# Patient Record
Sex: Female | Born: 1965 | Race: White | Hispanic: No | Marital: Married
Health system: Southern US, Community
[De-identification: ages and names within clinical notes are randomized; demographics above are authoritative.]

## PROBLEM LIST (undated history)

## (undated) DIAGNOSIS — E785 Hyperlipidemia, unspecified: Secondary | ICD-10-CM

## (undated) DIAGNOSIS — R42 Dizziness and giddiness: Secondary | ICD-10-CM

## (undated) DIAGNOSIS — R519 Headache, unspecified: Secondary | ICD-10-CM

## (undated) DIAGNOSIS — M797 Fibromyalgia: Secondary | ICD-10-CM

## (undated) DIAGNOSIS — K589 Irritable bowel syndrome without diarrhea: Secondary | ICD-10-CM

## (undated) DIAGNOSIS — G4700 Insomnia, unspecified: Secondary | ICD-10-CM

## (undated) DIAGNOSIS — I10 Essential (primary) hypertension: Secondary | ICD-10-CM

## (undated) DIAGNOSIS — F419 Anxiety disorder, unspecified: Secondary | ICD-10-CM

## (undated) DIAGNOSIS — J449 Chronic obstructive pulmonary disease, unspecified: Secondary | ICD-10-CM

## (undated) HISTORY — PX: COLONOSCOPY: SHX5424

---

## 1999-04-05 HISTORY — PX: OTHER SURGICAL HISTORY: SHX169

## 2001-02-04 HISTORY — PX: CHOLECYSTECTOMY: SHX55

## 2001-02-04 HISTORY — PX: LAPAROSCOPY: SHX197

## 2019-06-27 DIAGNOSIS — I639 Cerebral infarction, unspecified: Secondary | ICD-10-CM

## 2019-06-27 HISTORY — DX: Cerebral infarction, unspecified: I63.9

## 2019-07-02 ENCOUNTER — Observation Stay (HOSPITAL_COMMUNITY)
Admission: EM | Admit: 2019-07-02 | Discharge: 2019-07-03 | Disposition: A | Discharge status: Home / Self Care | Payer: BC Managed Care – PPO | Attending: Internal Medicine | Admitting: Internal Medicine

## 2019-07-02 ENCOUNTER — Encounter (HOSPITAL_COMMUNITY): Payer: Self-pay | Admitting: Emergency Medicine

## 2019-07-02 ENCOUNTER — Emergency Department (HOSPITAL_COMMUNITY): Payer: BC Managed Care – PPO

## 2019-07-02 ENCOUNTER — Other Ambulatory Visit: Payer: Self-pay

## 2019-07-02 DIAGNOSIS — R55 Syncope and collapse: Secondary | ICD-10-CM

## 2019-07-02 DIAGNOSIS — R079 Chest pain, unspecified: Secondary | ICD-10-CM | POA: Insufficient documentation

## 2019-07-02 DIAGNOSIS — R29898 Other symptoms and signs involving the musculoskeletal system: Secondary | ICD-10-CM

## 2019-07-02 DIAGNOSIS — Z7722 Contact with and (suspected) exposure to environmental tobacco smoke (acute) (chronic): Secondary | ICD-10-CM

## 2019-07-02 DIAGNOSIS — I7774 Dissection of vertebral artery: Principal | ICD-10-CM

## 2019-07-02 DIAGNOSIS — Z20822 Contact with and (suspected) exposure to covid-19: Secondary | ICD-10-CM | POA: Diagnosis not present

## 2019-07-02 DIAGNOSIS — I639 Cerebral infarction, unspecified: Secondary | ICD-10-CM

## 2019-07-02 HISTORY — DX: Fibromyalgia: M79.7

## 2019-07-02 HISTORY — DX: Irritable bowel syndrome, unspecified: K58.9

## 2019-07-02 LAB — DIFFERENTIAL
Abs Immature Granulocytes: 0.01 10*3/uL (ref 0.00–0.07)
Basophils Absolute: 0.1 10*3/uL (ref 0.0–0.1)
Basophils Relative: 1 %
Eosinophils Absolute: 0.1 10*3/uL (ref 0.0–0.5)
Eosinophils Relative: 2 %
Immature Granulocytes: 0 %
Lymphocytes Relative: 38 %
Lymphs Abs: 2.2 10*3/uL (ref 0.7–4.0)
Monocytes Absolute: 0.5 10*3/uL (ref 0.1–1.0)
Monocytes Relative: 9 %
Neutro Abs: 3 10*3/uL (ref 1.7–7.7)
Neutrophils Relative %: 50 %

## 2019-07-02 LAB — COMPREHENSIVE METABOLIC PANEL
ALT: 32 U/L (ref 0–44)
AST: 28 U/L (ref 15–41)
Albumin: 4.3 g/dL (ref 3.5–5.0)
Alkaline Phosphatase: 79 U/L (ref 38–126)
Anion gap: 11 (ref 5–15)
BUN: 5 mg/dL — ABNORMAL LOW (ref 6–20)
CO2: 21 mmol/L — ABNORMAL LOW (ref 22–32)
Calcium: 9.4 mg/dL (ref 8.9–10.3)
Chloride: 107 mmol/L (ref 98–111)
Creatinine, Ser: 0.54 mg/dL (ref 0.44–1.00)
GFR calc Af Amer: >60 mL/min (ref >60)
GFR calc non Af Amer: >60 mL/min (ref >60)
Glucose, Bld: 90 mg/dL (ref 70–99)
Potassium: 3.7 mmol/L (ref 3.5–5.1)
Sodium: 139 mmol/L (ref 135–145)
Total Bilirubin: 1.4 mg/dL — ABNORMAL HIGH (ref 0.3–1.2)
Total Protein: 7.7 g/dL (ref 6.5–8.1)

## 2019-07-02 LAB — TROPONIN I (HIGH SENSITIVITY)
Troponin I (High Sensitivity): 2 ng/L (ref <18)
Troponin I (High Sensitivity): 3 ng/L (ref <18)

## 2019-07-02 LAB — CBC
HCT: 46.2 % — ABNORMAL HIGH (ref 36.0–46.0)
Hemoglobin: 15.4 g/dL — ABNORMAL HIGH (ref 12.0–15.0)
MCH: 30.4 pg (ref 26.0–34.0)
MCHC: 33.3 g/dL (ref 30.0–36.0)
MCV: 91.1 fL (ref 80.0–100.0)
Platelets: 391 10*3/uL (ref 150–400)
RBC: 5.07 MIL/uL (ref 3.87–5.11)
RDW: 11.7 % (ref 11.5–15.5)
WBC: 5.9 10*3/uL (ref 4.0–10.5)
nRBC: 0 % (ref 0.0–0.2)

## 2019-07-02 LAB — HCG, SERUM, QUALITATIVE: Preg, Serum: NEGATIVE

## 2019-07-02 LAB — I-STAT CHEM 8, ED
BUN: 5 mg/dL — ABNORMAL LOW (ref 6–20)
Calcium, Ion: 1.17 mmol/L (ref 1.15–1.40)
Chloride: 107 mmol/L (ref 98–111)
Creatinine, Ser: 0.5 mg/dL (ref 0.44–1.00)
Glucose, Bld: 90 mg/dL (ref 70–99)
HCT: 47 % — ABNORMAL HIGH (ref 36.0–46.0)
Hemoglobin: 16 g/dL — ABNORMAL HIGH (ref 12.0–15.0)
Potassium: 3.6 mmol/L (ref 3.5–5.1)
Sodium: 142 mmol/L (ref 135–145)
TCO2: 21 mmol/L — ABNORMAL LOW (ref 22–32)

## 2019-07-02 LAB — APTT: aPTT: 36 seconds (ref 24–36)

## 2019-07-02 LAB — PROTIME-INR
INR: 1 (ref 0.8–1.2)
Prothrombin Time: 12.6 seconds (ref 11.4–15.2)

## 2019-07-02 LAB — I-STAT BETA HCG BLOOD, ED (MC, WL, AP ONLY): I-stat hCG, quantitative: 5.1 m[IU]/mL — ABNORMAL HIGH (ref <5)

## 2019-07-02 LAB — HIV ANTIBODY (ROUTINE TESTING W REFLEX): HIV Screen 4th Generation wRfx: NONREACTIVE

## 2019-07-02 LAB — SARS CORONAVIRUS 2 BY RT PCR (HOSPITAL ORDER, PERFORMED IN ~~LOC~~ HOSPITAL LAB): SARS Coronavirus 2: NEGATIVE

## 2019-07-02 MED ORDER — METOCLOPRAMIDE HCL 5 MG/ML IJ SOLN
10.0000 mg | Freq: Once | INTRAMUSCULAR | Status: AC
  Administered 2019-07-02: 10 mg via INTRAVENOUS
  Filled 2019-07-02: qty 2

## 2019-07-02 MED ORDER — SODIUM CHLORIDE 0.9 % IV BOLUS
1000.0000 mL | Freq: Once | INTRAVENOUS | Status: AC
  Administered 2019-07-02: 1000 mL via INTRAVENOUS

## 2019-07-02 MED ORDER — CLOPIDOGREL BISULFATE 75 MG PO TABS
75.0000 mg | ORAL_TABLET | Freq: Every day | ORAL | Status: DC
  Administered 2019-07-03: 75 mg via ORAL
  Filled 2019-07-02: qty 1

## 2019-07-02 MED ORDER — ENOXAPARIN SODIUM 40 MG/0.4ML ~~LOC~~ SOLN
40.0000 mg | SUBCUTANEOUS | Status: DC
  Administered 2019-07-02: 40 mg via SUBCUTANEOUS
  Filled 2019-07-02: qty 0.4

## 2019-07-02 MED ORDER — DIPHENHYDRAMINE HCL 50 MG/ML IJ SOLN
25.0000 mg | Freq: Once | INTRAMUSCULAR | Status: AC
  Administered 2019-07-02: 25 mg via INTRAVENOUS
  Filled 2019-07-02: qty 1

## 2019-07-02 MED ORDER — STROKE: EARLY STAGES OF RECOVERY BOOK
Freq: Once | Status: AC
  Administered 2019-07-02: 1
  Filled 2019-07-02: qty 1

## 2019-07-02 MED ORDER — IOHEXOL 350 MG/ML SOLN
75.0000 mL | Freq: Once | INTRAVENOUS | Status: AC | PRN
  Administered 2019-07-02: 75 mL via INTRAVENOUS

## 2019-07-02 MED ORDER — CLOPIDOGREL BISULFATE 75 MG PO TABS
300.0000 mg | ORAL_TABLET | Freq: Once | ORAL | Status: AC
  Administered 2019-07-02: 300 mg via ORAL
  Filled 2019-07-02: qty 4

## 2019-07-02 MED ORDER — ACETAMINOPHEN 160 MG/5ML PO SOLN: 650.0000 mg | ORAL | Status: DC | PRN

## 2019-07-02 MED ORDER — SODIUM CHLORIDE 0.9% FLUSH: 3.0000 mL | Freq: Once | INTRAVENOUS | Status: DC

## 2019-07-02 MED ORDER — ACETAMINOPHEN 650 MG RE SUPP: 650.0000 mg | RECTAL | Status: DC | PRN

## 2019-07-02 MED ORDER — SENNOSIDES-DOCUSATE SODIUM 8.6-50 MG PO TABS: 1.0000 | ORAL_TABLET | Freq: Every evening | ORAL | Status: DC | PRN

## 2019-07-02 MED ORDER — ACETAMINOPHEN 325 MG PO TABS: 650.0000 mg | ORAL_TABLET | ORAL | Status: DC | PRN

## 2019-07-02 MED ORDER — ASPIRIN EC 81 MG PO TBEC
81.0000 mg | DELAYED_RELEASE_TABLET | Freq: Every day | ORAL | Status: DC
  Administered 2019-07-02 – 2019-07-03 (×2): 81 mg via ORAL
  Filled 2019-07-02 (×2): qty 1

## 2019-07-02 MED ORDER — SODIUM CHLORIDE 0.9 % IV SOLN: INTRAVENOUS | Status: DC

## 2019-07-02 NOTE — H&P (Signed)
History and Physical    Alexys Lobello LZJ:[id number] DOB: 1965-05-30 DOA: 07/02/2019  Referring MD/NP/PA: Kennis Carina, MD PCP: Patient, No Pcp Per  Patient coming from: Home  Chief Complaint: Left-sided tingling and weakness  I have personally briefly reviewed patient's old medical records in [facility]   HPI: Kimberly [name] is a 54 y.o. female with medical history significant of fibromyalgia and  IBS presented with complaints of left sided tingling and weakness.  Yesterday while the patient was running at work she reports blacking out and almost falling.  She was able to catch her self before falling, and denies completely losing consciousness.  Since that time she is complaining of having a headache and had a funny tingling sensation on the left side of her face going all the way down to her feet.  Associated symptoms of feeling as though her speech was temporarly slurred, metallic taste in her mouth, chest pains(states more so chronic), and some difficulty walking due to weakness on the left side.   ED Course: In the emergency department patient was noted to have developed 254/104.  Labs significant for hemoglobin 15.4, troponin negative x2, total bilirubin 1.4, but all other labs relatively within normal limits.  Initial CT scan of the head without contrast was negative for any signs of a stroke.  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  Neurology have been consulted and loaded the patient with Plavix.  MRI of the brain was pending.  TRH called to admit.   Review of Systems  Constitutional: Negative for fever.  HENT: Positive for nosebleeds. Negative for ear discharge.   Eyes: Negative for photophobia and pain.  Respiratory: Negative for cough and shortness of breath.   Cardiovascular: Positive for chest pain. Negative for leg swelling.  Gastrointestinal: Negative for abdominal pain,  diarrhea and vomiting.  Genitourinary: Negative for dysuria and hematuria.  Musculoskeletal: Negative for falls and myalgias.  Skin: Negative for itching.  Neurological: Positive for sensory change, speech change, focal weakness and headaches. Negative for loss of consciousness.  Psychiatric/Behavioral: Negative for substance abuse.    Past Medical History:  Diagnosis Date  . Fibromyalgia   . IBS (irritable bowel syndrome)     History reviewed. No pertinent surgical history.   reports that she is a non-smoker but has been exposed to tobacco smoke. She has never used smokeless tobacco. She reports previous alcohol use. She reports that she does not use drugs.  No Known Allergies  History reviewed. No pertinent family history.  Prior to Admission medications   Not on File    Physical Exam:  Constitutional: NAD, calm, comfortable Vitals:   07/02/19 1059 07/02/19 1230 07/02/19 1315  BP: (!) 154/103 (!) 154/104 (!) 153/105  Pulse: 76 77 67  Resp: 16 10 16   Temp: 98.4 F (36.9 C)    TempSrc: Oral    SpO2: 98% 99% 99%  Weight: 83 kg    Height: 5\' 6"  (1.676 m)     Eyes: PERRL, lids and conjunctivae normal ENMT: Mucous membranes are moist. Posterior pharynx clear of any exudate or lesions. Neck: normal, supple, no masses, no thyromegaly Respiratory: clear to auscultation bilaterally, no wheezing, no crackles. Normal respiratory effort. No accessory muscle use.  Cardiovascular: Regular rate and rhythm, no murmurs / rubs / gallops. No extremity edema. 2+ pedal pulses. No carotid bruits.  Abdomen: no tenderness, no masses palpated. No hepatosplenomegaly. Bowel sounds  positive.  Musculoskeletal: no clubbing / cyanosis. No joint deformity upper and lower extremities. Good ROM, no contractures. Normal muscle tone.  Skin: no rashes, lesions, ulcers. No induration Neurologic: CN 2-12 grossly intact. Strength 4/5 on the left upper and lower extremity Psychiatric: Normal judgment and  insight. Alert and oriented x 3. Normal mood.     Labs on Admission: I have personally reviewed following labs and imaging studies  CBC: Recent Labs  Lab 07/02/19 1110 07/02/19 1140  WBC 5.9  --   NEUTROABS 3.0  --   HGB 15.4* 16.0*  HCT 46.2* 47.0*  MCV 91.1  --   PLT 391  --    Basic Metabolic Panel: Recent Labs  Lab 07/02/19 1110 07/02/19 1140  NA 139 142  K 3.7 3.6  CL 107 107  CO2 21*  --   GLUCOSE 90 90  BUN 5* 5*  CREATININE 0.54 0.50  CALCIUM 9.4  --    GFR: Estimated Creatinine Clearance: 88.3 mL/min (by C-G formula based on SCr of 0.5 mg/dL). Liver Function Tests: Recent Labs  Lab 07/02/19 1110  AST 28  ALT 32  ALKPHOS 79  BILITOT 1.4*  PROT 7.7  ALBUMIN 4.3   No results for input(s): LIPASE, AMYLASE in the last 168 hours. No results for input(s): AMMONIA in the last 168 hours. Coagulation Profile: Recent Labs  Lab 07/02/19 1110  INR 1.0   Cardiac Enzymes: No results for input(s): CKTOTAL, CKMB, CKMBINDEX, TROPONINI in the last 168 hours. BNP (last 3 results) No results for input(s): PROBNP in the last 8760 hours. HbA1C: No results for input(s): HGBA1C in the last 72 hours. CBG: No results for input(s): GLUCAP in the last 168 hours. Lipid Profile: No results for input(s): CHOL, HDL, LDLCALC, TRIG, CHOLHDL, LDLDIRECT in the last 72 hours. Thyroid Function Tests: No results for input(s): TSH, T4TOTAL, FREET4, T3FREE, THYROIDAB in the last 72 hours. Anemia Panel: No results for input(s): VITAMINB12, FOLATE, FERRITIN, TIBC, IRON, RETICCTPCT in the last 72 hours. Urine analysis: No results found for: COLORURINE, APPEARANCEUR, LABSPEC, PHURINE, GLUCOSEU, HGBUR, BILIRUBINUR, KETONESUR, PROTEINUR, UROBILINOGEN, NITRITE, LEUKOCYTESUR Sepsis Labs: No results found for this or any previous visit (from the past 240 hour(s)).   Radiological Exams on Admission: CT Angio Head W or Wo Contrast  Result Date: 07/02/2019 CLINICAL DATA:  Syncopal  episode, left-sided weakness EXAM: CT ANGIOGRAPHY HEAD AND NECK TECHNIQUE: Multiplanar CT image reconstructions and MIPs were obtained to evaluate the vascular anatomy. Carotid stenosis measurements (when applicable) are obtained utilizing NASCET criteria, using the distal internal carotid diameter as the denominator. CONTRAST:  34mL OMNIPAQUE IOHEXOL 350 MG/ML SOLN COMPARISON:  None. FINDINGS: CTA NECK Aortic arch: Great vessel origins are patent. Right carotid system: Patent. No measurable stenosis or evidence of dissection. Left carotid system: Patent. No measurable stenosis or evidence of dissection. Vertebral arteries: Patent. Left vertebral artery is dominant. Short segment of right V3 vertebral artery demonstrates a linear filling defect at the level of the right C1 transverse foramen. This is not extend intracranially. Skeleton: Degenerative changes of the cervical spine primarily at C5-C6. Other neck: No mass or adenopathy. Upper chest: No apical lung mass. Review of the MIP images confirms the above findings bero CTA HEAD Anterior circulation: Intracranial internal carotid arteries are patent. Anterior and middle cerebral arteries are patent. Posterior circulation: Intracranial vertebral arteries, basilar artery, and posterior cerebral arteries are patent. Venous sinuses: Patent as allowed by contrast bolus timing. 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 MRI recommended to exclude acute infarct. These results were called by telephone at the time of interpretation on 07/02/2019 at 4:28 pm to provider Dr. Pilar Plate, Who verbally acknowledged these results. Electronically Signed   By: Guadlupe Spanish M.D.   On: 07/02/2019 16:28   CT HEAD WO CONTRAST  Result Date: 07/02/2019 CLINICAL DATA:  Syncope with headache and nausea EXAM: CT HEAD WITHOUT CONTRAST TECHNIQUE: Contiguous axial images  were obtained from the base of the skull through the vertex without intravenous contrast. COMPARISON:  May 19, 2016 FINDINGS: Brain: The ventricles and sulci are normal in size and configuration. There is no intracranial mass, hemorrhage, extra-axial fluid collection, or midline shift. Brain parenchyma appears unremarkable. No evident acute infarct. Vascular: No hyperdense vessel.  No evident vascular calcification. Skull: Bony calvarium appears intact. Sinuses/Orbits: There is mucosal thickening in several ethmoid air cells. Other visualized paranasal sinuses are clear. Orbits appear symmetric bilaterally. Other: Mastoid air cells are clear. There is mild debris in the right external auditory canal. IMPRESSION: Brain parenchyma appears unremarkable.  No mass or hemorrhage. Mucosal thickening noted in several ethmoid air cells. Probable cerumen in the right external auditory canal. Electronically Signed   By: Bretta Bang III M.D.   On: 07/02/2019 12:35   CT Angio Neck W and/or Wo Contrast  Result Date: 07/02/2019 CLINICAL DATA:  Syncopal episode, left-sided weakness EXAM: CT ANGIOGRAPHY HEAD AND NECK TECHNIQUE: Multiplanar CT image reconstructions and MIPs were obtained to evaluate the vascular anatomy. Carotid stenosis measurements (when applicable) are obtained utilizing NASCET criteria, using the distal internal carotid diameter as the denominator. CONTRAST:  58mL OMNIPAQUE IOHEXOL 350 MG/ML SOLN COMPARISON:  None. FINDINGS: CTA NECK Aortic arch: Great vessel origins are patent. Right carotid system: Patent. No measurable stenosis or evidence of dissection. Left carotid system: Patent. No measurable stenosis or evidence of dissection. Vertebral arteries: Patent. Left vertebral artery is dominant. Short segment of right V3 vertebral artery demonstrates a linear filling defect at the level of the right C1 transverse foramen. This is not extend intracranially. Skeleton: Degenerative changes of the  cervical spine primarily at C5-C6. Other neck: No mass or adenopathy. Upper chest: No apical lung mass. Review of the MIP images confirms the above findings bero CTA HEAD Anterior circulation: Intracranial internal carotid arteries are patent. Anterior and middle cerebral arteries are patent. Posterior circulation: Intracranial vertebral arteries, basilar artery, and posterior cerebral arteries are patent. Venous sinuses: Patent as allowed by contrast bolus timing. 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 MRI recommended to exclude acute infarct. These results were called by telephone at the time of interpretation on 07/02/2019 at 4:28 pm to provider Dr. Pilar Plate, Who verbally acknowledged these results. Electronically Signed   By: Guadlupe Spanish M.D.   On: 07/02/2019 16:28   DG Chest Portable 1 View  Result Date: 07/02/2019 CLINICAL DATA:  Chest pain. Near syncope. EXAM: PORTABLE CHEST 1 VIEW COMPARISON:  05/19/2016 FINDINGS: The cardiac silhouette remains near the upper limit of normal in size. The lungs remain mildly hyperexpanded with mild peribronchial thickening. Interval minimal left basilar atelectasis. Unremarkable bones. IMPRESSION: 1. Interval minimal left basilar atelectasis. 2. Stable mild changes of COPD. Electronically Signed   By: Beckie Salts M.D.   On: 07/02/2019 13:25    EKG: Independently reviewed.  Normal sinus rhythm at  80 bpm  Assessment/Plan Vertebral artery dissection:Acute. Patient presented with complaints of headache, left sided numbness, and weakness. Found to have a vertebral dissection at the level of C1 without intracranial extension. MRI negative for any signs of an acute stroke. -Admit to a medical telemetry bed -Neurochecks -Dual antiplatelet therapy -PT/OT to evaluate and treat -Appreciate Neurology will follow-up for any further recommendations     Near syncope: Patient denied any loss of consciousness. -F/u telemetry overnight  Chest pain: Chronic.Troponin negative x2 and ekg without ischemic changes.  Passive smoke exposure: Patient denies history of smoking, but has second hand exposure from husband. Chest xray note mild changes suggest of COPD.  DVT prophylaxis: lovenox Code Status: Full Family Communication:husband up dated at bedside Disposition Plan: Possible discharge home Consults called: Neurology Admission status: Observation  Norval Morton MD [facility] Pager [phone]   If 7PM-7AM, please contact night-coverage [email or web address] Password [facility]  07/02/2019, 6:08 PM

## 2019-07-02 NOTE — ED Provider Notes (Signed)
  Provider Note MRN:  [id number]  Arrival date & time: 07/02/19    ED Course and Medical Decision Making  Assumed care from Dr. Criss Alvine at shift change.  Left arm drift and decreased sensation, CTA imaging revealing vertebral artery dissection on the right.  Consulted neurology, who has evaluated the patient and is providing Plavix, recommending MRI.  Admitted to hospitalist service for further management.  .Critical Care Performed by: Sabas Sous, MD Authorized by: Sabas Sous, MD   Critical care provider statement:    Critical care time (minutes):  35   Critical care was necessary to treat or prevent imminent or life-threatening deterioration of the following conditions: Vertebral artery dissection, concern for acute ischemic stroke.   Critical care was time spent personally by me on the following activities:  Discussions with consultants, evaluation of patient's response to treatment, examination of patient, ordering and performing treatments and interventions, ordering and review of laboratory studies, ordering and review of radiographic studies, pulse oximetry, re-evaluation of patient's condition, obtaining history from patient or surrogate and review of old charts    Final Clinical Impressions(s) / ED Diagnoses     ICD-10-CM   1. Vertebral artery dissection (HCC)  I77.74   2. Acute ischemic stroke (HCC)  I63.9   3. Left arm weakness  R29.898     ED Discharge Orders    None      Discharge Instructions   None     Elmer Sow. Pilar Plate, MD [facility] Emergency Medicine [facility] [email or web address]    Sabas Sous, MD 07/02/19 1816

## 2019-07-02 NOTE — ED Triage Notes (Signed)
Pt states that at work yesterday around 1130 am she had syncopal episode, states that upon waking back up she had a metallic taste in her mouth with some numbness to her tongue and the L side of her face, also endorses pain to L arm and L leg as well as weakness to L arm and L leg. Pt a/ox4, speech clear at this time. Denies visual disturbances. Seen at urgent care where she had weakness on the L side and was sent here for further eval. Grip strength weaker on L side.

## 2019-07-02 NOTE — ED Provider Notes (Addendum)
[facility] EMERGENCY DEPARTMENT Provider Note   CSN: [id number] Arrival date & time: 07/02/19  1056     History Chief Complaint  Patient presents with  . Loss of Consciousness  . Numbness    Kimberly [name] is a 54 y.o. female.  HPI 54 year old female presents with left-sided weakness.  She was at work yesterday where she was standing and on the phone and all of a sudden felt dizzy and fell over on her left side.  Did not hit her head or lose consciousness but she states she was close.  Since that time she has noticed a headache that is at the top of her head.  She has had headaches like this before but never like this scenario.  She also has left-sided weakness including her left arm and leg.  It is diffusely tingly.  She has chronic recurrent chest pain that she states she also had yesterday and today but is not new or worse.  Is like a sharp pain.  Her left face including her cheek and tongue feel numb as well.  She has a metallic taste in her mouth. Pain in chest is about 5/10 and 7/10 in her head.   Past Medical History:  Diagnosis Date  . Fibromyalgia   . IBS (irritable bowel syndrome)     There are no problems to display for this patient.   History reviewed. No pertinent surgical history.   OB History   No obstetric history on file.     No family history on file.  Social History   Tobacco Use  . Smoking status: Not on file  Substance Use Topics  . Alcohol use: Not on file  . Drug use: Not on file    Home Medications Prior to Admission medications   Not on File    Allergies    Patient has no known allergies.  Review of Systems   Review of Systems  Constitutional: Negative for fever.  Cardiovascular: Positive for chest pain.  Gastrointestinal: Positive for nausea. Negative for abdominal pain and vomiting.  Neurological: Positive for dizziness, weakness, numbness and headaches. Negative for syncope.  All other systems reviewed and are  negative.   Physical Exam Updated Vital Signs BP (!) 153/105   Pulse 67   Temp 98.4 F (36.9 C) (Oral)   Resp 16   Ht 5\' 6"  (1.676 m)   Wt 83 kg   SpO2 99%   BMI 29.54 kg/m   Physical Exam Vitals and nursing note reviewed.  Constitutional:      Appearance: She is well-developed.  HENT:     Head: Normocephalic and atraumatic.     Right Ear: External ear normal.     Left Ear: External ear normal.     Nose: Nose normal.  Eyes:     General:        Right eye: No discharge.        Left eye: No discharge.     Extraocular Movements: Extraocular movements intact.     Pupils: Pupils are equal, round, and reactive to light.  Cardiovascular:     Rate and Rhythm: Normal rate and regular rhythm.     Pulses:          Radial pulses are 2+ on the right side and 2+ on the left side.     Heart sounds: Normal heart sounds.  Pulmonary:     Effort: Pulmonary effort is normal.     Breath sounds: Normal breath sounds.  Abdominal:     Palpations: Abdomen is soft.     Tenderness: There is no abdominal tenderness.  Skin:    General: Skin is warm and dry.  Neurological:     Mental Status: She is alert.     Comments: CN 3-12 grossly intact save for decreased sensation over left cheek. 5/5 strength in right upper and lower extremities. 4/5 strength in LUE, LLE.  Diffusely decreased subjective sensation in left arm and leg compared to right  Psychiatric:        Mood and Affect: Mood is not anxious.     ED Results / Procedures / Treatments   Labs (all labs ordered are listed, but only abnormal results are displayed) Labs Reviewed  CBC - Abnormal; Notable for the following components:      Result Value   Hemoglobin 15.4 (*)    HCT 46.2 (*)    All other components within normal limits  COMPREHENSIVE METABOLIC PANEL - Abnormal; Notable for the following components:   CO2 21 (*)    BUN 5 (*)    Total Bilirubin 1.4 (*)    All other components within normal limits  I-STAT CHEM 8, ED -  Abnormal; Notable for the following components:   BUN 5 (*)    TCO2 21 (*)    Hemoglobin 16.0 (*)    HCT 47.0 (*)    All other components within normal limits  I-STAT BETA HCG BLOOD, ED ([facility], [facility], [facility] ONLY) - Abnormal; Notable for the following components:   I-stat hCG, quantitative 5.1 (*)    All other components within normal limits  PROTIME-INR  APTT  DIFFERENTIAL  TROPONIN I (HIGH SENSITIVITY)  TROPONIN I (HIGH SENSITIVITY)    EKG EKG Interpretation  Date/Time:  Friday Jul 02 2019 11:00:29 EDT Ventricular Rate:  80 PR Interval:  170 QRS Duration: 82 QT Interval:  398 QTC Calculation: 459 R Axis:   17 Text Interpretation: Normal sinus rhythm no acute ST/T changes No old tracing to compare Confirmed by Pricilla Loveless [phone]) on 07/02/2019 12:02:25 PM   Radiology CT HEAD WO CONTRAST  Result Date: 07/02/2019 CLINICAL DATA:  Syncope with headache and nausea EXAM: CT HEAD WITHOUT CONTRAST TECHNIQUE: Contiguous axial images were obtained from the base of the skull through the vertex without intravenous contrast. COMPARISON:  May 19, 2016 FINDINGS: Brain: The ventricles and sulci are normal in size and configuration. There is no intracranial mass, hemorrhage, extra-axial fluid collection, or midline shift. Brain parenchyma appears unremarkable. No evident acute infarct. Vascular: No hyperdense vessel.  No evident vascular calcification. Skull: Bony calvarium appears intact. Sinuses/Orbits: There is mucosal thickening in several ethmoid air cells. Other visualized paranasal sinuses are clear. Orbits appear symmetric bilaterally. Other: Mastoid air cells are clear. There is mild debris in the right external auditory canal. IMPRESSION: Brain parenchyma appears unremarkable.  No mass or hemorrhage. Mucosal thickening noted in several ethmoid air cells. Probable cerumen in the right external auditory canal. Electronically Signed   By: Bretta Bang III M.D.   On: 07/02/2019 12:35   DG  Chest Portable 1 View  Result Date: 07/02/2019 CLINICAL DATA:  Chest pain. Near syncope. EXAM: PORTABLE CHEST 1 VIEW COMPARISON:  05/19/2016 FINDINGS: The cardiac silhouette remains near the upper limit of normal in size. The lungs remain mildly hyperexpanded with mild peribronchial thickening. Interval minimal left basilar atelectasis. Unremarkable bones. IMPRESSION: 1. Interval minimal left basilar atelectasis. 2. Stable mild changes of COPD. Electronically Signed   By: Viviann Spare  Joneen Caraway M.D.   On: 07/02/2019 13:25    Procedures Procedures (including critical care time)  Medications Ordered in ED Medications  sodium chloride flush (NS) 0.9 % injection 3 mL (3 mLs Intravenous Not Given 07/02/19 1255)  sodium chloride 0.9 % bolus 1,000 mL (1,000 mLs Intravenous New Bag/Given 07/02/19 1255)  metoCLOPramide (REGLAN) injection 10 mg (10 mg Intravenous Given 07/02/19 1255)  diphenhydrAMINE (BENADRYL) injection 25 mg (25 mg Intravenous Given 07/02/19 1255)    ED Course  I have reviewed the triage vital signs and the nursing notes.  Pertinent labs & imaging results that were available during my care of the patient were reviewed by me and considered in my medical decision making (see chart for details).    MDM Rules/Calculators/A&P                      Exam seems somewhat inconsistent.  There is a little bit of drift on her left upper extremity.  Unclear if this is headache related or a stroke.  Given the near syncope associated with this, will get CT angiography which should get her upper aorta and rule out dissection.  Chest pain is pretty minimal.  Troponin is negative.  Labs otherwise reviewed and are reassuring.  Initial head CT is negative.  The CT angiography has been delayed, if this is unrevealing, she will probably need MRI.  She does have a headache but there was no thunderclap component.  Has had many prior headaches that feels similar to this though with no neuro symptoms.  My suspicion of  subarachnoid hemorrhage is pretty low.  Care transferred to Dr. Sedonia Small. Final Clinical Impression(s) / ED Diagnoses Final diagnoses:  None    Rx / DC Orders ED Discharge Orders    None       Sherwood Gambler, MD 07/02/19 1542    Sherwood Gambler, MD 07/02/19 [phone]

## 2019-07-02 NOTE — ED Notes (Signed)
Patient transported to CT 

## 2019-07-03 ENCOUNTER — Observation Stay (HOSPITAL_BASED_OUTPATIENT_CLINIC_OR_DEPARTMENT_OTHER): Payer: BC Managed Care – PPO

## 2019-07-03 DIAGNOSIS — I5031 Acute diastolic (congestive) heart failure: Secondary | ICD-10-CM

## 2019-07-03 LAB — HEMOGLOBIN A1C
Hgb A1c MFr Bld: 5.3 % (ref 4.8–5.6)
Mean Plasma Glucose: 105.41 mg/dL

## 2019-07-03 LAB — LIPID PANEL
Cholesterol: 253 mg/dL — ABNORMAL HIGH (ref 0–200)
HDL: 51 mg/dL (ref >40)
LDL Cholesterol: 191 mg/dL — ABNORMAL HIGH (ref 0–99)
Total CHOL/HDL Ratio: 5 RATIO
Triglycerides: 53 mg/dL (ref <150)
VLDL: 11 mg/dL (ref 0–40)

## 2019-07-03 LAB — ECHOCARDIOGRAM COMPLETE
Height: 66 in
Weight: 2910.07 oz

## 2019-07-03 MED ORDER — ATORVASTATIN CALCIUM 80 MG PO TABS
80.0000 mg | ORAL_TABLET | Freq: Every day | ORAL | Status: DC
  Administered 2019-07-03: 80 mg via ORAL
  Filled 2019-07-03: qty 1

## 2019-07-03 MED ORDER — ATORVASTATIN CALCIUM 80 MG PO TABS: 80.0000 mg | ORAL_TABLET | Freq: Every day | ORAL | 1 refills | Status: DC

## 2019-07-03 MED ORDER — CLOPIDOGREL BISULFATE 75 MG PO TABS: 75.0000 mg | ORAL_TABLET | Freq: Every day | ORAL | 2 refills | Status: DC

## 2019-07-03 MED ORDER — ASPIRIN 81 MG PO TBEC: 81.0000 mg | DELAYED_RELEASE_TABLET | Freq: Every day | ORAL | 2 refills | Status: DC

## 2019-07-03 NOTE — Progress Notes (Signed)
Discharge instructions gone over with patient, and husband at bedside. All questions answered. IV removed, telemetry discontinued. Patient belongings returned to patient. Patient transported off unit via wheelchair for transport home. Melony Overly, RN

## 2019-07-03 NOTE — Discharge Summary (Signed)
Physician Discharge Summary  Kimberly [name] INO:[id number] DOB: [birth year] DOA: 07/02/2019  PCP: Patient, No Pcp Per  Admit date: 07/02/2019 Discharge date: 07/03/2019  Time spent: 45 minutes  Recommendations for Outpatient Follow-up:  Patient will be discharged to home.  Patient will need to follow up with primary care provider within one week of discharge.  Follow up with neurology. Patient should continue medications as prescribed.  Patient should follow a heart healthy diet.   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 Cecile Sheerer Kimberly [name] a 54 y.o.femalewith medical history significant offibromyalgiaandIBSpresented with complaints of left sided tingling and weakness. Yesterday while the patient was running at work she reports blacking out and almost falling. She was able to catch her self before falling, and denies completely losing consciousness. Since that time she is complaining of having a headache and had a funny tingling sensation on the left side of her face going all the way down to her feet. Associated symptoms of feeling as though her speech was temporarlyslurred, metallic taste in her mouth, chest pains(states more so chronic), andsome difficulty walking due to weakness on the left side.  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  recommended plavix + aspirin for 3 months and outpatient neuro follow up -Patient placed on aspirin, Plavix -LDL 191, hemoglobin A1c 5.3 -Echocardiogram EF 55-60%, LV diastolic parameters were normal -PT recommended outpatient PT -OT recommended no further follow up  Near syncope -Patient denied any loss of consciousness -Secondary to the above  Chest pain -?  Chronic -Troponin unremarkable x2 -EKG without ischemic changes -No chest pain this morning  Secondhand/passive smoke exposure -Patient denies history of smoking but does state that her husband smokes -Chest x-ray shows mild changes suggestive of COPD  Procedures: Echocardiogram  Consultations: Neurology   Discharge Exam: Vitals:   07/03/19 0733 07/03/19 1147  BP: 136/90 (!) 135/96  Pulse: 72 79  Resp: 18 18  Temp: 98.1 F (36.7 C) 97.8 F (36.6 C)  SpO2: 99% 97%     General: Well developed, well nourished, NAD, appears stated age  HEENT: NCAT, mucous membranes moist.  Cardiovascular: S1 S2 auscultated, RRR  Respiratory: Clear to auscultation bilaterally  Abdomen: Soft, nontender, nondistended, + bowel sounds  Extremities: warm dry without cyanosis clubbing or edema  Neuro: AAOx3, nonfocal  Psych: Appropriate mood and affect  Discharge Instructions Discharge Instructions    Ambulatory referral to Physical Therapy   Complete by: As directed    Discharge instructions   Complete by: As directed    Patient will be discharged to home.  Patient will need to follow up with primary care provider within one week of discharge.  Follow up with neurology. Patient should continue medications as prescribed.  Patient should follow a heart healthy diet.     Allergies as of 07/03/2019   No Known Allergies     Medication List    TAKE these medications   aspirin 81 MG EC tablet Take  1 tablet (81 mg total) by mouth daily. Start taking on: Jul 04, 2019   atorvastatin 80 MG tablet Commonly  known as: LIPITOR Take 1 tablet (80 mg total) by mouth daily. Start taking on: Jul 04, 2019   clopidogrel 75 MG tablet Commonly known as: PLAVIX Take 1 tablet (75 mg total) by mouth daily. Start taking on: Jul 04, 2019      No Known Allergies Follow-up Information    Primary care physician. Schedule an appointment as soon as possible for a visit in 1 week(s).   Why: Hospital follow up       Kimberly [name], [name], MD. Schedule an appointment as soon as possible for a visit in 3 week(s).   Specialties: Neurology, Radiology Why: Stroke clinic Contact information: [address] [place] [address] [phone]            The results of significant diagnostics from this hospitalization (including imaging, microbiology, ancillary and laboratory) are listed below for reference.    Significant Diagnostic Studies: CT Angio Head W or Wo Contrast  Result Date: 07/02/2019 CLINICAL DATA:  Syncopal episode, left-sided weakness EXAM: CT ANGIOGRAPHY HEAD AND NECK TECHNIQUE: Multiplanar CT image reconstructions and MIPs were obtained to evaluate the vascular anatomy. Carotid stenosis measurements (when applicable) are obtained utilizing NASCET criteria, using the distal internal carotid diameter as the denominator. CONTRAST:  75mL OMNIPAQUE IOHEXOL 350 MG/ML SOLN COMPARISON:  None. FINDINGS: CTA NECK Aortic arch: Great vessel origins are patent. Right carotid system: Patent. No measurable stenosis or evidence of dissection. Left carotid system: Patent. No measurable stenosis or evidence of dissection. Vertebral arteries: Patent. Left vertebral artery is dominant. Short segment of right V3 vertebral artery demonstrates a linear filling defect at the level of the right C1 transverse foramen. This is not extend intracranially. Skeleton: Degenerative changes of the cervical spine primarily at C5-C6. Other neck: No mass or adenopathy. Upper chest: No apical lung mass. Review of the MIP images  confirms the above findings bero CTA HEAD Anterior circulation: Intracranial internal carotid arteries are patent. Anterior and middle cerebral arteries are patent. Posterior circulation: Intracranial vertebral arteries, basilar artery, and posterior cerebral arteries are patent. Venous sinuses: Patent as allowed by contrast bolus timing. 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 MRI recommended to exclude acute infarct. These results were called by telephone at the time of interpretation on 07/02/2019 at 4:28 pm to provider Dr. Pilar PlateBero, Who verbally acknowledged these results. Electronically Signed   By: Guadlupe SpanishPraneil  Patel M.D.   On: 07/02/2019 16:28   CT HEAD WO CONTRAST  Result Date: 07/02/2019 CLINICAL DATA:  Syncope with headache and nausea EXAM: CT HEAD WITHOUT CONTRAST TECHNIQUE: Contiguous axial images were obtained from the base of the skull through the vertex without intravenous contrast. COMPARISON:  May 19, 2016 FINDINGS: Brain: The ventricles and sulci are normal in size and configuration. There is no intracranial mass, hemorrhage, extra-axial fluid collection, or midline shift. Brain parenchyma appears unremarkable. No evident acute infarct. Vascular: No hyperdense vessel.  No evident vascular calcification. Skull: Bony calvarium appears intact. Sinuses/Orbits: There is mucosal thickening in several ethmoid air cells. Other visualized paranasal sinuses are clear. Orbits appear symmetric bilaterally. Other: Mastoid air cells are clear. There is mild debris in the right external auditory canal. IMPRESSION: Brain parenchyma appears unremarkable.  No mass or hemorrhage. Mucosal thickening noted in several ethmoid air cells. Probable  cerumen in the right external auditory canal. Electronically Signed   By: Bretta Bang III M.D.   On: 07/02/2019 12:35   CT Angio Neck W and/or Wo  Contrast  Result Date: 07/02/2019 CLINICAL DATA:  Syncopal episode, left-sided weakness EXAM: CT ANGIOGRAPHY HEAD AND NECK TECHNIQUE: Multiplanar CT image reconstructions and MIPs were obtained to evaluate the vascular anatomy. Carotid stenosis measurements (when applicable) are obtained utilizing NASCET criteria, using the distal internal carotid diameter as the denominator. CONTRAST:  25mL OMNIPAQUE IOHEXOL 350 MG/ML SOLN COMPARISON:  None. FINDINGS: CTA NECK Aortic arch: Great vessel origins are patent. Right carotid system: Patent. No measurable stenosis or evidence of dissection. Left carotid system: Patent. No measurable stenosis or evidence of dissection. Vertebral arteries: Patent. Left vertebral artery is dominant. Short segment of right V3 vertebral artery demonstrates a linear filling defect at the level of the right C1 transverse foramen. This is not extend intracranially. Skeleton: Degenerative changes of the cervical spine primarily at C5-C6. Other neck: No mass or adenopathy. Upper chest: No apical lung mass. Review of the MIP images confirms the above findings bero CTA HEAD Anterior circulation: Intracranial internal carotid arteries are patent. Anterior and middle cerebral arteries are patent. Posterior circulation: Intracranial vertebral arteries, basilar artery, and posterior cerebral arteries are patent. Venous sinuses: Patent as allowed by contrast bolus timing. 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 MRI recommended to exclude acute infarct. These results were called by telephone at the time of interpretation on 07/02/2019 at 4:28 pm to provider Dr. Pilar Plate, Who verbally acknowledged these results. Electronically Signed   By: Guadlupe Spanish M.D.   On: 07/02/2019 16:28   MR BRAIN WO CONTRAST  Result Date: 07/02/2019 CLINICAL DATA:  Right vertebral artery dissection.  Rule out stroke left arm weakness. EXAM: MRI HEAD WITHOUT CONTRAST TECHNIQUE: Multiplanar, multiecho pulse sequences of the brain and surrounding structures were obtained without intravenous contrast. COMPARISON:  CTA head neck earlier today FINDINGS: Brain: Negative for acute infarct. Few small deep white matter hyperintensities bilaterally most likely chronic ischemia. Negative for hemorrhage or mass. Ventricle size and cerebral volume normal. Vascular: Normal arterial flow voids Skull and upper cervical spine: No focal skeletal lesion. Sinuses/Orbits: Mild mucosal edema paranasal sinuses. Negative orbit Other: None IMPRESSION: Negative for acute infarct. Mild chronic white matter changes most likely due to small vessel ischemia. Electronically Signed   By: Marlan Palau M.D.   On: 07/02/2019 18:18   DG Chest Portable 1 View  Result Date: 07/02/2019 CLINICAL DATA:  Chest pain. Near syncope. EXAM: PORTABLE CHEST 1 VIEW COMPARISON:  05/19/2016 FINDINGS: The cardiac silhouette remains near the upper limit of normal in size. The lungs remain mildly hyperexpanded with mild peribronchial thickening. Interval minimal left basilar atelectasis. Unremarkable bones. IMPRESSION: 1. Interval minimal left basilar atelectasis. 2. Stable mild changes of COPD. Electronically Signed   By: Beckie Salts M.D.   On: 07/02/2019 13:25   ECHOCARDIOGRAM COMPLETE  Result Date: 07/03/2019    ECHOCARDIOGRAM REPORT   Patient Name:   Kimberly [name] Date of Exam: 07/03/2019 Medical Rec #:  [id number]    Height:       66.0 in Accession #:    [id number]   Weight:       181.9 lb Date of Birth:  [birth year]    BSA:          1.921 m Patient Age:  53 years     BP:           136/90 mmHg Patient Gender: F            HR:           72 bpm. Exam Location:  Inpatient Procedure: 2D Echo, Cardiac Doppler and Color Doppler Indications:    CHF-Acute Diastolic  History:        Patient has no prior history of Echocardiogram examinations.                  Near syncope.  Sonographer:    Clayton Lefort RDCS (AE) Referring Phys: [id number] RONDELL A SMITH IMPRESSIONS  1. Left ventricular ejection fraction, by estimation, is 55 to 60%. The left ventricle has normal function. The left ventricle has no regional wall motion abnormalities. Left ventricular diastolic parameters were normal.  2. Right ventricular systolic function is normal. The right ventricular size is normal. There is normal pulmonary artery systolic pressure. The estimated right ventricular systolic pressure is 71.6 mmHg.  3. The mitral valve is grossly normal. No evidence of mitral valve regurgitation. No evidence of mitral stenosis.  4. The aortic valve is tricuspid. Aortic valve regurgitation is not visualized. No aortic stenosis is present.  5. The inferior vena cava is normal in size with greater than 50% respiratory variability, suggesting right atrial pressure of 3 mmHg. Conclusion(s)/Recommendation(s): Normal biventricular function without evidence of hemodynamically significant valvular heart disease. FINDINGS  Left Ventricle: Left ventricular ejection fraction, by estimation, is 55 to 60%. The left ventricle has normal function. The left ventricle has no regional wall motion abnormalities. The left ventricular internal cavity size was normal in size. There is  no left ventricular hypertrophy. Left ventricular diastolic parameters were normal. Right Ventricle: The right ventricular size is normal. No increase in right ventricular wall thickness. Right ventricular systolic function is normal. There is normal pulmonary artery systolic pressure. The tricuspid regurgitant velocity is 2.00 m/s, and  with an assumed right atrial pressure of 3 mmHg, the estimated right ventricular systolic pressure is 96.7 mmHg. Left Atrium: Left atrial size was normal in size. Right Atrium: Right atrial size was normal in size. Pericardium: Trivial pericardial effusion is present. Presence of pericardial fat pad. Mitral Valve:  The mitral valve is grossly normal. No evidence of mitral valve regurgitation. No evidence of mitral valve stenosis. MV peak gradient, 3.1 mmHg. The mean mitral valve gradient is 2.0 mmHg. Tricuspid Valve: The tricuspid valve is grossly normal. Tricuspid valve regurgitation is not demonstrated. No evidence of tricuspid stenosis. Aortic Valve: The aortic valve is tricuspid. Aortic valve regurgitation is not visualized. No aortic stenosis is present. Aortic valve mean gradient measures 2.0 mmHg. Aortic valve peak gradient measures 3.0 mmHg. Aortic valve area, by VTI measures 3.71 cm. Pulmonic Valve: The pulmonic valve was grossly normal. Pulmonic valve regurgitation is not visualized. No evidence of pulmonic stenosis. Aorta: The aortic root is normal in size and structure. Venous: The inferior vena cava is normal in size with greater than 50% respiratory variability, suggesting right atrial pressure of 3 mmHg. IAS/Shunts: The atrial septum is grossly normal.  LEFT VENTRICLE PLAX 2D LVIDd:         4.40 cm  Diastology LVIDs:         3.00 cm  LV e' lateral:   8.05 cm/s LV PW:         1.20 cm  LV E/e' lateral: 8.9 LV IVS:  1.00 cm  LV e' medial:    7.40 cm/s LVOT diam:     2.10 cm  LV E/e' medial:  9.7 LV SV:         64 LV SV Index:   33 LVOT Area:     3.46 cm  RIGHT VENTRICLE             IVC RV Basal diam:  3.40 cm     IVC diam: 1.60 cm RV S prime:     10.20 cm/s TAPSE (M-mode): 1.8 cm LEFT ATRIUM             Index       RIGHT ATRIUM           Index LA diam:        3.00 cm 1.56 cm/m  RA Area:     17.20 cm LA Vol (A2C):   37.3 ml 19.42 ml/m RA Volume:   51.70 ml  26.92 ml/m LA Vol (A4C):   20.0 ml 10.41 ml/m LA Biplane Vol: 27.8 ml 14.47 ml/m  AORTIC VALVE AV Area (Vmax):    3.40 cm AV Area (Vmean):   3.48 cm AV Area (VTI):     3.71 cm AV Vmax:           86.60 cm/s AV Vmean:          60.100 cm/s AV VTI:            0.172 m AV Peak Grad:      3.0 mmHg AV Mean Grad:      2.0 mmHg LVOT Vmax:         84.90  cm/s LVOT Vmean:        60.300 cm/s LVOT VTI:          0.184 m LVOT/AV VTI ratio: 1.07  AORTA Ao Root diam: 3.40 cm Ao Asc diam:  3.60 cm MITRAL VALVE               TRICUSPID VALVE MV Area (PHT): 3.89 cm    TR Peak grad:   16.0 mmHg MV Peak grad:  3.1 mmHg    TR Vmax:        200.00 cm/s MV Mean grad:  2.0 mmHg MV Vmax:       0.88 m/s    SHUNTS MV Vmean:      62.0 cm/s   Systemic VTI:  0.18 m MV Decel Time: 195 msec    Systemic Diam: 2.10 cm MV E velocity: 71.60 cm/s MV A velocity: 80.60 cm/s MV E/A ratio:  0.89 Lennie Odor MD Electronically signed by Lennie Odor MD Signature Date/Time: 07/03/2019/1:58:53 PM    Final     Microbiology: Recent Results (from the past 240 hour(s))  SARS Coronavirus 2 by RT PCR (hospital order, performed in [facility] hospital lab) Nasopharyngeal Nasopharyngeal Swab     Status: None   Collection Time: 07/02/19  6:11 PM   Specimen: Nasopharyngeal Swab  Result Value Ref Range Status   SARS Coronavirus 2 NEGATIVE NEGATIVE Final    Comment: (NOTE) SARS-CoV-2 target nucleic acids are NOT DETECTED. The SARS-CoV-2 RNA is generally detectable in upper and lower respiratory specimens during the acute phase of infection. The lowest concentration of SARS-CoV-2 viral copies this assay can detect is 250 copies / mL. A negative result does not preclude SARS-CoV-2 infection and should not be used as the sole basis for treatment or other patient management decisions.  A negative result may occur with improper  specimen collection / handling, submission of specimen other than nasopharyngeal swab, presence of viral mutation(s) within the areas targeted by this assay, and inadequate number of viral copies (<250 copies / mL). A negative result must be combined with clinical observations, patient history, and epidemiological information. Fact Sheet for Patients:   BoilerBrush.com.cy Fact Sheet for Healthcare  Providers: https://pope.com/ This test is not yet approved or cleared  by the [place] FDA and has been authorized for detection and/or diagnosis of SARS-CoV-2 by FDA under an Emergency Use Authorization (EUA).  This EUA will remain in effect (meaning this test can be used) for the duration of the COVID-19 declaration under Section 564(b)(1) of the Act, 21 U.S.C. section 360bbb-3(b)(1), unless the authorization is terminated or revoked sooner. Performed at [facility], [address] [place], [place] [address]      Labs: Basic Metabolic Panel: Recent Labs  Lab 07/02/19 1110 07/02/19 1140  NA 139 142  K 3.7 3.6  CL 107 107  CO2 21*  --   GLUCOSE 90 90  BUN 5* 5*  CREATININE 0.54 0.50  CALCIUM 9.4  --    Liver Function Tests: Recent Labs  Lab 07/02/19 1110  AST 28  ALT 32  ALKPHOS 79  BILITOT 1.4*  PROT 7.7  ALBUMIN 4.3   No results for input(s): LIPASE, AMYLASE in the last 168 hours. No results for input(s): AMMONIA in the last 168 hours. CBC: Recent Labs  Lab 07/02/19 1110 07/02/19 1140  WBC 5.9  --   NEUTROABS 3.0  --   HGB 15.4* 16.0*  HCT 46.2* 47.0*  MCV 91.1  --   PLT 391  --    Cardiac Enzymes: No results for input(s): CKTOTAL, CKMB, CKMBINDEX, TROPONINI in the last 168 hours. BNP: BNP (last 3 results) No results for input(s): BNP in the last 8760 hours.  ProBNP (last 3 results) No results for input(s): PROBNP in the last 8760 hours.  CBG: No results for input(s): GLUCAP in the last 168 hours.     Signed:  Edsel Petrin  [facility] 07/03/2019, 2:53 PM

## 2019-07-03 NOTE — Progress Notes (Signed)
Occupational Therpy PRogress Note  Pt provided with and instructed in HEP for Lt UE to improve isolated movement Lt UE as well as FMC.  She demonstrated independence.  Instructed her to sit to shower initially - she verbally agreed.    07/03/19 1500  OT Visit Information  Last OT Received On 07/03/19  Assistance Needed +1  Precautions  Precautions Fall  Pain Assessment  Pain Assessment No/denies pain  Cognition  Arousal/Alertness Awake/alert  Behavior During Therapy WFL for tasks assessed/performed  Overall Cognitive Status Within Functional Limits for tasks assessed  Upper Extremity Assessment  Upper Extremity Assessment Generalized weakness  Other Exercises  Other Exercises Pt was instructed in HEP for Lt UE with focus on isolated shoulder flexion and abduction as well as FMC of Lt hand.  She was able to return demonstration   OT - End of Session  Activity Tolerance Patient tolerated treatment well  Patient left in bed;with call bell/phone within reach;with family/visitor present  OT Assessment/Plan  OT Plan Discharge plan remains appropriate  OT Visit Diagnosis Muscle weakness (generalized) (M62.81)  OT Frequency (ACUTE ONLY) Min 2X/week  Follow Up Recommendations No OT follow up;Supervision - Intermittent  OT Equipment Tub/shower seat  AM-PAC OT "6 Clicks" Daily Activity Outcome Measure (Version 2)  Help from another person eating meals? 4  Help from another person taking care of personal grooming? 3  Help from another person toileting, which includes using toliet, bedpan, or urinal? 3  Help from another person bathing (including washing, rinsing, drying)? 3  Help from another person to put on and taking off regular upper body clothing? 3  Help from another person to put on and taking off regular lower body clothing? 3  6 Click Score 19  OT Goal Progression  Progress towards OT goals Progressing toward goals  OT Time Calculation  OT Start Time (ACUTE ONLY) 1447  OT Stop  Time (ACUTE ONLY) 1503  OT Time Calculation (min) 16 min  OT General Charges  $OT Visit 1 Visit  OT Treatments  $Neuromuscular Re-education 8-22 mins  Eber Jones., OTR/L Acute Rehabilitation Services Pager [phone] Office [phone]

## 2019-07-03 NOTE — Consult Note (Signed)
Neurology Consultation Reason for Consult: Left-sided numbness Referring Physician: Tamala Julian, R  CC: Left-sided numbness  History is obtained from: Patient  HPI: Kimberly [name] is a 54 y.o. female was in her normal state of health when yesterday she had sudden onset vertigo mediately followed by nausea and vomiting.  She states that she was standing at a counter and suddenly the counter felt like it was collapsing and she had to hold onto something to keep from falling.  She did not fall, and with symptoms improved over a few minutes.  Since that time, she has had persistent left-sided numbness and mild weakness.  She finished out her shift at [employer] after this event yesterday but then sought care today when she still had some symptoms.  She states that her symptoms are improving.  She states that she has not had any recent injury to her neck, but she did wake up feeling like she had "slept on her neck wrong" 2 days ago.  In the emergency department, she was evaluated and a CTA showed a vertebral dissection.  LKW: 4/27 11:30 AM tpa given?: no, outside of window   ROS: A 14 point ROS was performed and is negative except as noted in the HPI.   Past Medical History:  Diagnosis Date  . Fibromyalgia   . IBS (irritable bowel syndrome)      History reviewed. No pertinent family history.   Social History:  reports that she is a non-smoker but has been exposed to tobacco smoke. She has never used smokeless tobacco. She reports previous alcohol use. She reports that she does not use drugs.   Exam: Current vital signs: BP 135/83 (BP Location: Right Arm)   Pulse 74   Temp 97.8 F (36.6 C) (Oral)   Resp 18   Ht 5\' 6"  (1.676 m)   Wt 82.5 kg   SpO2 99%   BMI 29.36 kg/m  Vital signs in last 24 hours: Temp:  [97.8 F (36.6 C)-98.4 F (36.9 C)] 97.8 F (36.6 C) (05/28 2355) Pulse Rate:  [67-77] 74 (05/28 2355) Resp:  [10-18] 18 (05/28 2355) BP: (135-154)/(83-105) 135/83 (05/28 2355) SpO2:   [98 %-99 %] 99 % (05/28 2043) Weight:  [82.5 kg-83 kg] 82.5 kg (05/28 2043)   Physical Exam  Constitutional: Appears well-developed and well-nourished.  Psych: Affect appropriate to situation Eyes: No scleral injection HENT: No OP obstrucion MSK: no joint deformities.  Cardiovascular: Normal rate and regular rhythm.  Respiratory: Effort normal, non-labored breathing GI: Soft.  No distension. There is no tenderness.  Skin: WDI  Neuro: Mental Status: Patient is awake, alert, oriented to person, place, month, year, and situation. Patient is able to give a clear and coherent history. No signs of aphasia or neglect Cranial Nerves: II: Visual Fields are full. Pupils are equal, round, and reactive to light.   III,IV, VI: EOMI without ptosis or diploplia.  V: Facial sensation is diminished on the left VII: Facial movement is symmetric.  VIII: hearing is intact to voice X: Uvula elevates symmetrically XI: Shoulder shrug is symmetric. XII: tongue is midline without atrophy or fasciculations.  Motor: Tone is normal. Bulk is normal.  4/5 strength of the left arm and leg with pronator drift on the left. Sensory: Sensation is diminished on the left  Cerebellar: FNF and HKS are intact bilaterally   I have reviewed labs in epic and the results pertinent to this consultation are: CMP-unremarkable  I have reviewed the images obtained: CT/CTA-vertebral dissection, MRI-negative for stroke  Impression: 54 year old female with abrupt onset nausea/vertigo/left-sided numbness with resolution of most of her symptoms, but with persistent mild left weakness and numbness.  Though her MRI is negative, the presence of the vertebral dissection on CTA and symptoms consistent with posterior circulation disease make me suspect that this does represent an MRI negative stroke.  I would favor treating this as a symptomatic carotid dissection and starting dual antiplatelet therapy at this time.  Unclear  precipitant, so essentially this is a spontaneous vert dissection.  Recommendations: - HgbA1c, fasting lipid panel - Frequent neuro checks - Echocardiogram - Prophylactic therapy-Antiplatelet med: Aspirin -81 mg and Plavix 75 mg daily following 300 mg load - Risk factor modification - Telemetry monitoring - PT consult, OT consult, Speech consult - Stroke team to follow  Ritta Slot, MD [facility] [phone]  If 7pm- 7am, please page neurology on call as listed in AMION.

## 2019-07-03 NOTE — Evaluation (Signed)
Speech Language Pathology Evaluation Patient Details Name: Kimberly [name] MRN: [id number] DOB: [birth year] Today's Date: 07/03/2019 Time: 0109-3235 SLP Time Calculation (min) (ACUTE ONLY): 22 min  Problem List:  Patient Active Problem List   Diagnosis Date Noted  . 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 Near syncope 07/02/2019  . Passive smoke exposure 07/02/2019   Past Medical History:  Past Medical History:  Diagnosis Date  . Fibromyalgia   . IBS (irritable bowel syndrome)    Past Surgical History: History reviewed. No pertinent surgical history. HPI:  54 y.o. female with medical history significant of fibromyalgia and  IBS presented with complaints of left sided tingling and weakness.  Per patient report, she was standing at work on Thursday (07/01/19) and reports blacking out and almost falling.  She was able to catch her self before falling, and denies completely losing consciousness.  Since that time she has complained of having a headache and had a funny tingling sensation on the left side of her face going all the way down to her feet (initially when admitted, has resolved)  Associated symptoms of feeling as though her speech was temporarly slurred, metallic taste in her mouth, chest pains(states more so chronic), and some difficulty walking due to weakness on the left side; these initial sensations have subsided per pt report.  SLE generated d/t the above symptoms described by pt;  MRI head indicated Negative for acute infarct. Mild chronic white matter changes most likely due to small vessel ischemia; noted right vertebral artery dissection.  Assessment / Plan / Recommendation Clinical Impression  Pt scored a 28/30 on Montreal Cognitive Assessment with cues required for recall of 2/5 words only; all other areas of cognition appear WFL; speech 100% intelligible within complex conversation; auditory comprehension tasks [facility]; pt aware of safety precautions (fall) while in hospital  and s/s of stroke when questioned; no ST f/u recommended at this time d/t pt functioning within normal limits on MOCA.  Thank you for this consult.    SLP Assessment  SLP Recommendation/Assessment: Patient does not need any further Speech Language Pathology Services SLP Visit Diagnosis: Cognitive communication deficit (R41.841)    Follow Up Recommendations  None    Frequency and Duration   Evaluation only        SLP Evaluation Cognition  Overall Cognitive Status: Within Functional Limits for tasks assessed Arousal/Alertness: Awake/alert Orientation Level: Oriented X4 Memory: Appears intact Memory Recall Sock: Without Cue Memory Recall Blue: Without Cue Memory Recall Bed: Without Cue Awareness: Appears intact Problem Solving: Appears intact Safety/Judgment: Appears intact Comments: Pt stated she had difficulty with reading comprehension in past in certain situations; "visual learner"       Comprehension  Auditory Comprehension Overall Auditory Comprehension: Appears within functional limits for tasks assessed Yes/No Questions: Within Functional Limits Commands: Within Functional Limits Conversation: Complex Visual Recognition/Discrimination Discrimination: Within Function Limits Reading Comprehension Reading Status: Within funtional limits    Expression Expression Primary Mode of Expression: Verbal Verbal Expression Overall Verbal Expression: Appears within functional limits for tasks assessed Initiation: No impairment Level of Generative/Spontaneous Verbalization: Conversation Repetition: No impairment Naming: No impairment Pragmatics: No impairment Non-Verbal Means of Communication: Not applicable Written Expression Dominant Hand: Right Written Expression: Within Functional Limits   Oral / Motor  Oral Motor/Sensory Function Overall Oral Motor/Sensory Function: Within functional limits Motor Speech Overall Motor Speech: Appears within functional limits for  tasks assessed Respiration: Within functional limits Phonation: Normal Resonance: Within functional limits Articulation: Within functional limitis Intelligibility: Intelligible Motor Planning:  Witnin functional limits Motor Speech Errors: Not applicable                       Elvina Sidle, M.S., [place] 07/03/2019, 11:50 AM

## 2019-07-03 NOTE — Plan of Care (Signed)
Discussed with patient plan of care for the evening, pain management and admission question and procedures with some teach back displayed

## 2019-07-03 NOTE — Evaluation (Signed)
Physical Therapy Evaluation Patient Details Name: Kimberly [name] MRN: [id number] DOB: 1965/08/16 Today's Date: 07/03/2019   History of Present Illness  54 y.o. female with medical history significant of fibromyalgia and  IBS presented with complaints of left sided tingling and weakness, speech slurred, vertigo, nausea and vomiting. 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 MRI brain negative for stroke. Per neuro consult, "would favor treating this as a symptomatic carotid dissection."  Clinical Impression   Pt admitted with above diagnosis. 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 Patient with weakness of left extremities with cautious gait with minguard assist for safety. She was able to ascend/descend 5 steps with rails with minguard assist.  Pt currently with functional limitations due to the deficits listed below (see PT Problem List). Pt will benefit from skilled PT to increase their independence and safety with mobility to allow discharge to the venue listed below.       Follow Up Recommendations Outpatient PT;Supervision for mobility/OOB    Equipment Recommendations  None recommended by PT    Recommendations for Other Services       Precautions / Restrictions Precautions Precautions: Fall Restrictions Weight Bearing Restrictions: No      Mobility  Bed Mobility Overal bed mobility: Modified Independent                Transfers Overall transfer level: Needs assistance Equipment used: None Transfers: Sit to/from Stand Sit to Stand: Min guard         General transfer comment: close guarding due to LLE weakness, no imbalance  Ambulation/Gait Ambulation/Gait assistance: Min guard Gait Distance (Feet): 180 Feet Assistive device: None Gait Pattern/deviations: Step-through pattern;Decreased stride length;Narrow base of support;Decreased  dorsiflexion - left Gait velocity: decr Gait velocity interpretation: 1.31 - 2.62 ft/sec, indicative of limited community ambulator General Gait Details: appears guarded, unable to increase velocity, vc for looking forward and not down at her feet.   Stairs Stairs: Yes Stairs assistance: Min guard Stair Management: Two rails;Step to pattern;Forwards Number of Stairs: 5 General stair comments: able to ascend with LLE, feels more secure descending with LLE first  Wheelchair Mobility    Modified Rankin (Stroke Patients Only) Modified Rankin (Stroke Patients Only) Pre-Morbid Rankin Score: No symptoms Modified Rankin: Moderately severe disability     Balance Overall balance assessment: Needs assistance Sitting-balance support: Single extremity supported Sitting balance-Leahy Scale: Good     Standing balance support: No upper extremity supported Standing balance-Leahy Scale: Fair Standing balance comment: able to maintain static standing.                  Standardized Balance Assessment Standardized Balance Assessment : Berg Balance Test;Dynamic Gait Index Berg Balance Test Sit to Stand: Able to stand  independently using hands Standing Unsupported: Able to stand safely 2 minutes Sitting with Back Unsupported but Feet Supported on Floor or Stool: Able to sit safely and securely 2 minutes Stand to Sit: Sits safely with minimal use of hands Transfers: Able to transfer safely, definite need of hands Standing Unsupported with Eyes Closed: Able to stand 10 seconds with supervision Standing Ubsupported with Feet Together: Able to place feet together independently but unable to hold for 30 seconds From Standing, Reach Forward with Outstretched Arm: Can reach forward >12 cm safely (5") From Standing Position, Pick up Object from Floor: Able to pick up shoe, needs supervision From Standing Position,  Turn to Look Behind Over each Shoulder: Turn sideways only but maintains  balance Turn 360 Degrees: Able to turn 360 degrees safely but slowly Standing Unsupported, Alternately Place Feet on Step/Stool: Able to complete >2 steps/needs minimal assist Standing Unsupported, One Foot in Front: Able to plae foot ahead of the other independently and hold 30 seconds Standing on One Leg: Tries to lift leg/unable to hold 3 seconds but remains standing independently Total Score: 38 Dynamic Gait Index Level Surface: Mild Impairment Change in Gait Speed: Moderate Impairment Gait with Horizontal Head Turns: Mild Impairment Gait with Vertical Head Turns: Severe Impairment(not tested due to vertebral dissection) Gait and Pivot Turn: Mild Impairment Step Over Obstacle: Mild Impairment Step Around Obstacles: Mild Impairment Steps: Moderate Impairment Total Score: 12       Pertinent Vitals/Pain Pain Assessment: No/denies pain    Home Living Family/patient expects to be discharged to:: Private residence Living Arrangements: Spouse/significant other Available Help at Discharge: Family;Available 24 hours/day Type of Home: House Home Access: Stairs to enter Entrance Stairs-Rails: Doctor, general practice of Steps: 6 Home Layout: One level Home Equipment: None      Prior Function Level of Independence: Independent         Comments: 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      Hand Dominance   Dominant Hand: Right    Extremity/Trunk Assessment   Upper Extremity Assessment Upper Extremity Assessment: Defer to OT evaluation LUE Deficits / Details: Lt UE movement in Brunnstrom stage 4 initially, but after exercises/neuromuscular reeducation, pt demonstrated movement in Brunnstrom end stage 5  LUE Coordination: decreased gross motor;decreased fine motor    Lower Extremity Assessment Lower Extremity Assessment: LLE deficits/detail LLE Deficits / Details: initial strength ankle and knee 3- to 3/5; with repeated movements and  resistance improved to 4/5 LLE Sensation: decreased light touch    Cervical / Trunk Assessment Cervical / Trunk Assessment: Normal  Communication   Communication: No difficulties  Cognition Arousal/Alertness: Awake/alert Behavior During Therapy: WFL for tasks assessed/performed Overall Cognitive Status: Within Functional Limits for tasks assessed                                 General Comments: Pt scored 0/28 on the Short Blessed Test (no errors)      General Comments General comments (skin integrity, edema, etc.): Pt with left beating nystagmus when turns head to look left and reports vision changes (things look like they are jumping sideways). Educated to avoid turning head to left endrange    Exercises Other Exercises Other Exercises: Pt performed 10 reps shoulder flexion with prayer hands and achieving full elbow extension to improve isolated movement of Lt UE.  then worked on overhead reach with Lt UE with focus on good alignment of shoulder.    Assessment/Plan    PT Assessment Patient needs continued PT services  PT Problem List Decreased strength;Decreased balance;Decreased mobility;Decreased knowledge of use of DME;Impaired sensation       PT Treatment Interventions DME instruction;Gait training;Stair training;Functional mobility training;Therapeutic activities;Therapeutic exercise;Balance training;Neuromuscular re-education;Patient/family education    PT Goals (Current goals can be found in the Care Plan section)  Acute Rehab PT Goals Patient Stated Goal: to get back to normal  PT Goal Formulation: With patient Time For Goal Achievement: 07/17/19 Potential to Achieve Goals: Good    Frequency Min 4X/week   Barriers to discharge        Co-evaluation  AM-PAC PT "6 Clicks" Mobility  Outcome Measure Help needed turning from your back to your side while in a flat bed without using bedrails?: None Help needed moving from lying on  your back to sitting on the side of a flat bed without using bedrails?: None Help needed moving to and from a bed to a chair (including a wheelchair)?: None Help needed standing up from a chair using your arms (e.g., wheelchair or bedside chair)?: None Help needed to walk in hospital room?: A Little Help needed climbing 3-5 steps with a railing? : A Little 6 Click Score: 22    End of Session Equipment Utilized During Treatment: Gait belt Activity Tolerance: Patient tolerated treatment well Patient left: in bed;with call bell/phone within reach;with bed alarm set;with family/visitor present(for echo test) Nurse Communication: Mobility status PT Visit Diagnosis: Other abnormalities of gait and mobility (R26.89);Muscle weakness (generalized) (M62.81)    Time: 4287-6811 PT Time Calculation (min) (ACUTE ONLY): 19 min   Charges:   PT Evaluation $PT Eval Low Complexity: 1 Low           Arby Barrette, PT Pager [phone]   Rexanne Mano 07/03/2019, 2:17 PM

## 2019-07-03 NOTE — Discharge Instructions (Signed)
Ischemic Stroke  An ischemic stroke is the sudden death of brain tissue. Blood carries oxygen to all areas of the body. This type of stroke happens when your blood does not flow to your brain like normal. Your brain cannot get the oxygen it needs. This is an emergency. It must be treated right away. Symptoms of a stroke usually happen all of a sudden. You may notice them when you wake up. They can include:  Weakness or loss of feeling in your face, arm, or leg. This often happens on one side of the body.  Trouble walking.  Trouble moving your arms or legs.  Loss of balance or coordination.  Feeling confused.  Trouble talking or understanding what people are saying.  Slurred speech.  Trouble seeing.  Seeing two of one object (double vision).  Feeling dizzy.  Feeling sick to your stomach (nauseous) and throwing up (vomiting).  A very bad headache for no reason. Get help as soon as any of these problems start. This is important. Some treatments work better if they are given right away. These include:  Aspirin.  Medicines to control blood pressure.  A shot (injection) of medicine to break up the blood clot.  Treatments given in the blood vessel (artery) to take out the clot or break it up. Other treatments may include:  Oxygen.  Fluids given through an IV tube.  Medicines to thin out your blood.  Procedures to help your blood flow better. What increases the risk? Certain things may make you more likely to have a stroke. Some of these are things that you can change, such as:  Being very overweight (obesity).  Smoking.  Taking birth control pills.  Not being active.  Drinking too much alcohol.  Using drugs. Other risk factors include:  High blood pressure.  High cholesterol.  Diabetes.  Heart disease.  Being African American, Native American, Hispanic, or [place] Native.  Being over age 60.  Family history of stroke.  Having had blood clots,  stroke, or warning stroke (transient ischemic attack, TIA) in the past.  Sickle cell disease.  Being a woman with a history of high blood pressure in pregnancy (preeclampsia).  Migraine headache.  Sleep apnea.  Having an irregular heartbeat (atrial fibrillation).  Long-term (chronic) diseases that cause soreness and swelling (inflammation).  Disorders that affect how your blood clots. Follow these instructions at home: Medicines  Take over-the-counter and prescription medicines only as told by your doctor.  If you were told to take aspirin or another medicine to thin your blood, take it exactly as told by your doctor. ? Taking too much of the medicine can cause bleeding. ? If you do not take enough, it may not work as well.  Know the side effects of your medicines. If you are taking a blood thinner, make sure you: ? Hold pressure over any cuts for longer than usual. ? Tell your dentist and other doctors that you take this medicine. ? Avoid activities that may cause damage or injury to your body. Eating and drinking  Follow instructions from your doctor about what you cannot eat or drink.  Eat healthy foods.  If you have trouble with swallowing, do these things to avoid choking: ? Take small bites when eating. ? Eat foods that are soft or pureed. Safety  Follow instructions from your health care team about physical activity.  Use a walker or cane as told by your doctor.  Keep your home safe so you do not fall.   This may include: ? Having experts look at your home to make sure it is safe. ? Putting grab bars in the bedroom and bathroom. ? Using raised toilets. ? Putting a seat in the shower. General instructions  Do not use any tobacco products. ? Examples of these are cigarettes, chewing tobacco, and e-cigarettes. ? If you need help quitting, ask your doctor.  Limit how much alcohol you drink. This means no more than 1 drink a day for nonpregnant women and 2 drinks  a day for men. One drink equals 12 oz of beer, 5 oz of wine, or 1 oz of hard liquor.  If you need help to stop using drugs or alcohol, ask your doctor to refer you to a program or specialist.  Stay active. Exercise as told by your doctor.  Keep all follow-up visits as told by your doctor. This is important. Get help right away if:   You have any signs of a stroke. "BE FAST" is an easy way to remember the main warning signs: ? B - Balance. Signs are dizziness, sudden trouble walking, or loss of balance. ? E - Eyes. Signs are trouble seeing or a change in how you see. ? F - Face. Signs are sudden weakness or loss of feeling of the face, or the face or eyelid drooping on one side. ? A - Arms. Signs are weakness or loss of feeling in an arm. This happens suddenly and usually on one side of the body. ? S - Speech. Signs are sudden trouble speaking, slurred speech, or trouble understanding what people say. ? T - Time. Time to call emergency services. Write down what time symptoms started.  You have other signs of a stroke, such as: ? A sudden, very bad headache with no known cause. ? Feeling sick to your stomach (nausea). ? Throwing up (vomiting). ? Jerky movements you cannot control (seizure). These symptoms may be an emergency. Do not wait to see if the symptoms will go away. Get medical help right away. Call your local emergency services (911 in the [place] Do not drive yourself to the hospital. Summary  An ischemic stroke is the sudden death of brain tissue.  Symptoms of a stroke usually happen all of a sudden. You may notice them when you wake up.  Get help if you have any warning signs of a stroke. This is important. Some treatments work better if they are given right away. This information is not intended to replace advice given to you by your health care provider. Make sure you discuss any questions you have with your health care provider. Document Revised: 07/02/2017 Document  Reviewed: 04/19/2015 Elsevier Patient Education  2020 [employer]  

## 2019-07-03 NOTE — Evaluation (Signed)
Occupational Therapy Evaluation Patient Details Name: Kimberly [name] MRN: [id number] DOB: 28-Mar-1965 Today's Date: 07/03/2019    History of Present Illness 54 y.o. female with medical history significant of fibromyalgia and  IBS presented with complaints of left sided tingling and weakness, speech slurred, vertigo, nausea and vomiting. 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 MRI brain negative for stroke. Per neuro consult, "would favor treating this as a symptomatic carotid dissection."   Clinical Impression   Pt admitted with above. She demonstrates the below listed deficits and will benefit from continued OT to maximize safety and independence with BADLs.  Pt presents to OT with Lt UE weakness, impaired balance, and nystagmus with head turns.  She requires min guard assist for ADLs and functional mobility.  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  She will benefit from  An HEP for her Lt UE, and would recommend use of shower seat initially at discharge.        Follow Up Recommendations  No OT follow up;Supervision - Intermittent    Equipment Recommendations  Tub/shower seat(Pt will acquire on her own )    Recommendations for Other Services       Precautions / Restrictions Precautions Precautions: Fall      Mobility Bed Mobility Overal bed mobility: Modified Independent                Transfers Overall transfer level: Needs assistance                    Balance Overall balance assessment: Needs assistance Sitting-balance support: Single extremity supported Sitting balance-Leahy Scale: Good     Standing balance support: No upper extremity supported Standing balance-Leahy Scale: Fair Standing balance comment: able to maintain static standing.                            ADL either performed or assessed with  clinical judgement   ADL Overall ADL's : Needs assistance/impaired Eating/Feeding: Modified independent   Grooming: Wash/dry hands;Wash/dry face;Oral care;Brushing hair;Min guard;Standing   Upper Body Bathing: Min guard;Standing   Lower Body Bathing: Min guard;Sit to/from stand   Upper Body Dressing : Set up;Sitting   Lower Body Dressing: Min guard;Sit to/from stand   Toilet Transfer: Min guard;Ambulation;Comfort height toilet;Grab bars   Toileting- Clothing Manipulation and Hygiene: Min guard;Sit to/from stand       Functional mobility during ADLs: Min guard       Vision Baseline Vision/History: Wears glasses Wears Glasses: At all times Patient Visual Report: No change from baseline Vision Assessment?: Yes Eye Alignment: Within Functional Limits Ocular Range of Motion: Within Functional Limits Alignment/Gaze Preference: Within Defined Limits Tracking/Visual Pursuits: Able to track stimulus in all quads without difficulty Saccades: Decreased speed of saccadic movement Convergence: Within functional limits Visual Fields: No apparent deficits Additional Comments: Nystagmus elicited with head turns      Perception Perception Perception Tested?: Yes   Praxis Praxis Praxis tested?: Within functional limits    Pertinent Vitals/Pain Pain Assessment: No/denies pain     Hand Dominance Right   Extremity/Trunk Assessment Upper Extremity Assessment Upper Extremity Assessment: LUE deficits/detail LUE Deficits / Details: Lt UE movement in Brunnstrom stage 4 initially, but after exercises/neuromuscular reeducation, pt demonstrated movement in Brunnstrom end stage 5  LUE Coordination: decreased gross motor;decreased fine motor  Cervical / Trunk Assessment Cervical / Trunk Assessment: Normal   Communication Communication Communication: No difficulties   Cognition Arousal/Alertness: Awake/alert Behavior During Therapy: WFL for tasks assessed/performed Overall  Cognitive Status: Within Functional Limits for tasks assessed                                 General Comments: Pt scored 0/28 on the Short Blessed Test (no errors)   General Comments       Exercises Exercises: Other exercises Other Exercises Other Exercises: Pt performed 10 reps shoulder flexion with prayer hands and achieving full elbow extension to improve isolated movement of Lt UE.  then worked on overhead reach with Lt UE with focus on good alignment of shoulder.    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         OT Problem List: Decreased strength;Decreased range of motion;Impaired balance (sitting and/or standing);Impaired vision/perception;Decreased knowledge of use of DME or AE;Impaired UE functional use      OT Treatment/Interventions: Self-care/ADL training;Neuromuscular education;Therapeutic activities;Patient/family education;Balance training;Visual/perceptual remediation/compensation    OT Goals(Current goals can be found in the care plan section) Acute Rehab OT Goals Patient Stated Goal: to get back to normal  OT Goal Formulation: With patient Time For Goal Achievement: 07/10/19 Potential to Achieve Goals: Good ADL Goals Additional ADL Goal #1: Pt will be independent with HEP for Lt UE  OT Frequency: Min 2X/week   Barriers to D/C:             Co-evaluation              AM-PAC OT "6 Clicks" Daily Activity     Outcome Measure Help from another person eating meals?: None Help from another person taking care of personal grooming?: A Little Help from another person toileting, which includes using toliet, bedpan, or urinal?: A Little Help from another person bathing (including washing, rinsing, drying)?: A Little Help from another person to put on and taking off regular upper body clothing?: A Little Help from another person to put on and taking off regular lower body clothing?: A Little 6 Click Score: 19   End of Session Equipment Utilized During Treatment: Gait belt Nurse Communication: Mobility status  Activity Tolerance: Patient tolerated treatment well Patient left: Other (comment)(with PT )  OT Visit Diagnosis: Muscle weakness (generalized) (M62.81)                Time: 1093-2355 OT Time Calculation (min): 20 min Charges:  OT General Charges $OT Visit: 1 Visit OT Evaluation $OT Eval Moderate Complexity: 1 Mod  Eber Jones., OTR/L Acute Rehabilitation Services Pager [phone] Office [phone]   Jeani Hawking M 07/03/2019, 1:02 PM

## 2019-07-03 NOTE — Care Management (Signed)
Patient has [facility] insurance on file with active prescription coverage. Spoke w patient over the phone and she confirms this. Her and spouse have information for PCP through [facility] that  They are going to schedule follow up for. 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 We discussed Good Rx prices and she has the app on her phone and is ok with paying Good Rx prices if need be.

## 2019-07-03 NOTE — Progress Notes (Signed)
  Echocardiogram 2D Echocardiogram has been performed.  Gerda Diss 07/03/2019, 1:49 PM

## 2019-07-03 NOTE — Progress Notes (Signed)
STROKE TEAM PROGRESS NOTE   HISTORY OF PRESENT ILLNESS (per record) Kimberly [name] is a 54 y.o. female was in her normal state of health when yesterday she had sudden onset vertigo immediately followed by nausea and vomiting.  She states that she was standing at a counter and suddenly the counter felt like it was collapsing and she had to hold onto something to keep from falling.  She did not fall, and with symptoms improved over a few minutes.  Since that time, she has had persistent left-sided numbness and mild weakness.  She finished out her shift at [facility] after this event yesterday but then sought care today when she still had some symptoms.  She states that her symptoms are improving.  She states that she has not had any recent injury to her neck, but she did wake up feeling like she had "slept on her neck wrong" 2 days ago. In the emergency department, she was evaluated and a CTA showed a vertebral dissection. LKW: 4/27 11:30 AM tpa given?: no, outside of window   INTERVAL HISTORY Her family is not at bedside. We discussed dissections, she works at [employer] and extends her neck a lot and lifts heavy objects, at the time of the incident she was at work. We discussed precautions, no heavy lifting or extension of neck she will probably need work accommodations. I will discharge her on 3 months of dual antiplatelets but advised her we need to see her in the clinic for repeat imaging as we may extend DUAP or possibly just asa  based on repeat imaging.    OBJECTIVE Vitals:   07/03/19 0500 07/03/19 0600 07/03/19 0640 07/03/19 0733  BP:   (!) 131/98 136/90  Pulse:   71 72  Resp: 15 11 14 18   Temp:   97.6 F (36.4 C) 98.1 F (36.7 C)  TempSrc:   Oral Oral  SpO2:    99%  Weight:      Height:        CBC:  Recent Labs  Lab 07/02/19 1110 07/02/19 1140  WBC 5.9  --   NEUTROABS 3.0  --   HGB 15.4* 16.0*  HCT 46.2* 47.0*  MCV 91.1  --   PLT 391  --     Basic Metabolic Panel:  Recent Labs   Lab 07/02/19 1110 07/02/19 1140  NA 139 142  K 3.7 3.6  CL 107 107  CO2 21*  --   GLUCOSE 90 90  BUN 5* 5*  CREATININE 0.54 0.50  CALCIUM 9.4  --     Lipid Panel:     Component Value Date/Time   CHOL 253 (H) 07/03/2019 0340   TRIG 53 07/03/2019 0340   HDL 51 07/03/2019 0340   CHOLHDL 5.0 07/03/2019 0340   VLDL 11 07/03/2019 0340   LDLCALC 191 (H) 07/03/2019 0340   HgbA1c:  Lab Results  Component Value Date   HGBA1C 5.3 07/03/2019   Urine Drug Screen: No results found for: LABOPIA, COCAINSCRNUR, LABBENZ, AMPHETMU, THCU, LABBARB  Alcohol Level No results found for: ETH  IMAGING  CT Angio Head W or Wo Contrast CT Angio Neck W and/or Wo Contrast 07/02/2019 IMPRESSION:  Suspected short 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 MRI recommended to exclude acute infarct.   CT HEAD WO CONTRAST 07/02/2019 IMPRESSION:  Brain parenchyma appears unremarkable.  No mass or hemorrhage. Mucosal thickening noted in several ethmoid air cells. Probable cerumen in the right external auditory  canal.    MR BRAIN WO CONTRAST 07/02/2019 IMPRESSION:  Negative for acute infarct. Mild chronic white matter changes most likely due to small vessel ischemia.   DG Chest Portable 1 View 07/02/2019 IMPRESSION:  1. Interval minimal left basilar atelectasis.  2. Stable mild changes of COPD.  Transthoracic Echocardiogram  00/00/2021 Pending   ECG - SR rate 80 BPM. (See cardiology reading for complete details)   PHYSICAL EXAM Blood pressure 136/90, pulse 72, temperature 98.1 F (36.7 C), temperature source Oral, resp. rate 18, height 5\' 6"  (1.676 m), weight 82.5 kg, SpO2 99 %.  Exam: NAD, pleasant                  Speech:    Speech is normal; fluent and spontaneous with normal comprehension.  Cognition:    The patient is oriented to person, place, and time;     recent and remote memory intact;     language fluent;    Cranial Nerves:     The pupils are equal, round, and reactive to light.Trigeminal sensation is intact and the muscles of mastication are normal. The face is symmetric. The palate elevates in the midline. Hearing intact. Voice is normal. Shoulder shrug is normal. The tongue has normal motion without fasciculations.   Coordination:  No dysmetria  Motor Observation:    No asymmetry, no atrophy, and no involuntary movements noted. Tone:    Normal muscle tone.     Strength:    Strength is V/V in the upper and lower limbs.      Sensation: intact to LT   ASSESSMENT/PLAN [name] is a 54 y.o. female with history of fibromyalgia and IBS presenting with  Vertigo, nausea, vomiting and left sided numbness / weakness. She did not receive IV t-PA due to late presentation (>4.5 hours from time of onset).  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  No mass or hemorrhage. Mucosal thickening noted in several ethmoid air cells. Probable cerumen in the right external auditory canal.   MRI head - Negative for acute infarct. Mild chronic white matter changes most likely due to small vessel ischemia.   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 MRI recommended to exclude acute infarct.   CT Perfusion - not ordered  Carotid Doppler - CTA neck performed - carotid dopplers not indicated.  2D Echo - pending  Kimberly [name] Virus 2  - negative  LDL - 191  HgbA1c - 5.3  UDS - not ordered  VTE prophylaxis - Lovenox Diet  Diet Order            Diet Heart Room service appropriate? Yes; Fluid consistency: Thin  Diet effective now              No antithrombotic prior to admission, now on aspirin 81 mg daily  and clopidogrel 75 mg daily   Discharge on 3 months of dual antiplatelets but advised her we need to see her in the clinic for repeat imaging as we may extend DUAP or possibly just asa  based on repeat imaging.Follow up 4-6 weeks with Dr. Leonie Man or Janett Billow at [facility].  Patient counseled to be compliant with her antithrombotic medications  Ongoing  aggressive stroke risk factor management  Therapy recommendations:  pending  Disposition:  Pending  Hypertension  Home BP meds: none   Current BP meds: none   Stable . Permissive hypertension (OK if < 220/120) but gradually normalize in 5-7 days  . Long-term BP goal normotensive  Hyperlipidemia  Home Lipid lowering medication: none  LDL 191, goal < 70  Current lipid lowering medication: Lipitor 80 mg daily   Continue statin at discharge  Other Stroke Risk Factors  Previous ETOH use, advised to drink no more than 1 alcoholic beverage per day.  Obesity, Body mass index is 29.36 kg/m., recommend weight loss, diet and exercise as appropriate   Other Active Problems  Code status - Full code  Mild polcythemia   Hospital day # 0  Vertebral dissection and stroke (despite neg MRI feel there is a stroke) - discharge on 3 months of dual antiplatelets but advised her we need to see her in the clinic for repeat imaging as we may extend DUAP or possibly just asa based on repeat imaging.  If echo unremarkable. Stroke will sign off today.  Personally examined patient and images, and have participated in and made any corrections needed to history, physical, neuro exam,assessment and plan as stated above.  I have personally obtained the history, evaluated lab date, reviewed imaging studies and agree with radiology interpretations.    Naomie Dean, MD Stroke Neurology   A total of 35 minutes was spent for the care of this patient, spent on counseling patient and family on different diagnostic and therapeutic options, counseling and  coordination of care, riskd ans benefits of management, compliance, or risk factor reduction and education.   To contact Stroke Continuity provider, please refer to [email or web address]. After hours, contact General Neurology

## 2019-07-09 ENCOUNTER — Telehealth: Payer: Self-pay

## 2019-07-09 ENCOUNTER — Ambulatory Visit: Payer: BC Managed Care – PPO | Attending: Internal Medicine

## 2019-07-09 ENCOUNTER — Other Ambulatory Visit: Payer: Self-pay

## 2019-07-09 VITALS — BP 116/94

## 2019-07-09 DIAGNOSIS — M6281 Muscle weakness (generalized): Secondary | ICD-10-CM | POA: Diagnosis not present

## 2019-07-09 DIAGNOSIS — R2689 Other abnormalities of gait and mobility: Secondary | ICD-10-CM

## 2019-07-09 DIAGNOSIS — R2681 Unsteadiness on feet: Secondary | ICD-10-CM

## 2019-07-09 NOTE — Telephone Encounter (Signed)
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  The patient would benefit from OT evaluation for the Left UE weakness and functional deficits.   If you agree, please place an order in [facility] workque in [facility] or fax the order to [phone]. Thank you, Kimberly [name], PT, DPT  [facility] [address] [place], [place]  [address] Phone:  [phone] Fax:  [phone]

## 2019-07-09 NOTE — Therapy (Signed)
[facility] [address] [place], [place], [address] Phone: [phone]   Fax:  [phone]  Physical Therapy Evaluation  Patient Details  Name: Kimberly [name] MRN: [id number] Date of Birth: 04/25/1965 Referring Provider (PT): Cristal Ford, DO   Encounter Date: 07/09/2019  PT End of Session - 07/09/19 1037    Visit Number  1    Number of Visits  13    Date for PT Re-Evaluation  10/07/19   POC for 6 weeks, Cert for 90 days   Authorization Type  BCBS    PT Start Time  0932    PT Stop Time  1021    PT Time Calculation (min)  49 min    Equipment Utilized During Treatment  Gait belt    Activity Tolerance  Patient tolerated treatment well    Behavior During Therapy  [facility] for tasks assessed/performed       Past Medical History:  Diagnosis Date  . Fibromyalgia   . IBS (irritable bowel syndrome)     History reviewed. No pertinent surgical history.  Vitals:   07/09/19 1305  BP: (!) 116/94     Subjective Assessment - 07/09/19 0934    Subjective  Patient reports that she was at work when she blacked out. Felt that earlier during the day she didn't feel quite right. After blacking out she went home and was restless, next morning she was taking to urgent care then taken to hospital due to vertebral artery dissection. Reports left side is weak, and still has some mild nausea. Reports she is having some light sensitivity but reports that it may be due to vertigo as she also has a history of this. Also reports she is having some tingling down in the arm and leg currently. Currently afraid to turn neck and has been turning whole body as much as possible.    Patient is accompained by:  Family member   Husband   Pertinent History  Fibromyalgia and IBS    Limitations  Walking;Standing;Lifting    How long can you walk comfortably?  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 MRI brain negative for stroke    Patient Stated Goals  Be able to go back to "normal", go back to work    Currently in Pain?  No/denies         [facility] PT Assessment - 07/09/19 0001      Assessment   Medical Diagnosis  Vertebral Artery Dissection with Acute CVA    Referring Provider (PT)  Cristal Ford, DO    Onset Date/Surgical Date  07/02/19    Hand Dominance  Right    Next MD Visit  [facility] on 07/27/2019    Prior Therapy  Therapy for LUE due wrist fracture      Precautions   Precautions  Fall    Precaution Comments  Patient reports being told to complete body turns rather than turning neck.       Balance Screen   Has the patient fallen in the past 6 months  No    Has the patient had a decrease in activity level because of a fear of falling?   Yes   reports this due to current condition   Is the patient reluctant to leave their home because of a fear of falling?   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       Coordination   Gross Motor Movements are Fluid and  Coordinated  No   difficulty with toe taps   Heel Shin Test  difficulty due to weakness      ROM / Strength   AROM / PROM / Strength  AROM;Strength      AROM   Overall AROM   Within functional limits for tasks performed    Overall AROM Comments  BLE's      Strength   Overall Strength  Deficits    Strength Assessment Site  Hip;Knee;Ankle    Right/Left Hip  Right;Left    Right Hip Flexion  4+/5    Right Hip ABduction  4+/5    Right Hip ADduction  4+/5    Left Hip Flexion  3-/5    Left Hip ABduction  3-/5    Left Hip ADduction  3-/5    Right/Left Knee  Right;Left    Right Knee Flexion  5/5    Right Knee Extension  5/5    Left Knee Flexion  3+/5    Left Knee Extension  3+/5    Right/Left Ankle  Right;Left    Right Ankle Dorsiflexion  4+/5    Left Ankle Dorsiflexion  3/5      Bed Mobility   Bed Mobility  Rolling Right;Rolling Left;Supine to Sit;Sit to Supine    Rolling Right  Independent   more than reasonable time required   Rolling Left  Independent   more than reasonable time required   Supine to Sit  Independent   more than reasonable time required   Sit to Supine  Independent   more than reasonable time required     Transfers   Transfers  Sit to Stand;Stand to Sit    Sit to Stand  4: Min guard    Stand to Sit  4: Min guard    Comments  Patient require increased time for completion with sit <> stand, patient able to complete 2 sit <> stands in 30 secs w/ UE support.       Ambulation/Gait   Ambulation/Gait  Yes    Ambulation/Gait Assistance  4: Min guard    Ambulation Distance (Feet)  115 Feet    Assistive device  None    Gait Pattern  Step-through pattern;Decreased arm swing - left;Decreased arm swing - right;Decreased step length - right;Decreased  step length - left;Decreased stance time - left;Decreased hip/knee flexion - left;Decreased dorsiflexion - left;Decreased weight shift to left;Poor foot clearance - left    Ambulation Surface  Level;Indoor    Stairs   Yes    Stairs Assistance  4: Min guard    Stair Management Technique  One rail Right;Step to pattern;Forwards    Number of Stairs  4    Height of Stairs  6    Gait Comments  With gait patient demo very low cadence and increased time required. Pt reporting feelings of L knee buckling. Min guard for safety.                  Objective measurements completed on examination: See above findings.              PT Education - 07/09/19 1028    Education Details  Patient educated on POC and evaluation findings    Person(s) Educated  Patient;Spouse    Methods  Explanation    Comprehension  Verbalized understanding       PT Short Term Goals - 07/09/19 1057      PT SHORT TERM GOAL #1   Title  Patient will be independent with Initial HEP    Time  3    Period  Weeks    Status  New    Target Date  07/30/19      PT SHORT TERM GOAL #2   Title  Patient will demonstrate ability to ambulate >/= 350 ft with LRAD before require rest break to demo improved functional mobility and endurance    Baseline  75'    Time  3    Period  Weeks    Status  New    Target Date  07/30/19      PT SHORT TERM GOAL #3   Title  Patient will demonstrate ability to complete 5x sit <> stands in 30 seconds w/ UE support    Baseline  2 sit <> stands    Time  3    Period  Weeks    Status  New    Target Date  07/30/19      PT SHORT TERM GOAL #4   Title  Berg Balance and LTG to be set as appropriate    Time  3    Period  Weeks    Status  New    Target Date  07/30/19        PT Long Term Goals - 07/09/19 1100      PT LONG TERM GOAL #1   Title  Pt will be independent with Final HEP    Time  6    Period  Weeks    Status  New    Target Date  08/20/19      PT LONG TERM GOAL #2   Title  Patient will be able to ascend/descend 6 stairs Mod I with alternating pattern and single rail for improved ability to enter/exit home    Baseline  4, step to, single rail, Min Guard    Time  6    Period   Weeks    Status  New    Target Date  08/20/19      PT LONG TERM GOAL #3   Title  Patient will demo ability to complete 10 sit <> stands in 30 seconds with UE support to demo improved LE strength/endurance    Baseline  2 sit <> stands    Time  6  Period  Weeks    Status  New    Target Date  08/20/19      PT LONG TERM GOAL #4   Title  Patient will demonstrate ability to ambulate >/= 500 ft on indoor/outdoor surfaces with LRAD before require rest break to demo improved functional mobility and endurance    Baseline  75    Time  6    Period  Weeks    Status  New    Target Date  08/20/19      PT LONG TERM GOAL #5   Title  Patient will improved Berg Balance by 7 points from baseline to demonstrate improvements in overall balance    Time  6    Period  Weeks    Status  New    Target Date  08/20/19             Plan - 07/09/19 1039    Clinical Impression Statement  Patient is a 54 y.o. 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 Patient PMH is significant for fibromyalgia and IBS. Patient presents to OPPT with complaints of left side tingling and weakness. Upon evaluation, patient demonstrates decreased functional mobility and LE strength with completion of 2 sit <> stands w/ UE support in 30 seconds. With gait and stair training patient demos abnormal gait pattern and slow gait speed, requiring Min A from PT. Patient would also benefit from OT referral due to current complaints and deficits with LUE, with PT placing order. Further balance assessment to be completed at next session. Pt will benefit from skilled PT services due to decreased strength, abnormal gait, decreased functional mobility, and increased risk for falls.    Personal Factors and Comorbidities  Comorbidity 2    Comorbidities  Fibromyalgia and IBS    Examination-Activity Limitations  Bend;Squat;Stairs;Stand;Lift    Examination-Participation Restrictions  Cleaning;Community  Activity;Driving   Work   Stability/Clinical Decision Making  Evolving/Moderate complexity    Clinical Decision Making  Moderate    Rehab Potential  Good    PT Frequency  2x / week    PT Duration  6 weeks    PT Treatment/Interventions  ADLs/Self Care Home Management;Electrical Stimulation;Moist Heat;Cryotherapy;DME Instruction;Gait training;Stair training;Functional mobility training;Therapeutic activities;Therapeutic exercise;Balance training;Neuromuscular re-education;Patient/family education;Orthotic Fit/Training;Passive range of motion    PT Next Visit Plan  Complete BERG, Gait Training with Gilmer Mor?, Assess Gait Speed with Cane if able. Initiate HEP    Recommended Other Services  OT    Consulted and Agree with Plan of Care  Patient;Family member/caregiver    Family Member Consulted  Husband       Patient will benefit from skilled therapeutic intervention in order to improve the following deficits and impairments:  Abnormal gait, Decreased coordination, Difficulty walking, Decreased endurance, Decreased activity tolerance, Pain, Decreased balance, Decreased mobility, Decreased strength, Impaired sensation  Visit Diagnosis: Muscle weakness (generalized)  Other abnormalities of gait and mobility  Unsteadiness on feet     Problem List Patient Active Problem List   Diagnosis Date Noted  . 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 Near syncope 07/02/2019  . Passive smoke exposure 07/02/2019    Tempie Donning, PT, DPT 07/09/2019, 1:08 PM  [facility] [address] [place], [place], [address] Phone: [phone]   Fax:  [phone]  Name: Kimberly [name] MRN: [id number] Date of Birth: 1965-04-18

## 2019-07-12 ENCOUNTER — Other Ambulatory Visit: Payer: Self-pay

## 2019-07-12 ENCOUNTER — Ambulatory Visit: Payer: BC Managed Care – PPO

## 2019-07-12 DIAGNOSIS — R2681 Unsteadiness on feet: Secondary | ICD-10-CM

## 2019-07-12 DIAGNOSIS — R2689 Other abnormalities of gait and mobility: Secondary | ICD-10-CM

## 2019-07-12 DIAGNOSIS — M6281 Muscle weakness (generalized): Secondary | ICD-10-CM | POA: Diagnosis not present

## 2019-07-12 NOTE — Therapy (Addendum)
[facility] [address] [place], [place], [address] Phone: [phone]   Fax:  [phone]  Physical Therapy Treatment  Patient Details  Name: Kimberly [name] MRN: [id number] Date of Birth: 04/22/1965 Referring Provider (PT): Edsel Petrin, DO   Encounter Date: 07/12/2019  PT End of Session - 07/12/19 1452    Visit Number  2    Number of Visits  13    Date for PT Re-Evaluation  10/07/19   POC for 6 weeks, Cert for 90 days   Authorization Type  BCBS    PT Start Time  1230    PT Stop Time  1315    PT Time Calculation (min)  45 min    Equipment Utilized During Treatment  Gait belt    Activity Tolerance  Patient tolerated treatment well    Behavior During Therapy  [facility] for tasks assessed/performed       Past Medical History:  Diagnosis Date  . Fibromyalgia   . IBS (irritable bowel syndrome)     No past surgical history on file.  There were no vitals filed for this visit.  Subjective Assessment - 07/12/19 1232    Subjective  Patient reports that she has been walking around the house, but has been taking it slow. Stayed by herself yesterday, but slept alot. Patient reports some soreness in the left wrist.    Patient is accompained by:  Family member   Husband   Pertinent History  Fibromyalgia and IBS    Limitations  Walking;Standing;Lifting    How long can you walk comfortably?  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 MRI brain negative for stroke    Patient Stated Goals  Be able to go back to "normal", go back to work    Currently in Pain?  No/denies                        [facility] Adult PT Treatment/Exercise - 07/12/19 1237      Transfers   Transfers  Sit to Stand;Stand to Sit    Sit to Stand  5: Supervision    Stand to Sit  5: Supervision      Ambulation/Gait   Ambulation/Gait   Yes    Ambulation/Gait Assistance  4: Min guard;5: Supervision    Ambulation/Gait Assistance Details  Completed gait training x 100 ft with SPC, patient requiring verbal cues for proper sequencing. trialed various assistive devices including SPC and SPC with quad tip attachment. Patient verbalizing and dmeonstrating improved stability with quad tip attachment at this time. 247ft with SPC with quad tip attachment completed. Verbal cues for improved step length.     Ambulation Distance (Feet)  350 Feet    Assistive device  Straight cane   with quad tip attachment   Gait Pattern  Step-through pattern;Decreased arm swing - left;Decreased arm swing - right;Decreased step length - right;Decreased step length - left;Decreased stance time - left;Decreased hip/knee flexion - left;Decreased dorsiflexion - left;Decreased weight shift to left;Poor foot clearance - left    Ambulation Surface  Level;Indoor    Gait velocity  30.38 secs = 1.06 ft/sec      Standardized Balance Assessment   Standardized Balance Assessment  Berg Balance Test   pt requiring freq rest breaks with [employer] Balance Test   Sit  to Stand  Able to stand  independently using hands    Standing Unsupported  Able to stand safely 2 minutes    Sitting with Back Unsupported but Feet Supported on Floor or Stool  Able to sit safely and securely 2 minutes    Stand to Sit  Sits safely with minimal use of hands    Transfers  Able to transfer safely, definite need of hands    Standing Unsupported with Eyes Closed  Able to stand 10 seconds with supervision    Standing Ubsupported with Feet Together  Able to place feet together independently and stand for 1 minute with supervision    From Standing, Reach Forward with Outstretched Arm  Can reach forward >12 cm safely (5")    From Standing Position, Pick up Object from [facility] to pick up shoe, needs supervision    From Standing Position, Turn to Look Behind Over each Shoulder  Looks  behind one side only/other side shows less weight shift    Turn 360 Degrees  Able to turn 360 degrees safely but slowly    Standing Unsupported, Alternately Place Feet on Step/Stool  Able to complete 4 steps without aid or supervision    Standing Unsupported, One Foot in Front  Able to plae foot ahead of the other independently and hold 30 seconds    Standing on One Leg  Tries to lift leg/unable to hold 3 seconds but remains standing independently    Total Score  41             PT Education - 07/12/19 1529    Education Details  Educated on [employer] Tip Attachment Options for [employer]) Educated  Patient;Spouse    Methods  Explanation    Comprehension  Verbalized understanding       PT Short Term Goals - 07/09/19 1057      PT SHORT TERM GOAL #1   Title  Patient will be independent with Initial HEP    Time  3    Period  Weeks    Status  New    Target Date  07/30/19      PT SHORT TERM GOAL #2   Title  Patient will demonstrate ability to ambulate >/= 350 ft with LRAD before require rest break to demo improved functional mobility and endurance    Baseline  75'    Time  3    Period  Weeks    Status  New    Target Date  07/30/19      PT SHORT TERM GOAL #3   Title  Patient will demonstrate ability to complete 5x sit <> stands in 30 seconds w/ UE support    Baseline  2 sit <> stands    Time  3    Period  Weeks    Status  New    Target Date  07/30/19      PT SHORT TERM GOAL #4   Title  Berg Balance and LTG to be set as appropriate    Time  3    Period  Weeks    Status  New    Target Date  07/30/19        PT Long Term Goals - 07/12/19 1456      PT LONG TERM GOAL #1   Title  Pt will be independent with Final HEP    Time  6    Period  Weeks    Status  New      PT LONG TERM GOAL #2   Title  Patient will be able to ascend/descend 6 stairs Mod I with alternating pattern and single rail for improved ability to enter/exit home    Baseline  4, step to,  single rail, Min Guard    Time  6    Period  Weeks    Status  New      PT LONG TERM GOAL #3   Title  Patient will demo ability to complete 10 sit <> stands in 30 seconds with UE support to demo improved LE strength/endurance    Baseline  2 sit <> stands    Time  6    Period  Weeks    Status  New      PT LONG TERM GOAL #4   Title  Patient will demonstrate ability to ambulate >/= 500 ft on indoor/outdoor surfaces with LRAD before require rest break to demo improved functional mobility and endurance    Baseline  75    Time  6    Period  Weeks    Status  New      PT LONG TERM GOAL #5   Title  Patient will improved Berg Balance to >/= 48/56 points to demonstrate improvements in overall balance    Baseline  41/56    Time  6    Period  Weeks    Status  New            Plan - 07/12/19 1452    Clinical Impression Statement  Today's skilled PT session included further balance assessment with Sharlene Motts Balance, patient scoring a 41/56 demonstrating increased risk for falls. Completed gait training with various AD, patient demo improved stability with SPC with quad tip attachment, PT educating on options for purchasing. Patient will continue to benefit from skilled PT to address deficits, and progress toward unmet goals.    Personal Factors and Comorbidities  Comorbidity 2    Comorbidities  Fibromyalgia and IBS    Examination-Activity Limitations  Bend;Squat;Stairs;Stand;Lift    Examination-Participation Restrictions  Cleaning;Community Activity;Driving   Work   Stability/Clinical Decision Making  Evolving/Moderate complexity    Rehab Potential  Good    PT Frequency  2x / week    PT Duration  6 weeks    PT Treatment/Interventions  ADLs/Self Care Home Management;Electrical Stimulation;Moist Heat;Cryotherapy;DME Instruction;Gait training;Stair training;Functional mobility training;Therapeutic activities;Therapeutic exercise;Balance training;Neuromuscular re-education;Patient/family  education;Orthotic Fit/Training;Passive range of motion    PT Next Visit Plan  Did get SPC? Initiate HEP    Consulted and Agree with Plan of Care  Patient;Family member/caregiver    Family Member Consulted  Husband       Patient will benefit from skilled therapeutic intervention in order to improve the following deficits and impairments:  Abnormal gait, Decreased coordination, Difficulty walking, Decreased endurance, Decreased activity tolerance, Pain, Decreased balance, Decreased mobility, Decreased strength, Impaired sensation  Visit Diagnosis: Muscle weakness (generalized)  Other abnormalities of gait and mobility  Unsteadiness on feet     Problem List Patient Active Problem List   Diagnosis Date Noted  . 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 Near syncope 07/02/2019  . Passive smoke exposure 07/02/2019    Tempie Donning, PT, DPT 07/12/2019, 3:30 PM  [facility] [address] [place], [place], [address] Phone: [phone]   Fax:  [phone]  Name: Kimberly [name] MRN: [id number] Date of Birth: [birth year]

## 2019-07-16 ENCOUNTER — Ambulatory Visit: Payer: BC Managed Care – PPO

## 2019-07-16 ENCOUNTER — Other Ambulatory Visit: Payer: Self-pay

## 2019-07-16 DIAGNOSIS — R2689 Other abnormalities of gait and mobility: Secondary | ICD-10-CM

## 2019-07-16 DIAGNOSIS — R2681 Unsteadiness on feet: Secondary | ICD-10-CM

## 2019-07-16 DIAGNOSIS — M6281 Muscle weakness (generalized): Secondary | ICD-10-CM

## 2019-07-16 NOTE — Patient Instructions (Signed)
Access Code: [email or web address] URL: [email or web address] Date: 07/16/2019 Prepared by: Jethro Bastos  Exercises Sit to Stand with Hands on Knees - 1 x daily - 7 x weekly - 3 sets - 5 reps Supine Bridge - 1 x daily - 7 x weekly - 3 sets - 5 reps Clamshell - 1 x daily - 7 x weekly - 2 sets - 10 reps Supine Heel Slide - 1 x daily - 7 x weekly - 2 sets - 10 reps Seated March - 1 x daily - 7 x weekly - 2 sets - 10 reps Seated Toe Raise - 1 x daily - 7 x weekly - 2 sets - 10 reps Seated Gastroc Stretch with Strap - 1 x daily - 7 x weekly - 1 sets - 3 reps - 30 hold

## 2019-07-16 NOTE — Therapy (Signed)
[facility] [address] [place], [place], [address] Phone: [phone]   Fax:  [phone]  Physical Therapy Treatment  Patient Details  Name: Kimberly [name] MRN: [id number] Date of Birth: 30-Mar-1965 Referring Provider (PT): Cristal Ford, DO   Encounter Date: 07/16/2019   PT End of Session - 07/16/19 1109    Visit Number 3    Number of Visits 13    Date for PT Re-Evaluation 10/07/19   POC for 6 weeks, Cert for 90 days   Authorization Type BCBS    PT Start Time 1101    PT Stop Time 1145    PT Time Calculation (min) 44 min    Equipment Utilized During Treatment Gait belt    Activity Tolerance Patient tolerated treatment well    Behavior During Therapy [facility] for tasks assessed/performed           Past Medical History:  Diagnosis Date  . Fibromyalgia   . IBS (irritable bowel syndrome)     No past surgical history on file.  There were no vitals filed for this visit.   Subjective Assessment - 07/16/19 1105    Subjective Patient reports did not sleep well last night, believes she over did it the day before. Reports she cooked dinner last night. Patient reports increased soreness after that. Patient purchased SPC with quad tip attachment.    Patient is accompained by: Family member   Husband   Pertinent History Fibromyalgia and IBS    Limitations Walking;Standing;Lifting    How long can you walk comfortably? 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 MRI brain negative for stroke    Patient Stated Goals Be able to go back to "normal", go back to work    Currently in Pain? No/denies                             [facility] Adult PT Treatment/Exercise - 07/16/19 0001      Transfers   Transfers Sit to Stand;Stand to Sit    Sit to Stand 6: Modified independent (Device/Increase time)    Stand to  Sit 6: Modified independent (Device/Increase time)    Comments increased time required, pt demo fatigue/leg weakness after completion of 5 reps. 2 x 5 reps.       Ambulation/Gait   Ambulation/Gait Yes    Ambulation/Gait Assistance 5: Supervision;4: Min guard    Ambulation/Gait Assistance Details Completed gait training with patients personal SPC with quad tip attchment. Patient demo improved gait speed and stability with AD. Able to ambulated 230 ft without rest break today.     Ambulation Distance (Feet) 230 Feet    Assistive device Straight cane   w/ quad tip attachment   Gait Pattern Step-through pattern;Decreased arm swing - left;Decreased arm swing - right;Decreased step length - right;Decreased step length - left;Decreased stance time - left;Decreased hip/knee flexion - left;Decreased dorsiflexion - left;Decreased weight shift to left;Poor foot clearance - left    Ambulation Surface Level;Indoor      Exercises   Exercises Other Exercises    Other Exercises  Initiated HEP focused on BLE strengthening and stretching. See below for detaislr egarding all exercises completed in today's session (or in patient instructions/medbridge)           Exercises completed and advised to complete with HEP:  Access Code: [email or web address] URL: [email or web address] Date: 07/16/2019 Prepared by: Jethro Bastos  Exercises Sit to Stand with Hands on Knees - 1 x daily - 7 x weekly - 3 sets - 5 reps Supine Bridge - 1 x daily - 7 x weekly - 3 sets - 5 reps Clamshell - 1 x daily - 7 x weekly - 2 sets - 10 reps Supine Heel Slide - 1 x daily - 7 x weekly - 2 sets - 10 reps Seated March - 1 x daily - 7 x weekly - 2 sets - 10 reps Seated Toe Raise - 1 x daily - 7 x weekly - 2 sets - 10 reps Seated Gastroc Stretch with Strap - 1 x daily - 7 x weekly - 1 sets - 3 reps - 30 hold         PT Education - 07/16/19 1244    Education Details Educated on Initial HEP    Person(s) Educated Patient;Spouse     Methods Explanation;Demonstration;Handout    Comprehension Returned demonstration;Verbalized understanding            PT Short Term Goals - 07/09/19 1057      PT SHORT TERM GOAL #1   Title Patient will be independent with Initial HEP    Time 3    Period Weeks    Status New    Target Date 07/30/19      PT SHORT TERM GOAL #2   Title Patient will demonstrate ability to ambulate >/= 350 ft with LRAD before require rest break to demo improved functional mobility and endurance    Baseline 75'    Time 3    Period Weeks    Status New    Target Date 07/30/19      PT SHORT TERM GOAL #3   Title Patient will demonstrate ability to complete 5x sit <> stands in 30 seconds w/ UE support    Baseline 2 sit <> stands    Time 3    Period Weeks    Status New    Target Date 07/30/19      PT SHORT TERM GOAL #4   Title Berg Balance and LTG to be set as appropriate    Time 3    Period Weeks    Status New    Target Date 07/30/19             PT Long Term Goals - 07/12/19 1456      PT LONG TERM GOAL #1   Title Pt will be independent with Final HEP    Time 6    Period Weeks    Status New      PT LONG TERM GOAL #2   Title Patient will be able to ascend/descend 6 stairs Mod I with alternating pattern and single rail for improved ability to enter/exit home    Baseline 4, step to, single rail, Min Guard    Time 6    Period Weeks    Status New      PT LONG TERM GOAL #3   Title Patient will demo ability to complete 10 sit <> stands in 30 seconds with UE support to demo improved LE strength/endurance    Baseline 2 sit <> stands    Time 6    Period Weeks    Status New      PT LONG TERM GOAL #4   Title Patient will demonstrate ability to ambulate >/= 500 ft on indoor/outdoor surfaces with LRAD before require  rest break to demo improved functional mobility and endurance    Baseline 75    Time 6    Period Weeks    Status New      PT LONG TERM GOAL #5   Title Patient will  improved Berg Balance to >/= 48/56 points to demonstrate improvements in overall balance    Baseline 41/56    Time 6    Period Weeks    Status New                 Plan - 07/16/19 1244    Clinical Impression Statement Completed further gait training with SPC with quad tip attachment, patient demonstrate improved gait speed and stability with this AD at this time, and able to ambulate increased distances with AD. Spent rest of session initiating HEP focused on BLE strengthening and stretching. Patient tolerating all exercises well. Pt will continue to benefit from skilled PT services and address deficits.    Personal Factors and Comorbidities Comorbidity 2    Comorbidities Fibromyalgia and IBS    Examination-Activity Limitations Bend;Squat;Stairs;Stand;Lift    Examination-Participation Restrictions Cleaning;Community Activity;Driving   Work   Stability/Clinical Decision Making Evolving/Moderate complexity    Rehab Potential Good    PT Frequency 2x / week    PT Duration 6 weeks    PT Treatment/Interventions ADLs/Self Care Home Management;Electrical Stimulation;Moist Heat;Cryotherapy;DME Instruction;Gait training;Stair training;Functional mobility training;Therapeutic activities;Therapeutic exercise;Balance training;Neuromuscular re-education;Patient/family education;Orthotic Fit/Training;Passive range of motion    PT Next Visit Plan How is HEP? Balance Exercises. Strenghtenign Exercises.    Consulted and Agree with Plan of Care Patient;Family member/caregiver    Family Member Consulted Husband           Patient will benefit from skilled therapeutic intervention in order to improve the following deficits and impairments:  Abnormal gait, Decreased coordination, Difficulty walking, Decreased endurance, Decreased activity tolerance, Pain, Decreased balance, Decreased mobility, Decreased strength, Impaired sensation  Visit Diagnosis: Muscle weakness (generalized)  Other abnormalities  of gait and mobility  Unsteadiness on feet     Problem List Patient Active Problem List   Diagnosis Date Noted  . 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 Near syncope 07/02/2019  . Passive smoke exposure 07/02/2019    Tempie Donning, PT, DPT 07/16/2019, 12:48 PM   [facility] [address] [place], [place], [address] Phone: [phone]   Fax:  [phone]  Name: Clydene Burack MRN: [id number] Date of Birth: Jul 19, 1965

## 2019-07-19 ENCOUNTER — Ambulatory Visit: Payer: BC Managed Care – PPO

## 2019-07-19 ENCOUNTER — Other Ambulatory Visit: Payer: Self-pay

## 2019-07-19 DIAGNOSIS — M6281 Muscle weakness (generalized): Secondary | ICD-10-CM

## 2019-07-19 DIAGNOSIS — R2689 Other abnormalities of gait and mobility: Secondary | ICD-10-CM

## 2019-07-19 DIAGNOSIS — R2681 Unsteadiness on feet: Secondary | ICD-10-CM

## 2019-07-19 NOTE — Therapy (Signed)
[facility] [address] [place], [place], [address] Phone: [phone]   Fax:  [phone]  Physical Therapy Treatment  Patient Details  Name: Kimberly [name] MRN: [id number] Date of Birth: 1965/11/29 Referring Provider (PT): Edsel Petrin, DO   Encounter Date: 07/19/2019   PT End of Session - 07/19/19 1240    Visit Number 4    Number of Visits 13    Date for PT Re-Evaluation 10/07/19   POC for 6 weeks, Cert for 90 days   Authorization Type BCBS    PT Start Time 1232    PT Stop Time 1316    PT Time Calculation (min) 44 min    Equipment Utilized During Treatment Gait belt    Activity Tolerance Patient tolerated treatment well    Behavior During Therapy [facility] for tasks assessed/performed           Past Medical History:  Diagnosis Date  . Fibromyalgia   . IBS (irritable bowel syndrome)     No past surgical history on file.  There were no vitals filed for this visit.   Subjective Assessment - 07/19/19 1235    Subjective Patient reports yesterday was birthday, reports relaxed mostly. Reports no new complaints overall, did have sensation of shocking on tongue at times. Reports completed HEP, went well.    Patient is accompained by: Family member   Husband   Pertinent History Fibromyalgia and IBS    Limitations Walking;Standing;Lifting    How long can you walk comfortably? 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 MRI brain negative for stroke    Patient Stated Goals Be able to go back to "normal", go back to work    Currently in Pain? Yes    Pain Score 7     Pain Location Leg    Pain Orientation Left    Pain Descriptors / Indicators Sore                   Vestibular Assessment - 07/19/19 0001      Symptom Behavior   Subjective history of current problem Pt. has prior history of vertigo.  But patient currently reports increased symptoms spontaneous or change in position.       Oculomotor Exam   Oculomotor Alignment Normal    Smooth Pursuits Saccades    Saccades Overshoots    Comment Patient has saccades with smooth pursuit to L.                     [facility] Adult PT Treatment/Exercise - 07/19/19 0001      Transfers   Transfers Sit to Stand;Stand to Sit    Sit to Stand 6: Modified independent (Device/Increase time)    Stand to Sit 6: Modified independent (Device/Increase time)    Comments completed sit <> stand training with 2" step under RLE to further promote strengthening of LLE. Completed 2 x 5 reps.       Ambulation/Gait   Ambulation/Gait Yes    Ambulation/Gait Assistance 5: Supervision    Ambulation Distance (Feet) 230 Feet    Assistive device Straight cane   w/ quad tip attachment   Gait Pattern Step-through pattern;Decreased arm swing - left;Decreased arm swing - right;Decreased step length - right;Decreased step length - left;Decreased stance time - left;Decreased hip/knee flexion - left;Decreased dorsiflexion - left;Decreased weight shift to left;Poor foot  clearance - left    Ambulation Surface Level;Indoor      Exercises   Exercises Other Exercises    Other Exercises  Completed bridges per patient's request to ensure proper completion at home, completed 2 x 10 reps. PT provided proper demonstration of exercise with patient demo good form overall with completion.                Balance Exercises - 07/19/19 0001      Balance Exercises: Standing   Standing Eyes Opened Narrow base of support (BOS);Foam/compliant surface;2 reps;30 secs    Standing Eyes Closed Narrow base of support (BOS);Foam/compliant surface;3 reps;30 secs    Tandem Stance Eyes open;Intermittent upper extremity support;3 reps;30 secs    Balance Beam Standing on balance beam, focus on holding steady 3 x 30 seconds. Increased shakiness noted in BLE's with completion.    Other  Standing Exercises Comments All balance activites completed in standing in // bars.                PT Short Term Goals - 07/09/19 1057      PT SHORT TERM GOAL #1   Title Patient will be independent with Initial HEP    Time 3    Period Weeks    Status New    Target Date 07/30/19      PT SHORT TERM GOAL #2   Title Patient will demonstrate ability to ambulate >/= 350 ft with LRAD before require rest break to demo improved functional mobility and endurance    Baseline 75'    Time 3    Period Weeks    Status New    Target Date 07/30/19      PT SHORT TERM GOAL #3   Title Patient will demonstrate ability to complete 5x sit <> stands in 30 seconds w/ UE support    Baseline 2 sit <> stands    Time 3    Period Weeks    Status New    Target Date 07/30/19      PT SHORT TERM GOAL #4   Title Berg Balance and LTG to be set as appropriate    Time 3    Period Weeks    Status New    Target Date 07/30/19             PT Long Term Goals - 07/12/19 1456      PT LONG TERM GOAL #1   Title Pt will be independent with Final HEP    Time 6    Period Weeks    Status New      PT LONG TERM GOAL #2   Title Patient will be able to ascend/descend 6 stairs Mod I with alternating pattern and single rail for improved ability to enter/exit home    Baseline 4, step to, single rail, Min Guard    Time 6    Period Weeks    Status New      PT LONG TERM GOAL #3   Title Patient will demo ability to complete 10 sit <> stands in 30 seconds with UE support to demo improved LE strength/endurance    Baseline 2 sit <> stands    Time 6    Period Weeks    Status New      PT LONG TERM GOAL #4   Title Patient will demonstrate ability to ambulate >/= 500 ft on indoor/outdoor surfaces with LRAD before require rest break to demo improved functional mobility and endurance  Baseline 75    Time 6    Period Weeks    Status New      PT LONG TERM GOAL #5   Title Patient will improved Berg Balance to  >/= 48/56 points to demonstrate improvements in overall balance    Baseline 41/56    Time 6    Period Weeks    Status New                 Plan - 07/19/19 1636    Clinical Impression Statement Today's skilled PT session included continued focus on gait training and sit <> stand training. Pt demo good form with sit <> stand just decreased endurance at this time. With static balance activites, pt demo increased sway and unsteadiness with conditions included eyes closed and complaint surfaces. Pt will continue to benefit from skilled PT services to address deficits and progress toward goals.    Personal Factors and Comorbidities Comorbidity 2    Comorbidities Fibromyalgia and IBS    Examination-Activity Limitations Bend;Squat;Stairs;Stand;Lift    Examination-Participation Restrictions Cleaning;Community Activity;Driving   Work   Stability/Clinical Decision Making Evolving/Moderate complexity    Rehab Potential Good    PT Frequency 2x / week    PT Duration 6 weeks    PT Treatment/Interventions ADLs/Self Care Home Management;Electrical Stimulation;Moist Heat;Cryotherapy;DME Instruction;Gait training;Stair training;Functional mobility training;Therapeutic activities;Therapeutic exercise;Balance training;Neuromuscular re-education;Patient/family education;Orthotic Fit/Training;Passive range of motion    PT Next Visit Plan How is HEP? Balance Exercises. Strenghtenign Exercises.    Consulted and Agree with Plan of Care Patient;Family member/caregiver    Family Member Consulted Husband           Patient will benefit from skilled therapeutic intervention in order to improve the following deficits and impairments:  Abnormal gait, Decreased coordination, Difficulty walking, Decreased endurance, Decreased activity tolerance, Pain, Decreased balance, Decreased mobility, Decreased strength, Impaired sensation  Visit Diagnosis: Muscle weakness (generalized)  Other abnormalities of gait and  mobility  Unsteadiness on feet     Problem List Patient Active Problem List   Diagnosis Date Noted  . Vertebral artery dissection ([place]) 07/02/2019  . Near syncope 07/02/2019  . Passive smoke exposure 07/02/2019    Jones Bales, PT, DPT 07/19/2019, 4:39 PM  [facility] [address] [place], [place], [address] Phone: [phone]   Fax:  [phone]  Name: Kimberly [name] MRN: [id number] Date of Birth: 1966-02-02

## 2019-07-22 ENCOUNTER — Ambulatory Visit: Payer: BC Managed Care – PPO

## 2019-07-22 ENCOUNTER — Other Ambulatory Visit: Payer: Self-pay

## 2019-07-22 DIAGNOSIS — M6281 Muscle weakness (generalized): Secondary | ICD-10-CM | POA: Diagnosis not present

## 2019-07-22 DIAGNOSIS — R2689 Other abnormalities of gait and mobility: Secondary | ICD-10-CM

## 2019-07-22 DIAGNOSIS — R2681 Unsteadiness on feet: Secondary | ICD-10-CM

## 2019-07-22 NOTE — Therapy (Signed)
[facility] [address] [place], [place], [address] Phone: [phone]   Fax:  [phone]  Physical Therapy Treatment  Patient Details  Name: Kimberly [name] MRN: [id number] Date of Birth: January 13, 1966 Referring Provider (PT): Cristal Ford, DO   Encounter Date: 07/22/2019   PT End of Session - 07/22/19 1153    Visit Number 5    Number of Visits 13    Date for PT Re-Evaluation 10/07/19   POC for 6 weeks, Cert for 90 days   Authorization Type BCBS    PT Start Time 1148    PT Stop Time 1231    PT Time Calculation (min) 43 min    Equipment Utilized During Treatment Gait belt    Activity Tolerance Patient tolerated treatment well    Behavior During Therapy [facility] for tasks assessed/performed           Past Medical History:  Diagnosis Date  . Fibromyalgia   . IBS (irritable bowel syndrome)     No past surgical history on file.  There were no vitals filed for this visit.   Subjective Assessment - 07/22/19 1151    Subjective Patient reports some fatigue today. Reports that HEP is still going well. Reports some tingling in L Hand today.    Patient is accompained by: Family member   Husband   Pertinent History Fibromyalgia and IBS    Limitations Walking;Standing;Lifting    How long can you walk comfortably? 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 MRI brain negative for stroke    Patient Stated Goals Be able to go back to "normal", go back to work    Currently in Pain? No/denies                             [facility] Adult PT Treatment/Exercise - 07/22/19 1154      Ambulation/Gait   Ambulation/Gait Yes    Ambulation/Gait Assistance 5: Supervision;4: Min guard    Ambulation/Gait Assistance Details Completed gait training x 230 ft w/ SPC with patient demonstrating good stability and require  supv. Followed by completing gait training w/o AD. When ambulating w/o AD patient demonstrates slow gait speed, decreased step length, and require CGA intermittently. Overall with gait training, patient require verbal cues for step length and for relaxation of UE to promote arm swing.     Ambulation Distance (Feet) 460 Feet   230 w/ AD, 230 w/o AD   Assistive device Straight cane;None   w/ Quad Tip Attachment   Gait Pattern Step-through pattern;Decreased arm swing - left;Decreased arm swing - right;Decreased step length - right;Decreased step length - left;Decreased stance time - left;Decreased hip/knee flexion - left;Decreased dorsiflexion - left;Decreased weight shift to left;Poor foot clearance - left    Ambulation Surface Level;Indoor    Gait Comments Educated patient to begin to complete short distance ambulation beside countertop at home to work on building confidence walking w/o AD.                Balance Exercises - 07/22/19 0001      Balance Exercises: Standing   Standing Eyes Opened Wide (BOA);Foam/compliant surface;3 reps;30 secs    Standing Eyes Closed Narrow base of support (BOS);Solid surface;3 reps;30 secs    Tandem Stance Eyes open;3 reps;30 secs    Marching Solid surface;Forwards;Limitations  Marching Limitations in // bars, with light UE support. Focus on improved hip/knee flexion and verbal cues for slowed pace.     Other Standing Exercises Comments Educated patient and spouse on completing all balance actvities added to HEP in a corner in the house with a chair placed in front for improved safety.              PT Education - 07/22/19 1413    Education Details Educated on HEP Update (see patient instructions; added balance exercises)    Person(s) Educated Patient;Spouse    Methods Explanation;Demonstration;Handout    Comprehension Verbalized understanding;Returned demonstration            PT Short Term Goals - 07/09/19 1057      PT SHORT TERM GOAL #1    Title Patient will be independent with Initial HEP    Time 3    Period Weeks    Status New    Target Date 07/30/19      PT SHORT TERM GOAL #2   Title Patient will demonstrate ability to ambulate >/= 350 ft with LRAD before require rest break to demo improved functional mobility and endurance    Baseline 75'    Time 3    Period Weeks    Status New    Target Date 07/30/19      PT SHORT TERM GOAL #3   Title Patient will demonstrate ability to complete 5x sit <> stands in 30 seconds w/ UE support    Baseline 2 sit <> stands    Time 3    Period Weeks    Status New    Target Date 07/30/19      PT SHORT TERM GOAL #4   Title Berg Balance and LTG to be set as appropriate    Time 3    Period Weeks    Status New    Target Date 07/30/19             PT Long Term Goals - 07/12/19 1456      PT LONG TERM GOAL #1   Title Pt will be independent with Final HEP    Time 6    Period Weeks    Status New      PT LONG TERM GOAL #2   Title Patient will be able to ascend/descend 6 stairs Mod I with alternating pattern and single rail for improved ability to enter/exit home    Baseline 4, step to, single rail, Min Guard    Time 6    Period Weeks    Status New      PT LONG TERM GOAL #3   Title Patient will demo ability to complete 10 sit <> stands in 30 seconds with UE support to demo improved LE strength/endurance    Baseline 2 sit <> stands    Time 6    Period Weeks    Status New      PT LONG TERM GOAL #4   Title Patient will demonstrate ability to ambulate >/= 500 ft on indoor/outdoor surfaces with LRAD before require rest break to demo improved functional mobility and endurance    Baseline 75    Time 6    Period Weeks    Status New      PT LONG TERM GOAL #5   Title Patient will improved Berg Balance to >/= 48/56 points to demonstrate improvements in overall balance    Baseline 41/56    Time 6    Period  Weeks    Status New                 Plan - 07/22/19 1414     Clinical Impression Statement Completed gait training in today's skilled therapy session, initially completed with SPC with quad tip, and progressed to completing w/o AD. Pt require CGA when ambulating w/o AD, and demonstrates decreased step length and gait speed. Continued balance exercises as tolerated by patient, and updated HEP to include balance activities. Pt will continue to benefit from skilled PT services to progress toward goals.    Personal Factors and Comorbidities Comorbidity 2    Comorbidities Fibromyalgia and IBS    Examination-Activity Limitations Bend;Squat;Stairs;Stand;Lift    Examination-Participation Restrictions Cleaning;Community Activity;Driving   Work   Stability/Clinical Decision Making Evolving/Moderate complexity    Rehab Potential Good    PT Frequency 2x / week    PT Duration 6 weeks    PT Treatment/Interventions ADLs/Self Care Home Management;Electrical Stimulation;Moist Heat;Cryotherapy;DME Instruction;Gait training;Stair training;Functional mobility training;Therapeutic activities;Therapeutic exercise;Balance training;Neuromuscular re-education;Patient/family education;Orthotic Fit/Training;Passive range of motion    PT Next Visit Plan How is HEP? Balance Exercises. Strenghtenign Exercises.    Consulted and Agree with Plan of Care Patient;Family member/caregiver    Family Member Consulted Husband           Patient will benefit from skilled therapeutic intervention in order to improve the following deficits and impairments:  Abnormal gait, Decreased coordination, Difficulty walking, Decreased endurance, Decreased activity tolerance, Pain, Decreased balance, Decreased mobility, Decreased strength, Impaired sensation  Visit Diagnosis: Muscle weakness (generalized)  Other abnormalities of gait and mobility  Unsteadiness on feet     Problem List Patient Active Problem List   Diagnosis Date Noted  . 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 Near  syncope 07/02/2019  . Passive smoke exposure 07/02/2019    Tempie Donning, PT, DPT 07/22/2019, 2:23 PM  [facility] [address] [place], [place], [address] Phone: [phone]   Fax:  [phone]  Name: Kimberly [name] MRN: [id number] Date of Birth: Jul 04, 1965

## 2019-07-22 NOTE — Patient Instructions (Signed)
Access Code: [email or web address] URL: [email or web address] Date: 07/22/2019 Prepared by: Jethro Bastos  Exercises Sit to Stand with Hands on Knees - 1 x daily - 7 x weekly - 3 sets - 5 reps Supine Bridge - 1 x daily - 7 x weekly - 3 sets - 5 reps Clamshell - 1 x daily - 7 x weekly - 2 sets - 10 reps Supine Heel Slide - 1 x daily - 7 x weekly - 2 sets - 10 reps Seated March - 1 x daily - 7 x weekly - 2 sets - 10 reps Seated Toe Raise - 1 x daily - 7 x weekly - 2 sets - 10 reps Seated Gastroc Stretch with Strap - 1 x daily - 7 x weekly - 1 sets - 3 reps - 30 hold Tandem Stance - 1 x daily - 7 x weekly - 1 sets - 3 reps - 30 hold Romberg Stance with Eyes Closed - 1 x daily - 7 x weekly - 1 sets - 3 reps - 30 hold Standing on Foam Pad - 1 x daily - 7 x weekly - 1 sets - 3 reps - 30 hold Walking March with Countertop Support - 1 x daily - 7 x weekly - 3 sets - 10 reps

## 2019-07-27 ENCOUNTER — Other Ambulatory Visit: Payer: Self-pay

## 2019-07-27 ENCOUNTER — Encounter: Payer: Self-pay | Admitting: Adult Health

## 2019-07-27 ENCOUNTER — Ambulatory Visit: Payer: BC Managed Care – PPO

## 2019-07-27 ENCOUNTER — Ambulatory Visit: Payer: BC Managed Care – PPO | Admitting: Adult Health

## 2019-07-27 VITALS — BP 127/89 | HR 89 | Ht 66.0 in | Wt 185.0 lb

## 2019-07-27 DIAGNOSIS — R42 Dizziness and giddiness: Secondary | ICD-10-CM

## 2019-07-27 DIAGNOSIS — I7774 Dissection of vertebral artery: Secondary | ICD-10-CM | POA: Diagnosis not present

## 2019-07-27 DIAGNOSIS — G8114 Spastic hemiplegia affecting left nondominant side: Secondary | ICD-10-CM

## 2019-07-27 DIAGNOSIS — M6281 Muscle weakness (generalized): Secondary | ICD-10-CM

## 2019-07-27 DIAGNOSIS — R2681 Unsteadiness on feet: Secondary | ICD-10-CM

## 2019-07-27 DIAGNOSIS — R2689 Other abnormalities of gait and mobility: Secondary | ICD-10-CM

## 2019-07-27 DIAGNOSIS — E785 Hyperlipidemia, unspecified: Secondary | ICD-10-CM

## 2019-07-27 DIAGNOSIS — I69398 Other sequelae of cerebral infarction: Secondary | ICD-10-CM | POA: Diagnosis not present

## 2019-07-27 MED ORDER — BACLOFEN 5 MG PO TABS: 5.0000 mg | ORAL_TABLET | Freq: Three times a day (TID) | ORAL | 2 refills | Status: DC | PRN

## 2019-07-27 NOTE — Therapy (Signed)
[facility] [address] [place], [place], [address] Phone: [phone]   Fax:  [phone]  Physical Therapy Treatment  Patient Details  Name: Kimberly [name] MRN: [id number] Date of Birth: [birth year] Referring Provider (PT): Cristal Ford, DO   Encounter Date: 07/27/2019   PT End of Session - 07/27/19 2039    Visit Number 6    Number of Visits 13    Date for PT Re-Evaluation 10/07/19   POC for 6 weeks, Cert for 90 days   Authorization Type BCBS    PT Start Time [phone]    PT Stop Time 0500    PT Time Calculation (min) 43 min    Equipment Utilized During Treatment Gait belt    Activity Tolerance Patient tolerated treatment well    Behavior During Therapy [facility] for tasks assessed/performed           Past Medical History:  Diagnosis Date  . Fibromyalgia   . IBS (irritable bowel syndrome)     History reviewed. No pertinent surgical history.  There were no vitals filed for this visit.   Subjective Assessment - 07/27/19 1620    Subjective Patient reports feeling fatigued due a busy morning. No pain. Patient reports that went to [facility] prior today, is not able to go back to work still Sept.    Patient is accompained by: Family member   Husband   Pertinent History Fibromyalgia and IBS    Limitations Walking;Standing;Lifting    How long can you walk comfortably? 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 MRI brain negative for stroke    Patient Stated Goals Be able to go back to "normal", go back to work    Currently in Pain? No/denies                             [facility] Adult PT Treatment/Exercise - 07/27/19 0001      Transfers   Transfers Sit to Stand;Stand to Sit    Sit to Stand 5: Supervision    Stand to Sit 5: Supervision    Comments Completed sit <> stand training with 2" step under RLE  to promote weight shifting to LLE and promote strengthening. Completed 2 x 5 reps.       Ambulation/Gait   Ambulation/Gait Yes    Ambulation/Gait Assistance 5: Supervision;4: Min guard    Ambulation/Gait Assistance Details PT providing manual faciliation at pelvis during gait training to facilaite to improve step length and weight shift. PT providing verbal cues for step length    Ambulation Distance (Feet) 230 Feet    Assistive device None    Gait Pattern Step-through pattern;Decreased arm swing - left;Decreased arm swing - right;Decreased step length - right;Decreased step length - left;Decreased stance time - left;Decreased hip/knee flexion - left;Decreased dorsiflexion - left;Decreased weight shift to left;Poor foot clearance - left    Ambulation Surface Level;Indoor    Stairs Yes    Stairs Assistance 5: Supervision    Stairs Assistance Details (indicate cue type and reason) Pt require increased time for completion.     Stair Management Technique Two rails;Step to pattern;Forwards    Number of Stairs 4    Height of Stairs 6      Self-Care   Self-Care Other Self-Care Comments    Other Self-Care Comments  Educated on  conserving energy with daily activites at home as patient reports doing various activities and having excessive fatigue. Also educated on proper HR target range with activities, with patients HR max for current age at 64, focusing on staying between 65-75% of HR Max.       Neuro Re-ed    Neuro Re-ed Details  Completed standing activity: completed toe tap to soccer ball with RLE 2 x 10 reps to promote weight shift and strenghtening of LLE. Completed without UE support.       Exercises   Exercises Other Exercises    Other Exercises  Completed forward step ups on 6" step, 2 x 5 reps. Completed with BUE support with bilateral rails.                     PT Short Term Goals - 07/09/19 1057      PT SHORT TERM GOAL #1   Title Patient will be independent with Initial  HEP    Time 3    Period Weeks    Status New    Target Date 07/30/19      PT SHORT TERM GOAL #2   Title Patient will demonstrate ability to ambulate >/= 350 ft with LRAD before require rest break to demo improved functional mobility and endurance    Baseline 75'    Time 3    Period Weeks    Status New    Target Date 07/30/19      PT SHORT TERM GOAL #3   Title Patient will demonstrate ability to complete 5x sit <> stands in 30 seconds w/ UE support    Baseline 2 sit <> stands    Time 3    Period Weeks    Status New    Target Date 07/30/19      PT SHORT TERM GOAL #4   Title Berg Balance and LTG to be set as appropriate    Time 3    Period Weeks    Status New    Target Date 07/30/19             PT Long Term Goals - 07/12/19 1456      PT LONG TERM GOAL #1   Title Pt will be independent with Final HEP    Time 6    Period Weeks    Status New      PT LONG TERM GOAL #2   Title Patient will be able to ascend/descend 6 stairs Mod I with alternating pattern and single rail for improved ability to enter/exit home    Baseline 4, step to, single rail, Min Guard    Time 6    Period Weeks    Status New      PT LONG TERM GOAL #3   Title Patient will demo ability to complete 10 sit <> stands in 30 seconds with UE support to demo improved LE strength/endurance    Baseline 2 sit <> stands    Time 6    Period Weeks    Status New      PT LONG TERM GOAL #4   Title Patient will demonstrate ability to ambulate >/= 500 ft on indoor/outdoor surfaces with LRAD before require rest break to demo improved functional mobility and endurance    Baseline 75    Time 6    Period Weeks    Status New      PT LONG TERM GOAL #5   Title Patient will improved [employer]  to >/= 48/56 points to demonstrate improvements in overall balance    Baseline 41/56    Time 6    Period Weeks    Status New                 Plan - 07/27/19 2034    Clinical Impression Statement Today's skilled  PT session focused on continued gait training without AD, PT providing faciliation at pelvis to promote improved step length. Continued NMR activiteis to further promote weight acceptance and weight shift to L, as well as LLE strengthening. OT referral recieved, and scheduled OT evaluation for further assessment of LUE. Pt will continue to benefit from skilled PT services to progress toward all goals.    Personal Factors and Comorbidities Comorbidity 2    Comorbidities Fibromyalgia and IBS    Examination-Activity Limitations Bend;Squat;Stairs;Stand;Lift    Examination-Participation Restrictions Cleaning;Community Activity;Driving   Work   Stability/Clinical Decision Making Evolving/Moderate complexity    Rehab Potential Good    PT Frequency 2x / week    PT Duration 6 weeks    PT Treatment/Interventions ADLs/Self Care Home Management;Electrical Stimulation;Moist Heat;Cryotherapy;DME Instruction;Gait training;Stair training;Functional mobility training;Therapeutic activities;Therapeutic exercise;Balance training;Neuromuscular re-education;Patient/family education;Orthotic Fit/Training;Passive range of motion    PT Next Visit Plan How is HEP? Balance Exercises. Strenghtenign Exercises.    Consulted and Agree with Plan of Care Patient;Family member/caregiver    Family Member Consulted Husband           Patient will benefit from skilled therapeutic intervention in order to improve the following deficits and impairments:  Abnormal gait, Decreased coordination, Difficulty walking, Decreased endurance, Decreased activity tolerance, Pain, Decreased balance, Decreased mobility, Decreased strength, Impaired sensation  Visit Diagnosis: Muscle weakness (generalized)  Other abnormalities of gait and mobility  Unsteadiness on feet     Problem List Patient Active Problem List   Diagnosis Date Noted  . 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 Near syncope 07/02/2019  . Passive smoke exposure  07/02/2019    Tempie Donning, PT, DPT 07/27/2019, 8:40 PM  [facility] [address] [place], [place], [address] Phone: [phone]   Fax:  [phone]  Name: Kimberly [name] MRN: [id number] Date of Birth: 1965/02/19

## 2019-07-27 NOTE — Progress Notes (Signed)
[facility] [address] [place]. [place] [address] [phone]       HOSPITAL FOLLOW UP NOTE  [name] Date of Birth:  07/06/1965 Medical Record Number:  [id number]   Reason for Referral:  hospital stroke follow up    SUBJECTIVE:   CHIEF COMPLAINT:  Chief Complaint  Patient presents with  . Follow-up    tx rm here for a stroke f/u.    HPI:   [name] is a 54 y.o. female with history of fibromyalgia and IBS  who presented on 07/02/2019 with  Vertigo, nausea, vomiting and left sided numbness / weakness.  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  Recommended to initiate DAPT for 3 months.  Recommended 4 to 6-week follow-up imaging in regards to possible dissection.  HTN stable.  LDL 191 initiate atorvastatin 80 mg daily.  Other stroke risk factors include prior EtOH use and obesity but no prior stroke history.  Discharged home in stable condition without therapy needs.  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  No mass or hemorrhage. Mucosal thickening noted in several ethmoid air cells. Probable cerumen in the right external auditory canal.   MRI head - Negative for acute infarct. Mild chronic white matter changes most likely due to small vessel ischemia.   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 MRI recommended to exclude acute infarct.   CT Perfusion - not ordered  Carotid Doppler - CTA neck performed - carotid dopplers not indicated.  2D Echo - EF 55-60  Lacey Jensen Virus 2  - negative  LDL - 191  HgbA1c  - 5.3  She presents today for follow up regarding recent admission for stroke accompanied by her husband.  Since discharge, reports continued left-sided weakness, gait impairment and episodes of dizziness/vertigo worsened with head movement.  She also complains of neck pain and left orbital headache which has been persistent since hospitalization.  Denies new or worsening stroke/TIA symptoms.  Currently working with PT and recently referred to OT by PCP and is currently waiting to schedule visits.  Ambulates with cane and denies any recent falls.  She has not returned back to work at [employer] and plans on obtaining short-term disability through PCP.  Has remained on DAPT without bleeding or bruising.  Continues on atorvastatin 80 mg daily.  Blood pressure today 127/89.  No further concerns at this time.      ROS:   14 system review of systems performed and negative with exception of weakness, pain, dizziness, headache  PMH:  Past Medical History:  Diagnosis Date  . Fibromyalgia   . IBS (irritable bowel syndrome)     PSH: No past surgical history on file.  Social History:  Social History   Socioeconomic History  . Marital status: Married    Spouse name: Not on file  . Number of children: Not on file  . Years of education: Not on file  . Highest education level: Not on file  Occupational History  . Not on file  Tobacco Use  . Smoking status: Passive Smoke Exposure -  Never Smoker  . Smokeless tobacco: Never Used  Substance and Sexual Activity  . Alcohol use: Not Currently  . Drug use: Never  . Sexual activity: Yes    Partners: Male  Other Topics Concern  . Not on file  Social History Narrative  . Not on file   Social Determinants of Health   Financial Resource Strain:   . Difficulty of Paying Living Expenses:   Food Insecurity:   . Worried About Programme researcher, broadcasting/film/video in the Last Year:   . Barista in the Last Year:   Transportation Needs:   . Automotive engineer (Medical):   Marland Kitchen Lack of Transportation (Non-Medical):   Physical Activity:   . Days of Exercise per Week:   . Minutes of Exercise per Session:   Stress:   . Feeling of Stress :   Social Connections:   . Frequency of Communication with Friends and Family:   . Frequency of Social Gatherings with Friends and Family:   . Attends Religious Services:   . Active Member of Clubs or Organizations:   . Attends Banker Meetings:   Marland Kitchen Marital Status:   Intimate Partner Violence:   . Fear of Current or Ex-Partner:   . Emotionally Abused:   Marland Kitchen Physically Abused:   . Sexually Abused:     Family History: No family history on file.  Medications:   Current Outpatient Medications on File Prior to Visit  Medication Sig Dispense Refill  . aspirin EC 81 MG EC tablet Take 1 tablet (81 mg total) by mouth daily. 30 tablet 2  . atorvastatin (LIPITOR) 80 MG tablet Take 1 tablet (80 mg total) by mouth daily. 30 tablet 1  . clopidogrel (PLAVIX) 75 MG tablet Take 1 tablet (75 mg total) by mouth daily. 30 tablet 2   No current facility-administered medications on file prior to visit.    Allergies:  No Known Allergies    OBJECTIVE:  Physical Exam  Vitals:   07/27/19 1458 07/27/19 1459  BP: (!) 126/93 127/89  Pulse: 93 89  Weight: 185 lb (83.9 kg)   Height: 5\' 6"  (1.676 m)    Body mass index is 29.86 kg/m. No exam data present  General: well developed, well nourished,  pleasant middle-age Caucasian female, seated, in no evident distress Head: head normocephalic and atraumatic.   Neck: supple with no carotid or supraclavicular bruits Cardiovascular: regular rate and rhythm, no murmurs Musculoskeletal: no deformity Skin:  no rash/petichiae Vascular:  Normal pulses all extremities   Neurologic Exam Mental Status: Awake and fully alert.   Fluent speech and language.  Oriented to place and time. Recent and remote memory intact. Attention span, concentration and fund of  knowledge appropriate. Mood and affect appropriate.  Cranial Nerves: Fundoscopic exam reveals sharp disc margins. Pupils equal, briskly reactive to light. Extraocular movements full without nystagmus.  Intolerant of tracking with left eye towards the left due to worsening dizziness.  Visual fields full to confrontation. Hearing intact. Facial sensation intact. Face, tongue, palate moves normally and symmetrically.  Motor: Normal bulk and tone.  Full strength right upper and lower extremity.  Potentially mild left-sided weakness with giveaway weakness noted an increase deltoid and hips tone Sensory.:  Hypersensitivity with light touch left upper and lower extremity.  Coordination: Rapid alternating movements normal in all extremities. Finger-to-nose slowed movement on left but no evidence of ataxia or dysmetria and heel-to-shin performed accurately on right side with difficulty on the left  Gait and Station: Arises from chair without difficulty. Stance is normal. Gait demonstrates normal stride length with imbalance and use of cane.  Tandem walk and Romberg not attempted Reflexes: 1+ and symmetric. Toes downgoing.     NIHSS  2 Modified Rankin  3      ASSESSMENT: Hala Narula is a 54 y.o. 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  MRI negative for stroke. Vascular risk factors include fibromyalgia, HLD and prior EtOH use.  Dissection likely due to her work environment with frequent extending of neck and lifting heavy objects.  Continues to experience subjective left-sided weakness with hemisensory impairment and increased tone, dizziness/vertigo with head movements and imbalance     PLAN:  1. R VA dissection:  -Repeat CTA neck for follow-up imaging of dissection.  Ongoing use of aspirin and Plavix will be determined  based on imaging report. -Continue to participate in outpatient therapies PT/OT for residual deficits.  Agree with short-term disability currently provided by PCP due to safety concerns and neck precautions with returning to work -Increased tone interfering with daily activity and possible recovery.  Recommend trial of baclofen 5 mg 3 times daily as needed.  Discussion regarding potential side effects with additional information provided and patient agreed to proceed -Continue aspirin 81 mg daily and clopidogrel 75 mg daily  and atorvastatin for secondary stroke prevention. -Discussed neck precautions with avoidance of rapid movements or activities that could cause impact.  Advised  neck pain and orbital pain likely due to dissection and should improve over time.  May use Tylenol as needed 2. HLD: Continue atorvastatin and continue to follow with PCP for prescribing, monitoring and management    Follow up in 3 months or call earlier if needed   I spent 45 minutes of face-to-face and non-face-to-face time with patient.  This included previsit chart review, lab review, study review, order entry, electronic health record documentation, patient education regarding R VA dissection, residual deficits, importance of managing stroke risk factors and answered all questions to patient and husband satisfaction   Ihor Austin, AGNP-BC  [facility] [address] [place], [place] [address]  Phone [phone] Fax [phone] Note: This document was prepared with digital dictation and possible smart phrase technology. Any transcriptional errors that result from this process are unintentional.

## 2019-07-27 NOTE — Progress Notes (Signed)
I agree with the above plan 

## 2019-07-27 NOTE — Patient Instructions (Addendum)
Continue aspirin 81 mg daily and clopidogrel 75 mg daily  and lipitor  for secondary stroke prevention  Continue to follow up with PCP regarding cholesterol and blood pressure management   Continue to work with therapies for ongoing residual deficits  Try use of baclofen 5mg  three times daily for muscle pain and stiffness  You will be called to schedule repeat imaging of your vertebral vessel for evaluation of your dissection   Continue to monitor blood pressure at home  Maintain strict control of hypertension with blood pressure goal below 130/90, diabetes with hemoglobin A1c goal below 6.5% and cholesterol with LDL cholesterol (bad cholesterol) goal below 70 mg/dL. I also advised the patient to eat a healthy diet with plenty of whole grains, cereals, fruits and vegetables, exercise regularly and maintain ideal body weight.  Followup in the future with me in 3 months or call earlier if needed       Thank you for coming to see [place] at [facility]. I hope we have been able to provide you high quality care today.  You may receive a patient satisfaction survey over the next few weeks. We would appreciate your feedback and comments so that we may continue to improve ourselves and the health of our patients.   Baclofen tablets What is this medicine? BACLOFEN (BAK loe fen) helps relieve spasms and cramping of muscles. It may be used to treat symptoms of multiple sclerosis or spinal cord injury. This medicine may be used for other purposes; ask your health care provider or pharmacist if you have questions. COMMON BRAND NAME(S): ED Baclofen, Lioresal What should I tell my health care provider before I take this medicine? They need to know if you have any of these conditions:  kidney disease  seizures  stroke  an unusual or allergic reaction to baclofen, other medicines, foods, dyes, or preservatives  pregnant or trying to get pregnant  breast-feeding How should I  use this medicine? Take this medicine by mouth. Swallow it with a drink of water. Follow the directions on the prescription label. Do not take more medicine than you are told to take. Talk to your pediatrician regarding the use of this medicine in children. Special care may be needed. Overdosage: If you think you have taken too much of this medicine contact a poison control center or emergency room at once. NOTE: This medicine is only for you. Do not share this medicine with others. What if I miss a dose? If you miss a dose, take it as soon as you can. If it is almost time for your next dose, take only that dose. Do not take double or extra doses. What may interact with this medicine? Do not take this medication with any of the following medicines:  narcotic medicines for cough This medicine may also interact with the following medications:  alcohol  antihistamines for allergy, cough and cold  certain medicines for anxiety or sleep  certain medicines for depression like amitriptyline, fluoxetine, sertraline  certain medicines for seizures like phenobarbital, primidone  general anesthetics like halothane, isoflurane, methoxyflurane, propofol  local anesthetics like lidocaine, pramoxine, tetracaine  medicines that relax muscles for surgery  narcotic medicines for pain  phenothiazines like chlorpromazine, mesoridazine, prochlorperazine, thioridazine This list may not describe all possible interactions. Give your health care provider a list of all the medicines, herbs, non-prescription drugs, or dietary supplements you use. Also tell them if you smoke, drink alcohol, or use illegal drugs. Some items may interact with  your medicine. What should I watch for while using this medicine? Tell your doctor or health care professional if your symptoms do not start to get better or if they get worse. Do not suddenly stop taking your medicine. If you do, you may develop a severe reaction. If your  doctor wants you to stop the medicine, the dose will be slowly lowered over time to avoid any side effects. Follow the advice of your doctor. You may get drowsy or dizzy. Do not drive, use machinery, or do anything that needs mental alertness until you know how this medicine affects you. Do not stand or sit up quickly, especially if you are an older patient. This reduces the risk of dizzy or fainting spells. Alcohol may interfere with the effect of this medicine. Avoid alcoholic drinks. If you are taking another medicine that also causes drowsiness, you may have more side effects. Give your health care provider a list of all medicines you use. Your doctor will tell you how much medicine to take. Do not take more medicine than directed. Call emergency for help if you have problems breathing or unusual sleepiness. What side effects may I notice from receiving this medicine? Side effects that you should report to your doctor or health care professional as soon as possible:  allergic reactions like skin rash, itching or hives, swelling of the face, lips, or tongue  breathing problems  changes in emotions or moods  changes in vision  chest pain  fast, irregular heartbeat  feeling faint or lightheaded, falls  hallucinations  loss of balance or coordination  ringing of the ears  seizures  trouble passing urine or change in the amount of urine  trouble walking  unusually weak or tired Side effects that usually do not require medical attention (report to your doctor or health care professional if they continue or are bothersome):  changes in taste  confusion  constipation  diarrhea  dry mouth  headache  muscle weakness  nausea, vomiting  trouble sleeping This list may not describe all possible side effects. Call your doctor for medical advice about side effects. You may report side effects to FDA at 1-800-FDA-1088. Where should I keep my medicine? Keep out of the reach of  children. Store at room temperature between 15 and 30 degrees C (59 and 86 degrees F). Keep container tightly closed. Throw away any unused medicine after the expiration date. NOTE: This sheet is a summary. It may not cover all possible information. If you have questions about this medicine, talk to your doctor, pharmacist, or health care provider.  2020 Elsevier/Gold Standard (2016-11-02 09:56:42)

## 2019-07-30 ENCOUNTER — Ambulatory Visit: Payer: BC Managed Care – PPO

## 2019-07-30 ENCOUNTER — Other Ambulatory Visit: Payer: Self-pay

## 2019-07-30 DIAGNOSIS — R2689 Other abnormalities of gait and mobility: Secondary | ICD-10-CM

## 2019-07-30 DIAGNOSIS — M6281 Muscle weakness (generalized): Secondary | ICD-10-CM | POA: Diagnosis not present

## 2019-07-30 DIAGNOSIS — R2681 Unsteadiness on feet: Secondary | ICD-10-CM

## 2019-07-30 NOTE — Therapy (Signed)
[facility] [address] [place], [place], [address] Phone: [phone]   Fax:  [phone]  Physical Therapy Treatment  Patient Details  Name: Kimberly [name] MRN: [id number] Date of Birth: May 20, 1965 Referring Provider (PT): Edsel Petrin, DO   Encounter Date: 07/30/2019   PT End of Session - 07/30/19 1156    Visit Number 7    Number of Visits 13    Date for PT Re-Evaluation 10/07/19   POC for 6 weeks, Cert for 90 days   Authorization Type BCBS    PT Start Time 1152   pt arriving late   PT Stop Time 1230    PT Time Calculation (min) 38 min    Equipment Utilized During Treatment Gait belt    Activity Tolerance Patient tolerated treatment well    Behavior During Therapy [facility] for tasks assessed/performed           Past Medical History:  Diagnosis Date  . Fibromyalgia   . IBS (irritable bowel syndrome)     History reviewed. No pertinent surgical history.  There were no vitals filed for this visit.   Subjective Assessment - 07/30/19 1155    Subjective Patient reports that got lab results, Vitamin D was slow and is now taking Vitamin D supplement. Soreness down the L lateral leg.    Patient is accompained by: Family member   Husband   Pertinent History Fibromyalgia and IBS    Limitations Walking;Standing;Lifting    How long can you walk comfortably? 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 MRI brain negative for stroke    Patient Stated Goals Be able to go back to "normal", go back to work    Currently in Pain? No/denies                             [facility] Adult PT Treatment/Exercise - 07/30/19 0001      Transfers   Transfers Sit to Stand;Stand to Sit    Sit to Stand 5: Supervision    Stand to Sit 5: Supervision    Comments Compelted sit <> stand x 5 reps with LLE positioned  posterior for improved strengthening. Educated to complete at home with HEP.       Ambulation/Gait   Ambulation/Gait Yes    Ambulation/Gait Assistance 5: Supervision;4: Min guard    Ambulation/Gait Assistance Details Completed gait training without AD today, PT providing verbal cues for improved step length, and manua faciliation for improved step length.     Ambulation Distance (Feet) 230 Feet    Assistive device None    Gait Pattern Step-through pattern;Decreased arm swing - left;Decreased arm swing - right;Decreased step length - right;Decreased step length - left;Decreased stance time - left;Decreased hip/knee flexion - left;Decreased dorsiflexion - left;Decreased weight shift to left;Poor foot clearance - left    Ambulation Surface Level;Indoor      Self-Care   Self-Care Other Self-Care Comments    Other Self-Care Comments  Educated on Land Peddler to allow for improved activity tolerance, endurance training, and BLE strengthening.       Exercises   Exercises Knee/Hip    Other Exercises  Completed side stepping, leading with LLE in // bars x 4 laps. Focus on improved lateral stepping and weight shift/acceptance on LLE.  Initially completed w/ BUE and progressed  to no UE support.       Knee/Hip Exercises: Aerobic   Other Aerobic Completed SciFit on Level 1.0 x 7 minutes. Focus on reciprocal movement, improved strength of LLE and LUE. Completed with BLE and BUE's. Slowed pace due to weakness and fatigue.       Knee/Hip Exercises: Standing   Forward Step Up Left;1 set;10 reps;Hand Hold: 2;Step Height: 6"    Forward Step Up Limitations completed in // bars. Increased time required, and small rest break after 5 reps needed.                    PT Short Term Goals - 07/09/19 1057      PT SHORT TERM GOAL #1   Title Patient will be independent with Initial HEP    Time 3    Period Weeks    Status New    Target Date 07/30/19      PT SHORT TERM GOAL #2     Title Patient will demonstrate ability to ambulate >/= 350 ft with LRAD before require rest break to demo improved functional mobility and endurance    Baseline 75'    Time 3    Period Weeks    Status New    Target Date 07/30/19      PT SHORT TERM GOAL #3   Title Patient will demonstrate ability to complete 5x sit <> stands in 30 seconds w/ UE support    Baseline 2 sit <> stands    Time 3    Period Weeks    Status New    Target Date 07/30/19      PT SHORT TERM GOAL #4   Title Berg Balance and LTG to be set as appropriate    Time 3    Period Weeks    Status New    Target Date 07/30/19             PT Long Term Goals - 07/12/19 1456      PT LONG TERM GOAL #1   Title Pt will be independent with Final HEP    Time 6    Period Weeks    Status New      PT LONG TERM GOAL #2   Title Patient will be able to ascend/descend 6 stairs Mod I with alternating pattern and single rail for improved ability to enter/exit home    Baseline 4, step to, single rail, Min Guard    Time 6    Period Weeks    Status New      PT LONG TERM GOAL #3   Title Patient will demo ability to complete 10 sit <> stands in 30 seconds with UE support to demo improved LE strength/endurance    Baseline 2 sit <> stands    Time 6    Period Weeks    Status New      PT LONG TERM GOAL #4   Title Patient will demonstrate ability to ambulate >/= 500 ft on indoor/outdoor surfaces with LRAD before require rest break to demo improved functional mobility and endurance    Baseline 75    Time 6    Period Weeks    Status New      PT LONG TERM GOAL #5   Title Patient will improved Berg Balance to >/= 48/56 points to demonstrate improvements in overall balance    Baseline 41/56    Time 6    Period Weeks    Status New  Plan - 07/30/19 1249    Clinical Impression Statement Continued gait training w/o AD, with continued focus on improved step length and improved gait speed. Also continued  activites to promote weight acceptance on LLE. With increased fatigue, pt require rest breaks due to LLE shakiness. Compelted SciFit with BLE and BUE, patient require increased time for completion due to weakness.    Personal Factors and Comorbidities Comorbidity 2    Comorbidities Fibromyalgia and IBS    Examination-Activity Limitations Bend;Squat;Stairs;Stand;Lift    Examination-Participation Restrictions Cleaning;Community Activity;Driving   Work   Stability/Clinical Decision Making Evolving/Moderate complexity    Rehab Potential Good    PT Frequency 2x / week    PT Duration 6 weeks    PT Treatment/Interventions ADLs/Self Care Home Management;Electrical Stimulation;Moist Heat;Cryotherapy;DME Instruction;Gait training;Stair training;Functional mobility training;Therapeutic activities;Therapeutic exercise;Balance training;Neuromuscular re-education;Patient/family education;Orthotic Fit/Training;Passive range of motion    PT Next Visit Plan SciFit. Strengthening, Gait training. Balance exercises    Consulted and Agree with Plan of Care Patient;Family member/caregiver    Family Member Consulted Husband           Patient will benefit from skilled therapeutic intervention in order to improve the following deficits and impairments:  Abnormal gait, Decreased coordination, Difficulty walking, Decreased endurance, Decreased activity tolerance, Pain, Decreased balance, Decreased mobility, Decreased strength, Impaired sensation  Visit Diagnosis: Muscle weakness (generalized)  Other abnormalities of gait and mobility  Unsteadiness on feet     Problem List Patient Active Problem List   Diagnosis Date Noted  . 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 Near syncope 07/02/2019  . Passive smoke exposure 07/02/2019    Jones Bales, PT, DPT 07/30/2019, 12:52 PM  [facility] [address] [place], [place], [address] Phone:  [phone]   Fax:  [phone]  Name: Kimberly [name] MRN: [id number] Date of Birth: [birth year]

## 2019-08-02 ENCOUNTER — Ambulatory Visit: Payer: BC Managed Care – PPO

## 2019-08-02 ENCOUNTER — Other Ambulatory Visit: Payer: Self-pay

## 2019-08-02 DIAGNOSIS — R2689 Other abnormalities of gait and mobility: Secondary | ICD-10-CM

## 2019-08-02 DIAGNOSIS — M6281 Muscle weakness (generalized): Secondary | ICD-10-CM | POA: Diagnosis not present

## 2019-08-02 DIAGNOSIS — R2681 Unsteadiness on feet: Secondary | ICD-10-CM

## 2019-08-02 NOTE — Therapy (Signed)
[facility] [address] [place], [place], [address] Phone: [phone]   Fax:  [phone]  Physical Therapy Treatment  Patient Details  Name: Kimberly [name] MRN: [id number] Date of Birth: Jan 14, 1966 Referring Provider (PT): Edsel Petrin, DO   Encounter Date: 08/02/2019   PT End of Session - 08/02/19 1311    Visit Number 8    Number of Visits 13    Date for PT Re-Evaluation 10/07/19   POC for 6 weeks, Cert for 90 days   Authorization Type BCBS    PT Start Time 1237   pt arriving late   PT Stop Time 1315    PT Time Calculation (min) 38 min    Equipment Utilized During Treatment Gait belt    Activity Tolerance Patient tolerated treatment well    Behavior During Therapy WFL for tasks assessed/performed           Past Medical History:  Diagnosis Date   Fibromyalgia    IBS (irritable bowel syndrome)     History reviewed. No pertinent surgical history.  There were no vitals filed for this visit.   Subjective Assessment - 08/02/19 1239    Subjective Patient reports that had a good weekend, no new issues or complaints. Reports had a busy morning.    Patient is accompained by: Family member   Husband   Pertinent History Fibromyalgia and IBS    Limitations Walking;Standing;Lifting    How long can you walk comfortably? 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 MRI brain negative for stroke    Patient Stated Goals Be able to go back to "normal", go back to work    Currently in Pain? Yes    Pain Score 5     Pain Location Leg    Pain Orientation Left    Pain Descriptors / Indicators Sore                             [facility] Adult PT Treatment/Exercise - 08/02/19 0001      Transfers   Transfers Sit to Stand;Stand to Sit    Sit to Stand 5: Supervision    Stand to Sit 5: Supervision     Comments Completed 30 second chair test completed 4 sit <> stands w/ UE support from mat.       Ambulation/Gait   Ambulation/Gait Yes    Ambulation/Gait Assistance 5: Supervision;4: Min guard    Ambulation/Gait Assistance Details completed gait training w/ SPC. verbal/tactile cues focused on improved step length and improved arm swing with ambulation. Patient able to ambulate 460' prior to requiring rest break, verbal encouragement provided by PT.     Ambulation Distance (Feet) 460 Feet    Assistive device Straight cane    Gait Pattern Step-through pattern;Decreased arm swing - left;Decreased arm swing - right;Decreased step length - right;Decreased step length - left;Decreased stance time - left;Decreased hip/knee flexion - left;Decreased dorsiflexion - left;Decreased weight shift to left;Poor foot clearance - left    Ambulation Surface Level;Indoor      Exercises   Exercises Other Exercises    Other Exercises  Completed mini squats in // bars, with BUE 3 x 5 reps. Heavy reliance on UE support, and rest break required between sets. In // bars, completed standing alternating marching with BUE support from // bars, 1  x 15 reps.       Knee/Hip Exercises: Aerobic   Other Aerobic Completed SciFit on Level 1.5 x 6 minutes. Focus on improved BLE strength, specifically LLE. Focus on keepings RPM > 20. Slowed pace overall due to weakness.                Balance Exercises - 08/02/19 0001      Balance Exercises: Standing   Balance Beam Completed static standing on red balance beam with feet apart, 3 x 30 secs w/o UE support and eyes open.                PT Short Term Goals - 08/02/19 1242      PT SHORT TERM GOAL #1   Title Patient will be independent with Initial HEP    Baseline Reports independence with initial HEP    Time 3    Period Weeks    Status Achieved    Target Date 07/30/19      PT SHORT TERM GOAL #2   Title Patient will demonstrate ability to ambulate >/= 350 ft with  LRAD before require rest break to demo improved functional mobility and endurance    Baseline 75', 460' w/ SPC.    Time 3    Period Weeks    Status Achieved    Target Date 07/30/19      PT SHORT TERM GOAL #3   Title Patient will demonstrate ability to complete 5x sit <> stands in 30 seconds w/ UE support    Baseline 2 sit <> stands, 4 sit <> stands on 6/28    Time 3    Period Weeks    Status On-going    Target Date 07/30/19      PT SHORT TERM GOAL #4   Title Berg Balance and LTG to be set as appropriate    Baseline Assessed on 07/12/19    Time 3    Period Weeks    Status Achieved    Target Date 07/30/19             PT Long Term Goals - 07/12/19 1456      PT LONG TERM GOAL #1   Title Pt will be independent with Final HEP    Time 6    Period Weeks    Status New      PT LONG TERM GOAL #2   Title Patient will be able to ascend/descend 6 stairs Mod I with alternating pattern and single rail for improved ability to enter/exit home    Baseline 4, step to, single rail, Min Guard    Time 6    Period Weeks    Status New      PT LONG TERM GOAL #3   Title Patient will demo ability to complete 10 sit <> stands in 30 seconds with UE support to demo improved LE strength/endurance    Baseline 2 sit <> stands    Time 6    Period Weeks    Status New      PT LONG TERM GOAL #4   Title Patient will demonstrate ability to ambulate >/= 500 ft on indoor/outdoor surfaces with LRAD before require rest break to demo improved functional mobility and endurance    Baseline 75    Time 6    Period Weeks    Status New      PT LONG TERM GOAL #5   Title Patient will improved Berg Balance to >/= 48/56 points to demonstrate  improvements in overall balance    Baseline 41/56    Time 6    Period Weeks    Status New                 Plan - 08/02/19 1309    Clinical Impression Statement Today's skilled PT session included assessment of patient's progress toward STG's. Patient able to  meet 3 out of 4 STGs. Patient just one repetition short of missing 30 second chair stand goal, but overall demonstrating progres. Rest of session spent targeting BLE strengthening with patietn tolerating well. Patient will continue to benefit from skilled PT services to progress toward all unmet goals.    Personal Factors and Comorbidities Comorbidity 2    Comorbidities Fibromyalgia and IBS    Examination-Activity Limitations Bend;Squat;Stairs;Stand;Lift    Examination-Participation Restrictions Cleaning;Community Activity;Driving   Work   Stability/Clinical Decision Making Evolving/Moderate complexity    Rehab Potential Good    PT Frequency 2x / week    PT Duration 6 weeks    PT Treatment/Interventions ADLs/Self Care Home Management;Electrical Stimulation;Moist Heat;Cryotherapy;DME Instruction;Gait training;Stair training;Functional mobility training;Therapeutic activities;Therapeutic exercise;Balance training;Neuromuscular re-education;Patient/family education;Orthotic Fit/Training;Passive range of motion    PT Next Visit Plan SciFit. Strengthening, Gait training. Balance exercises    Consulted and Agree with Plan of Care Patient;Family member/caregiver    Family Member Consulted Husband           Patient will benefit from skilled therapeutic intervention in order to improve the following deficits and impairments:  Abnormal gait, Decreased coordination, Difficulty walking, Decreased endurance, Decreased activity tolerance, Pain, Decreased balance, Decreased mobility, Decreased strength, Impaired sensation  Visit Diagnosis: Muscle weakness (generalized)  Other abnormalities of gait and mobility  Unsteadiness on feet     Problem List Patient Active Problem List   Diagnosis Date Noted   Vertebral artery dissection ([facility]) 07/02/2019   Near syncope 07/02/2019   Passive smoke exposure 07/02/2019    Tempie Donning, PT, DPT 08/02/2019, 1:12 PM  [facility] [address] [place], [place], [address] Phone: [phone]   Fax:  [phone]  Name: Kimberly [name] MRN: [id number] Date of Birth: [birth year]

## 2019-08-03 ENCOUNTER — Telehealth: Payer: Self-pay | Admitting: Adult Health

## 2019-08-03 NOTE — Telephone Encounter (Signed)
BCBS Auth: [id number] (exp. 08/03/19 to 10/01/19) faxed order to [facility] they will reach out to the patient to schedule  Ph  # [phone] & fax # [phone].

## 2019-08-04 NOTE — Telephone Encounter (Signed)
schedule for 08/06/19 at 2:30 pm

## 2019-08-05 ENCOUNTER — Other Ambulatory Visit: Payer: Self-pay

## 2019-08-05 ENCOUNTER — Ambulatory Visit: Payer: BC Managed Care – PPO | Attending: Internal Medicine

## 2019-08-05 DIAGNOSIS — R2689 Other abnormalities of gait and mobility: Secondary | ICD-10-CM | POA: Insufficient documentation

## 2019-08-05 DIAGNOSIS — M6281 Muscle weakness (generalized): Secondary | ICD-10-CM | POA: Insufficient documentation

## 2019-08-05 DIAGNOSIS — I69954 Hemiplegia and hemiparesis following unspecified cerebrovascular disease affecting left non-dominant side: Secondary | ICD-10-CM | POA: Diagnosis present

## 2019-08-05 DIAGNOSIS — R41842 Visuospatial deficit: Secondary | ICD-10-CM | POA: Insufficient documentation

## 2019-08-05 DIAGNOSIS — R41840 Attention and concentration deficit: Secondary | ICD-10-CM | POA: Insufficient documentation

## 2019-08-05 DIAGNOSIS — R209 Unspecified disturbances of skin sensation: Secondary | ICD-10-CM | POA: Diagnosis present

## 2019-08-05 DIAGNOSIS — R2681 Unsteadiness on feet: Secondary | ICD-10-CM | POA: Diagnosis present

## 2019-08-05 DIAGNOSIS — M25622 Stiffness of left elbow, not elsewhere classified: Secondary | ICD-10-CM | POA: Insufficient documentation

## 2019-08-05 NOTE — Patient Instructions (Signed)
Access Code: [email or web address] URL: [email or web address] Date: 08/05/2019 Prepared by: Jethro Bastos  Exercises Sit to Stand with Hands on Knees - 1 x daily - 7 x weekly - 3 sets - 5 reps Supine Bridge - 1 x daily - 7 x weekly - 3 sets - 5 reps Clamshell - 1 x daily - 7 x weekly - 2 sets - 10 reps Supine Heel Slide - 1 x daily - 7 x weekly - 2 sets - 10 reps Seated March - 1 x daily - 7 x weekly - 2 sets - 10 reps Seated Toe Raise - 1 x daily - 7 x weekly - 2 sets - 10 reps Tandem Stance - 1 x daily - 7 x weekly - 1 sets - 3 reps - 30 hold Romberg Stance with Eyes Closed - 1 x daily - 7 x weekly - 1 sets - 3 reps - 30 hold Standing on Foam Pad - 1 x daily - 7 x weekly - 1 sets - 3 reps - 30 hold Walking March with Countertop Support - 1 x daily - 7 x weekly - 3 sets - 10 reps Seated Gastroc Stretch with Strap - 1 x daily - 7 x weekly - 1 sets - 3 reps - 30 hold Supine Lower Trunk Rotation - 1 x daily - 7 x weekly - 1 sets - 10 reps - 30 seconds hold Modified Thomas Stretch - 1 x daily - 7 x weekly - 1 sets - 3 reps - 30 secs - 1 min hold Seated Hamstring Stretch - 1 x daily - 7 x weekly - 1 sets - 3 reps - 30-45 seconds hold

## 2019-08-05 NOTE — Therapy (Signed)
[facility] [address] [place], [place], [address] Phone: [phone]   Fax:  [phone]  Physical Therapy Treatment  Patient Details  Name: Kimberly [name] MRN: [id number] Date of Birth: 12/12/1965 Referring Provider (PT): Edsel Petrin, DO   Encounter Date: 08/05/2019   PT End of Session - 08/05/19 1111    Visit Number 9    Number of Visits 13    Date for PT Re-Evaluation 10/07/19   POC for 6 weeks, Cert for 90 days   Authorization Type BCBS    PT Start Time 1107   pt arriving late   PT Stop Time 1145    PT Time Calculation (min) 38 min    Equipment Utilized During Treatment Gait belt    Activity Tolerance Patient tolerated treatment well    Behavior During Therapy [facility] for tasks assessed/performed           Past Medical History:  Diagnosis Date  . Fibromyalgia   . IBS (irritable bowel syndrome)     History reviewed. No pertinent surgical history.  There were no vitals filed for this visit.   Subjective Assessment - 08/05/19 1109    Subjective Patient reports that she tried to dye her hair at home, reports she believe she over did it with using the LUE and bending over. Patient report she had some dizziness and balance isuses with it.    Patient is accompained by: Family member   Husband   Pertinent History Fibromyalgia and IBS    Limitations Walking;Standing;Lifting    How long can you walk comfortably? 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 MRI brain negative for stroke    Patient Stated Goals Be able to go back to "normal", go back to work    Currently in Pain? Yes    Pain Score 5     Pain Location Leg    Pain Orientation Left    Pain Descriptors / Indicators Sore                             [facility] Adult PT Treatment/Exercise - 08/05/19 1117      Transfers    Transfers Sit to Stand;Stand to Sit    Sit to Stand 5: Supervision    Stand to Sit 5: Supervision    Comments Completed sit <> stand training with 2" block under RLE to promote LLE weight shifting and strenthening. Completed 2 x 10 reps, with extended rest break between sets.       Ambulation/Gait   Ambulation/Gait Yes    Ambulation/Gait Assistance 4: Min guard    Ambulation/Gait Assistance Details completed gait training w/o AD. PT providing verbal cues for improved step length, and improved arm swing. PT providing manual faciliation to the pelvis for improved step length and improved weight shift.     Ambulation Distance (Feet) 345 Feet    Assistive device None    Gait Pattern Step-through pattern;Decreased arm swing - left;Decreased arm swing - right;Decreased step length - right;Decreased step length - left;Decreased stance time - left;Decreased hip/knee flexion - left;Decreased dorsiflexion - left;Decreased weight shift to left;Poor foot clearance - left    Ambulation Surface Level;Indoor      Exercises   Exercises Other Exercises    Other Exercises  Completed RLE stretching due to patient's report of  increased muscle tightness/spasms. Completed the following stretching activties: including modified thomas stretch on mat, 3 x 1 min each. Completed alternating trunk rotations 1 x 10 reps. Completed hamstring stretch 3 x 30 seconds on RLE. PT educating on proper completion of all stretches for proper completion at home. PT providing manual stretching to R gastroc, 3 x 45 seconds as tolerated by patient.                   PT Education - 08/05/19 1217    Education Details Educated on HEP Update (see patient instructions; new additions highlighed)    Person(s) Educated Patient;Spouse    Methods Explanation;Demonstration;Handout    Comprehension Verbalized understanding;Returned demonstration            PT Short Term Goals - 08/02/19 1242      PT SHORT TERM GOAL #1   Title  Patient will be independent with Initial HEP    Baseline Reports independence with initial HEP    Time 3    Period Weeks    Status Achieved    Target Date 07/30/19      PT SHORT TERM GOAL #2   Title Patient will demonstrate ability to ambulate >/= 350 ft with LRAD before require rest break to demo improved functional mobility and endurance    Baseline 75', 460' w/ SPC.    Time 3    Period Weeks    Status Achieved    Target Date 07/30/19      PT SHORT TERM GOAL #3   Title Patient will demonstrate ability to complete 5x sit <> stands in 30 seconds w/ UE support    Baseline 2 sit <> stands, 4 sit <> stands on 6/28    Time 3    Period Weeks    Status On-going    Target Date 07/30/19      PT SHORT TERM GOAL #4   Title Berg Balance and LTG to be set as appropriate    Baseline Assessed on 07/12/19    Time 3    Period Weeks    Status Achieved    Target Date 07/30/19             PT Long Term Goals - 07/12/19 1456      PT LONG TERM GOAL #1   Title Pt will be independent with Final HEP    Time 6    Period Weeks    Status New      PT LONG TERM GOAL #2   Title Patient will be able to ascend/descend 6 stairs Mod I with alternating pattern and single rail for improved ability to enter/exit home    Baseline 4, step to, single rail, Min Guard    Time 6    Period Weeks    Status New      PT LONG TERM GOAL #3   Title Patient will demo ability to complete 10 sit <> stands in 30 seconds with UE support to demo improved LE strength/endurance    Baseline 2 sit <> stands    Time 6    Period Weeks    Status New      PT LONG TERM GOAL #4   Title Patient will demonstrate ability to ambulate >/= 500 ft on indoor/outdoor surfaces with LRAD before require rest break to demo improved functional mobility and endurance    Baseline 75    Time 6    Period Weeks    Status New  PT LONG TERM GOAL #5   Title Patient will improved Berg Balance to >/= 48/56 points to demonstrate  improvements in overall balance    Baseline 41/56    Time 6    Period Weeks    Status New                 Plan - 08/05/19 1221    Clinical Impression Statement Continued gait training and functional sit <> stand training with focus on improved weight shift to LLE and improved step length with ambulation. Due to patient reports of increased muscle tightness, focused rest of session on stretching program to the lower extremitys, and educating on proper completion at home. Updated HEP to include stretches completed in session today. Patient will continue to benefit from skilled PT services to progress toward all goals.    Personal Factors and Comorbidities Comorbidity 2    Comorbidities Fibromyalgia and IBS    Examination-Activity Limitations Bend;Squat;Stairs;Stand;Lift    Examination-Participation Restrictions Cleaning;Community Activity;Driving   Work   Stability/Clinical Decision Making Evolving/Moderate complexity    Rehab Potential Good    PT Frequency 2x / week    PT Duration 6 weeks    PT Treatment/Interventions ADLs/Self Care Home Management;Electrical Stimulation;Moist Heat;Cryotherapy;DME Instruction;Gait training;Stair training;Functional mobility training;Therapeutic activities;Therapeutic exercise;Balance training;Neuromuscular re-education;Patient/family education;Orthotic Fit/Training;Passive range of motion    PT Next Visit Plan Continue SciFit for strengthening/ednurance. Gait training w/o AD. LLE Strengthening. Balance Exercises. How was HEP Update?    Consulted and Agree with Plan of Care Patient;Family member/caregiver    Family Member Consulted Husband           Patient will benefit from skilled therapeutic intervention in order to improve the following deficits and impairments:  Abnormal gait, Decreased coordination, Difficulty walking, Decreased endurance, Decreased activity tolerance, Pain, Decreased balance, Decreased mobility, Decreased strength, Impaired  sensation  Visit Diagnosis: Muscle weakness (generalized)  Other abnormalities of gait and mobility  Unsteadiness on feet     Problem List Patient Active Problem List   Diagnosis Date Noted  . 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 Near syncope 07/02/2019  . Passive smoke exposure 07/02/2019    Tempie Donning, PT, DPT 08/05/2019, 12:25 PM  [facility] [address] [place], [place], [address] Phone: [phone]   Fax:  [phone]  Name: Kimberly [name] MRN: [id number] Date of Birth: May 07, 1965

## 2019-08-10 ENCOUNTER — Other Ambulatory Visit: Payer: Self-pay

## 2019-08-10 ENCOUNTER — Ambulatory Visit: Payer: BC Managed Care – PPO

## 2019-08-10 DIAGNOSIS — M6281 Muscle weakness (generalized): Secondary | ICD-10-CM | POA: Diagnosis not present

## 2019-08-10 DIAGNOSIS — R2681 Unsteadiness on feet: Secondary | ICD-10-CM

## 2019-08-10 DIAGNOSIS — R2689 Other abnormalities of gait and mobility: Secondary | ICD-10-CM

## 2019-08-10 NOTE — Therapy (Signed)
[facility] [address] [place], [place], [address] Phone: [phone]   Fax:  [phone]  Physical Therapy Treatment  Patient Details  Name: Kimberly [name] MRN: [id number] Date of Birth: [birth year] Referring Provider (PT): Edsel Petrin, DO   Encounter Date: 08/10/2019   PT End of Session - 08/10/19 1114    Visit Number 10    Number of Visits 13    Date for PT Re-Evaluation 10/07/19   POC for 6 weeks, Cert for 90 days   Authorization Type BCBS    PT Start Time 1113   pt arriving late   PT Stop Time 1145    PT Time Calculation (min) 32 min    Equipment Utilized During Treatment Gait belt    Activity Tolerance Patient tolerated treatment well    Behavior During Therapy [facility] for tasks assessed/performed           Past Medical History:  Diagnosis Date  . Fibromyalgia   . IBS (irritable bowel syndrome)     No past surgical history on file.  There were no vitals filed for this visit.   Subjective Assessment - 08/10/19 1115    Subjective Patient reports that she had CTA of neck. No changes noted. Still having some pain on the L side. Patient reports that she has had some vertigo. patient reports stretching additions to HEP went well.    Patient is accompained by: Family member   Husband   Pertinent History Fibromyalgia and IBS    Limitations Walking;Standing;Lifting    How long can you walk comfortably? 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 MRI brain negative for stroke    Patient Stated Goals Be able to go back to "normal", go back to work    Currently in Pain? No/denies                             [facility] Adult PT Treatment/Exercise - 08/10/19 1120      Transfers   Transfers Sit to Stand;Stand to Sit    Sit to Stand 5: Supervision    Stand to Sit 5: Supervision    Comments  completed sit <> stand training, 1 x 10 reps with LLE postioned posterior for improved strengthening      Ambulation/Gait   Ambulation/Gait Yes    Ambulation/Gait Assistance 4: Min guard;5: Supervision    Ambulation/Gait Assistance Details completed initial gait training with PT providing manual faciliation to pelvis from improved step length and weight shift. Completed last 50 ft at supervision level.     Ambulation Distance (Feet) 345 Feet    Assistive device None    Gait Pattern Step-through pattern;Decreased arm swing - left;Decreased arm swing - right;Decreased step length - right;Decreased step length - left;Decreased stance time - left;Decreased hip/knee flexion - left;Decreased dorsiflexion - left;Decreased weight shift to left;Poor foot clearance - left    Ambulation Surface Level;Indoor      High Level Balance   High Level Balance Activities Side stepping    High Level Balance Comments completed side stepping with light handheld support from PT, 4 x laps x 20'. Patient demo increased difficulty with stepping to L side due to weakness and hesitancy to weight shift over to L side.       Self-Care   Self-Care Other Self-Care Comments  Other Self-Care Comments  Patient reporting recieved stationary peddler. PT educating to assess HR with completion to ensure we are exercising at appropriate % HR.       Exercises   Exercises Other Exercises    Other Exercises  Completed active DF, 1 x 10 reps with 3 second hold. Completed active eversion, with PT stabilizing ankle, 1 x 10 reps. Completing along the length of the mat, completed side stepping with mini squat, x 4 laps along length of the mat. Rest break required after 2 laps.                     PT Short Term Goals - 08/02/19 1242      PT SHORT TERM GOAL #1   Title Patient will be independent with Initial HEP    Baseline Reports independence with initial HEP    Time 3    Period Weeks    Status Achieved    Target Date  07/30/19      PT SHORT TERM GOAL #2   Title Patient will demonstrate ability to ambulate >/= 350 ft with LRAD before require rest break to demo improved functional mobility and endurance    Baseline 75', 460' w/ SPC.    Time 3    Period Weeks    Status Achieved    Target Date 07/30/19      PT SHORT TERM GOAL #3   Title Patient will demonstrate ability to complete 5x sit <> stands in 30 seconds w/ UE support    Baseline 2 sit <> stands, 4 sit <> stands on 6/28    Time 3    Period Weeks    Status On-going    Target Date 07/30/19      PT SHORT TERM GOAL #4   Title Berg Balance and LTG to be set as appropriate    Baseline Assessed on 07/12/19    Time 3    Period Weeks    Status Achieved    Target Date 07/30/19             PT Long Term Goals - 07/12/19 1456      PT LONG TERM GOAL #1   Title Pt will be independent with Final HEP    Time 6    Period Weeks    Status New      PT LONG TERM GOAL #2   Title Patient will be able to ascend/descend 6 stairs Mod I with alternating pattern and single rail for improved ability to enter/exit home    Baseline 4, step to, single rail, Min Guard    Time 6    Period Weeks    Status New      PT LONG TERM GOAL #3   Title Patient will demo ability to complete 10 sit <> stands in 30 seconds with UE support to demo improved LE strength/endurance    Baseline 2 sit <> stands    Time 6    Period Weeks    Status New      PT LONG TERM GOAL #4   Title Patient will demonstrate ability to ambulate >/= 500 ft on indoor/outdoor surfaces with LRAD before require rest break to demo improved functional mobility and endurance    Baseline 75    Time 6    Period Weeks    Status New      PT LONG TERM GOAL #5   Title Patient will improved Berg Balance to >/= 48/56 points  to demonstrate improvements in overall balance    Baseline 41/56    Time 6    Period Weeks    Status New                 Plan - 08/10/19 1416    Clinical Impression  Statement Continue to gait train without AD, working on improved step length, gait speed, and weight shift. All exrecises focused on improved weight shift and strengthening to LLE. Intermittent rest breaks as needed due to fatigue. Pt still demo increased shakiness on the LLE at this time with fatigue.    Personal Factors and Comorbidities Comorbidity 2    Comorbidities Fibromyalgia and IBS    Examination-Activity Limitations Bend;Squat;Stairs;Stand;Lift    Examination-Participation Restrictions Cleaning;Community Activity;Driving   Work   Stability/Clinical Decision Making Evolving/Moderate complexity    Rehab Potential Good    PT Frequency 2x / week    PT Duration 6 weeks    PT Treatment/Interventions ADLs/Self Care Home Management;Electrical Stimulation;Moist Heat;Cryotherapy;DME Instruction;Gait training;Stair training;Functional mobility training;Therapeutic activities;Therapeutic exercise;Balance training;Neuromuscular re-education;Patient/family education;Orthotic Fit/Training;Passive range of motion    PT Next Visit Plan Continue SciFit for strengthening/ednurance. Gait training w/o AD. LLE Strengthening. Balance Exercises. How was HEP Update?    Consulted and Agree with Plan of Care Patient;Family member/caregiver    Family Member Consulted Husband           Patient will benefit from skilled therapeutic intervention in order to improve the following deficits and impairments:  Abnormal gait, Decreased coordination, Difficulty walking, Decreased endurance, Decreased activity tolerance, Pain, Decreased balance, Decreased mobility, Decreased strength, Impaired sensation  Visit Diagnosis: Muscle weakness (generalized)  Other abnormalities of gait and mobility  Unsteadiness on feet     Problem List Patient Active Problem List   Diagnosis Date Noted  . 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 Near syncope 07/02/2019  . Passive smoke exposure 07/02/2019    Tempie Donning, PT, DPT 08/10/2019, 2:18 PM  [place] [facility] [address] [place], [place], [address] Phone: [phone]   Fax:  [phone]  Name: Akiva Josey MRN: [id number] Date of Birth: 1965/09/28

## 2019-08-12 ENCOUNTER — Ambulatory Visit: Payer: BC Managed Care – PPO

## 2019-08-16 ENCOUNTER — Ambulatory Visit: Payer: BC Managed Care – PPO

## 2019-08-16 ENCOUNTER — Other Ambulatory Visit: Payer: Self-pay

## 2019-08-16 DIAGNOSIS — R2689 Other abnormalities of gait and mobility: Secondary | ICD-10-CM

## 2019-08-16 DIAGNOSIS — R2681 Unsteadiness on feet: Secondary | ICD-10-CM

## 2019-08-16 DIAGNOSIS — M6281 Muscle weakness (generalized): Secondary | ICD-10-CM | POA: Diagnosis not present

## 2019-08-16 NOTE — Therapy (Signed)
[facility] [address] [place], [place], [address] Phone: [phone]   Fax:  [phone]  Physical Therapy Treatment  Patient Details  Name: Kimberly [name] MRN: [id number] Date of Birth: May 01, 1965 Referring Provider (PT): Edsel Petrin, DO   Encounter Date: 08/16/2019   PT End of Session - 08/16/19 1235    Visit Number 11    Number of Visits 13    Date for PT Re-Evaluation 10/07/19   POC for 6 weeks, Cert for 90 days   Authorization Type BCBS    PT Start Time 1235    PT Stop Time 1315    PT Time Calculation (min) 40 min    Equipment Utilized During Treatment Gait belt    Activity Tolerance Patient tolerated treatment well    Behavior During Therapy [facility] for tasks assessed/performed           Past Medical History:  Diagnosis Date  . Fibromyalgia   . IBS (irritable bowel syndrome)     History reviewed. No pertinent surgical history.  There were no vitals filed for this visit.   Subjective Assessment - 08/16/19 1237    Subjective Patient reports was feeling under the weather last week which is why she had to cancel visit.    Patient is accompained by: Family member   Husband   Pertinent History Fibromyalgia and IBS    Limitations Walking;Standing;Lifting    How long can you walk comfortably? 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 MRI brain negative for stroke    Patient Stated Goals Be able to go back to "normal", go back to work    Currently in Pain? Yes    Pain Score 3     Pain Location Leg    Pain Orientation Left    Pain Descriptors / Indicators Sore    Pain Type Acute pain                             [facility] Adult PT Treatment/Exercise - 08/16/19 1251      Transfers   Transfers Sit to Stand;Stand to Sit    Sit to Stand 5: Supervision    Stand to Sit 5: Supervision       Ambulation/Gait   Ambulation/Gait Yes    Ambulation/Gait Assistance 4: Min guard;5: Supervision    Ambulation/Gait Assistance Details completed gait training without AD.     Ambulation Distance (Feet) 345 Feet    Assistive device None    Gait Pattern Step-through pattern;Decreased arm swing - left;Decreased arm swing - right;Decreased step length - right;Decreased step length - left;Decreased stance time - left;Decreased hip/knee flexion - left;Decreased dorsiflexion - left;Decreased weight shift to left;Poor foot clearance - left    Ambulation Surface Level;Indoor      High Level Balance   High Level Balance Activities Negotiating over obstacles    High Level Balance Comments completed gait with negotiating over obstacles (inlcuding orange hurdles), x 5 laps down and back. with verbal cues to avoid circumduction. Pt demonstrating slowed gait speed upon stepping over obstacle.       Neuro Re-ed    Neuro Re-ed Details  Completed bending activity with reaching down for bean bags in basket on ground, 2 x 10 reps. Verbal cues for improved completion, including avoiding spinal flexion. Completed alternating between with using  RUE/LUE with completion.       Exercises   Exercises Other Exercises    Other Exercises  Completed lateral step ups to 6" step with BUE support from // bars. Completed x 10 reps leading with LLE for improved BLE strengthening.                     PT Short Term Goals - 08/02/19 1242      PT SHORT TERM GOAL #1   Title Patient will be independent with Initial HEP    Baseline Reports independence with initial HEP    Time 3    Period Weeks    Status Achieved    Target Date 07/30/19      PT SHORT TERM GOAL #2   Title Patient will demonstrate ability to ambulate >/= 350 ft with LRAD before require rest break to demo improved functional mobility and endurance    Baseline 75', 460' w/ SPC.    Time 3    Period Weeks    Status Achieved    Target Date  07/30/19      PT SHORT TERM GOAL #3   Title Patient will demonstrate ability to complete 5x sit <> stands in 30 seconds w/ UE support    Baseline 2 sit <> stands, 4 sit <> stands on 6/28    Time 3    Period Weeks    Status On-going    Target Date 07/30/19      PT SHORT TERM GOAL #4   Title Berg Balance and LTG to be set as appropriate    Baseline Assessed on 07/12/19    Time 3    Period Weeks    Status Achieved    Target Date 07/30/19             PT Long Term Goals - 07/12/19 1456      PT LONG TERM GOAL #1   Title Pt will be independent with Final HEP    Time 6    Period Weeks    Status New      PT LONG TERM GOAL #2   Title Patient will be able to ascend/descend 6 stairs Mod I with alternating pattern and single rail for improved ability to enter/exit home    Baseline 4, step to, single rail, Min Guard    Time 6    Period Weeks    Status New      PT LONG TERM GOAL #3   Title Patient will demo ability to complete 10 sit <> stands in 30 seconds with UE support to demo improved LE strength/endurance    Baseline 2 sit <> stands    Time 6    Period Weeks    Status New      PT LONG TERM GOAL #4   Title Patient will demonstrate ability to ambulate >/= 500 ft on indoor/outdoor surfaces with LRAD before require rest break to demo improved functional mobility and endurance    Baseline 75    Time 6    Period Weeks    Status New      PT LONG TERM GOAL #5   Title Patient will improved Berg Balance to >/= 48/56 points to demonstrate improvements in overall balance    Baseline 41/56    Time 6    Period Weeks    Status New                 Plan - 08/16/19 1423  Clinical Impression Statement Today's skilled PT session focused on continued balance and strengthenign activities targeting the LLE, progressing as tolerated by patient. Completed negotiating over obstacles today with CGA required intermittently. Continue to require rest breaks as needed due to  fatigue/shakiness of LLE. Patient will continue to benefit from skilled PT services to progress toward goals.    Personal Factors and Comorbidities Comorbidity 2    Comorbidities Fibromyalgia and IBS    Examination-Activity Limitations Bend;Squat;Stairs;Stand;Lift    Examination-Participation Restrictions Cleaning;Community Activity;Driving   Work   Stability/Clinical Decision Making Evolving/Moderate complexity    Rehab Potential Good    PT Frequency 2x / week    PT Duration 6 weeks    PT Treatment/Interventions ADLs/Self Care Home Management;Electrical Stimulation;Moist Heat;Cryotherapy;DME Instruction;Gait training;Stair training;Functional mobility training;Therapeutic activities;Therapeutic exercise;Balance training;Neuromuscular re-education;Patient/family education;Orthotic Fit/Training;Passive range of motion    PT Next Visit Plan RECERT + Check LTGs. Continue SciFit for strengthening/ednurance. Gait training w/o AD. LLE Strengthening. Balance Exercises. How was HEP Update?    Consulted and Agree with Plan of Care Patient;Family member/caregiver    Family Member Consulted Husband           Patient will benefit from skilled therapeutic intervention in order to improve the following deficits and impairments:  Abnormal gait, Decreased coordination, Difficulty walking, Decreased endurance, Decreased activity tolerance, Pain, Decreased balance, Decreased mobility, Decreased strength, Impaired sensation  Visit Diagnosis: Muscle weakness (generalized)  Other abnormalities of gait and mobility  Unsteadiness on feet     Problem List Patient Active Problem List   Diagnosis Date Noted  . 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 Near syncope 07/02/2019  . Passive smoke exposure 07/02/2019    Tempie Donning, PT, DPT 08/16/2019, 2:24 PM  [place] [facility] [address] [place], [place], [address] Phone: [phone]    Fax:  [phone]  Name: Kimberly [name] MRN: [id number] Date of Birth: [birth year]

## 2019-08-19 ENCOUNTER — Ambulatory Visit: Payer: BC Managed Care – PPO

## 2019-08-19 ENCOUNTER — Other Ambulatory Visit: Payer: Self-pay

## 2019-08-19 DIAGNOSIS — R2681 Unsteadiness on feet: Secondary | ICD-10-CM

## 2019-08-19 DIAGNOSIS — R2689 Other abnormalities of gait and mobility: Secondary | ICD-10-CM

## 2019-08-19 DIAGNOSIS — M6281 Muscle weakness (generalized): Secondary | ICD-10-CM | POA: Diagnosis not present

## 2019-08-19 NOTE — Therapy (Signed)
[facility] [address] [place], [place], [address] Phone: [phone]   Fax:  [phone]  Physical Therapy Treatment/ Re - Certification  Patient Details  Name: Shamecca Whitebread MRN: [id number] Date of Birth: October 11, 1965 Referring Provider (PT): Edsel Petrin, DO   Encounter Date: 08/19/2019   PT End of Session - 08/19/19 1148    Visit Number 12    Number of Visits 24    Date for PT Re-Evaluation 10/18/19   recert for 6 weeks, POC for 60 days   Authorization Type BCBS    PT Start Time 1145    PT Stop Time 1229    PT Time Calculation (min) 44 min    Equipment Utilized During Treatment Gait belt    Activity Tolerance Patient tolerated treatment well    Behavior During Therapy [facility] for tasks assessed/performed           Past Medical History:  Diagnosis Date  . Fibromyalgia   . IBS (irritable bowel syndrome)     History reviewed. No pertinent surgical history.  There were no vitals filed for this visit.   Subjective Assessment - 08/19/19 1148    Subjective Reports has had a busy morning, but feeling good this morning. Patient continues to report some soreness this morning.    Patient is accompained by: Family member   Husband   Pertinent History Fibromyalgia and IBS    Limitations Walking;Standing;Lifting    How long can you walk comfortably? 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 MRI brain negative for stroke    Patient Stated Goals Be able to go back to "normal", go back to work    Currently in Pain? Yes    Pain Score 3     Pain Location Arm    Pain Orientation Left    Pain Descriptors / Indicators Sore    Pain Type Acute pain                    [facility] PT Assessment - 08/19/19 0001      Assessment   Medical Diagnosis Vertebral Artery Dissection with Acute CVA    Referring Provider  (PT) Referred by: Edsel Petrin, DO. Followed by Ihor Austin, NP                    [facility] Adult PT Treatment/Exercise - 08/19/19 0001      Transfers   Transfers Sit to Stand;Stand to Sit    Sit to Stand 5: Supervision    Stand to Sit 5: Supervision    Comments 30 second chair stand test: 5 sit <> stand w/ UE support from mat at standard chair height.       Ambulation/Gait   Ambulation/Gait Yes    Ambulation/Gait Assistance 5: Supervision    Ambulation/Gait Assistance Details completed gait training on indoor/outdoor surfaces with SPC at supervision level. Patient able to demonstrate good stability with all surfaces.     Ambulation Distance (Feet) 600 Feet    Assistive device None    Gait Pattern Step-through pattern;Decreased arm swing - left;Decreased arm swing - right;Decreased step length - right;Decreased step length - left;Decreased stance time - left;Decreased hip/knee flexion - left;Decreased dorsiflexion - left;Decreased weight shift to left;Poor foot clearance - left    Ambulation Surface Level;Indoor;Unlevel;Outdoor    Stairs Yes    Stairs Assistance 4:  Min guard    Stairs Assistance Details (indicate cue type and reason) completed with CGA throughout for safety, increased shakiness noted with LLE.     Stair Management Technique One rail Right;Alternating pattern;Forwards    Number of Stairs 8    Height of Stairs 6      Standardized Balance Assessment   Standardized Balance Assessment Berg Balance Test      Berg Balance Test   Sit to Stand Able to stand  independently using hands    Standing Unsupported Able to stand safely 2 minutes    Sitting with Back Unsupported but Feet Supported on Floor or Stool Able to sit safely and securely 2 minutes    Stand to Sit Sits safely with minimal use of hands    Transfers Able to transfer safely, definite need of hands    Standing Unsupported with Eyes Closed Able to stand 10 seconds safely    Standing Ubsupported with  Feet Together Able to place feet together independently and stand 1 minute safely    From Standing, Reach Forward with Outstretched Arm Can reach forward >12 cm safely (5")    From Standing Position, Pick up Object from Floor Able to pick up shoe, needs supervision    From Standing Position, Turn to Look Behind Over each Shoulder Looks behind from both sides and weight shifts well    Turn 360 Degrees Able to turn 360 degrees safely but slowly    Standing Unsupported, Alternately Place Feet on Step/Stool Able to stand independently and complete 8 steps >20 seconds    Standing Unsupported, One Foot in Front Able to plae foot ahead of the other independently and hold 30 seconds    Standing on One Leg Able to lift leg independently and hold equal to or more than 3 seconds    Total Score 46                  PT Education - 08/19/19 1237    Education Details Educated on Progress toward LTG's    Person(s) Educated Patient;Spouse    Methods Explanation    Comprehension Verbalized understanding            PT Short Term Goals - 08/19/19 1246      PT SHORT TERM GOAL #1   Title Patient will be independent with Initial HEP    Baseline Reports independence with initial HEP    Time 3    Period Weeks    Status Achieved    Target Date 07/30/19      PT SHORT TERM GOAL #2   Title Patient will demonstrate ability to ambulate >/= 350 ft with LRAD before require rest break to demo improved functional mobility and endurance    Baseline 75', 460' w/ SPC.    Time 3    Period Weeks    Status Achieved    Target Date 07/30/19      PT SHORT TERM GOAL #3   Title Patient will demonstrate ability to complete 5x sit <> stands in 30 seconds w/ UE support    Baseline 2 sit <> stands, 4 sit <> stands on 6/28, 5 sit <> stands on 7/15    Time 3    Period Weeks    Status Achieved    Target Date 07/30/19      PT SHORT TERM GOAL #4   Title Berg Balance and LTG to be set as appropriate    Baseline  Assessed on 07/12/19  Time 3    Period Weeks    Status Achieved    Target Date 07/30/19             PT Long Term Goals - 08/19/19 1152      PT LONG TERM GOAL #1   Title Pt will be independent with Final HEP    Baseline PT continues to update HEP at this time    Time 6    Period Weeks    Status On-going      PT LONG TERM GOAL #2   Title Patient will be able to ascend/descend 6 stairs Mod I with alternating pattern and single rail for improved ability to enter/exit home    Baseline 8 stairs, alternating, single rail, min guard    Time 6    Period Weeks    Status Partially Met      PT LONG TERM GOAL #3   Title Patient will demo ability to complete 10 sit <> stands in 30 seconds with UE support to demo improved LE strength/endurance    Baseline 2 sit <> stands, 5 sit <> stands on 7/15    Time 6    Period Weeks    Status On-going      PT LONG TERM GOAL #4   Title Patient will demonstrate ability to ambulate >/= 500 ft on indoor/outdoor surfaces with LRAD before require rest break to demo improved functional mobility and endurance    Baseline 600 ft on outdoor/indoor surfaces with SPC    Time 6    Period Weeks    Status Achieved      PT LONG TERM GOAL #5   Title Patient will improved Berg Balance to >/= 48/56 points to demonstrate improvements in overall balance    Baseline 41/56, 46/56 on 7/15    Time 6    Period Weeks    Status On-going             Updated Short Term Goals:   PT Short Term Goals - 08/19/19 1246      PT SHORT TERM GOAL #1   Title Patient will demonstrate ability to complete 7 sit<> stands with 30 second chair stand to demo improved BLE strength    Baseline 5 sit <> stands    Time 3    Period Weeks    Status New    Target Date 09/09/19      PT SHORT TERM GOAL #2   Title Patient will improve Berg Balance Score to 48/56 to demo improved balance and reduced risk for falls    Baseline 46/56    Time 3    Period Weeks    Status New    Target  Date 09/09/19      PT SHORT TERM GOAL #3   Title --    Baseline --    Time --    Period --    Status --    Target Date --      PT SHORT TERM GOAL #4   Title --    Baseline --    Time --    Period --    Status --    Target Date --          Updated Long Term Goals:   PT Long Term Goals - 08/19/19 1152      PT LONG TERM GOAL #1   Title Pt will be independent with Final HEP (All LTG's Due: 09/30/2019)    Baseline PT continues to  update HEP at this time    Time 6    Period Weeks    Status On-going    Target Date 09/30/19      PT LONG TERM GOAL #2   Title Patient will be able to ascend/descend 12 stairs, Mod I with alternating pattern and single rail for improved ability to enter/exit home    Baseline 8 stairs, alternating, single rail, min guard    Time 6    Period Weeks    Status Revised      PT LONG TERM GOAL #3   Title Patient will demo ability to complete 10 sit <> stands in 30 seconds with UE support to demo improved LE strength/endurance    Baseline 2 sit <> stands, 5 sit <> stands on 7/15    Time 6    Period Weeks    Status On-going      PT LONG TERM GOAL #4   Title Patient will demo ability to ambulate >/= 1000 ft on various surfaces with LRAD to demo improved functional mobility and endurance    Baseline 600 ft on outdoor/indoor surfaces with SPC    Time 6    Period Weeks    Status Revised      PT LONG TERM GOAL #5   Title Patient will improved Berg Balance to >/= 50/56 points to demonstrate improvements in overall balance    Baseline 41/56, 46/56 on 7/15    Time 6    Period Weeks    Status Revised                Plan - 08/19/19 1259    Clinical Impression Statement Today's skilled PT session focused on assessment of patient's progress toward LTGs. Patient demo signficant progress with PT services, and has made progress toward all LTG at this time. Patient demonstrating improved functional mobility and improved balance with Berg Balance score of  46/56. Patient will continue to benefit from skilled PT services to progress toward goals.    Personal Factors and Comorbidities Comorbidity 2    Comorbidities Fibromyalgia and IBS    Examination-Activity Limitations Bend;Squat;Stairs;Stand;Lift    Examination-Participation Restrictions Cleaning;Community Activity;Driving   Work   Stability/Clinical Decision Making Evolving/Moderate complexity    Rehab Potential Good    PT Frequency 2x / week    PT Duration 6 weeks    PT Treatment/Interventions ADLs/Self Care Home Management;Electrical Stimulation;Moist Heat;Cryotherapy;DME Instruction;Gait training;Stair training;Functional mobility training;Therapeutic activities;Therapeutic exercise;Balance training;Neuromuscular re-education;Patient/family education;Orthotic Fit/Training;Passive range of motion    PT Next Visit Plan Continue SciFit for strengthening/ednurance. Gait training w/o AD. LLE Strengthening. Balance Exercises. How was HEP Update?    Consulted and Agree with Plan of Care Patient;Family member/caregiver    Family Member Consulted Husband           Patient will benefit from skilled therapeutic intervention in order to improve the following deficits and impairments:  Abnormal gait, Decreased coordination, Difficulty walking, Decreased endurance, Decreased activity tolerance, Pain, Decreased balance, Decreased mobility, Decreased strength, Impaired sensation  Visit Diagnosis: Muscle weakness (generalized)  Other abnormalities of gait and mobility  Unsteadiness on feet     Problem List Patient Active Problem List   Diagnosis Date Noted  . Vertebral artery dissection ([place]) 07/02/2019  . Near syncope 07/02/2019  . Passive smoke exposure 07/02/2019    Jones Bales, PT, DPT 08/19/2019, 1:54 PM  [facility] [address] [place], [place], [address] Phone: [phone]   Fax:  [phone]  Name: Willye Javier MRN: [id number] Date of Birth: 09/06/1965

## 2019-08-24 ENCOUNTER — Ambulatory Visit: Payer: BC Managed Care – PPO

## 2019-08-24 ENCOUNTER — Other Ambulatory Visit: Payer: Self-pay

## 2019-08-24 DIAGNOSIS — R2689 Other abnormalities of gait and mobility: Secondary | ICD-10-CM

## 2019-08-24 DIAGNOSIS — M6281 Muscle weakness (generalized): Secondary | ICD-10-CM

## 2019-08-24 DIAGNOSIS — R2681 Unsteadiness on feet: Secondary | ICD-10-CM

## 2019-08-24 NOTE — Therapy (Signed)
[facility] [address] [place], [place], [address] Phone: [phone]   Fax:  [phone]  Physical Therapy Treatment  Patient Details  Name: Kimberly [name] MRN: [id number] Date of Birth: November 24, 1965 Referring Provider (PT): Referred by: Edsel Petrin, DO. Followed by Ihor Austin, NP   Encounter Date: 08/24/2019   PT End of Session - 08/24/19 1146    Visit Number 13    Number of Visits 24    Date for PT Re-Evaluation 10/18/19   recert for 6 weeks, POC for 60 days   Authorization Type BCBS    PT Start Time 1145    PT Stop Time 1229    PT Time Calculation (min) 44 min    Equipment Utilized During Treatment Gait belt    Activity Tolerance Patient tolerated treatment well    Behavior During Therapy WFL for tasks assessed/performed           Past Medical History:  Diagnosis Date  . Fibromyalgia   . IBS (irritable bowel syndrome)     No past surgical history on file.  There were no vitals filed for this visit.   Subjective Assessment - 08/24/19 1147    Subjective Patient reports that she had some vertigo this mornin, lasted about 30 minutes. Reports she had to go back and lay down in the bed. Reports she has been achy due to the weather changes from her fibromyalgia.    Patient is accompained by: Family member   Husband   Pertinent History Fibromyalgia and IBS    Limitations Walking;Standing;Lifting    How long can you walk comfortably? 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 MRI brain negative for stroke    Patient Stated Goals Be able to go back to "normal", go back to work    Currently in Pain? No/denies                             [facility] Adult PT Treatment/Exercise - 08/24/19 0001      Transfers   Transfers Sit to Stand;Stand to Sit    Sit to Stand 5: Supervision    Stand  to Sit 5: Supervision    Comments completed 1 x 10 reps of sit <> stands with BLE placed on blue mat to furthe challenge balance. Patient able to complete with supv, increased time required for completion.       Ambulation/Gait   Ambulation/Gait Yes    Ambulation/Gait Assistance 5: Supervision    Ambulation/Gait Assistance Details complted gait training without AD. PT providing verbal encouragement for improved distance prior to rest break. PT providing verbal cues for improved heel toe pattern as scuffing feet with fatigue.     Ambulation Distance (Feet) 460 Feet    Assistive device None    Gait Pattern Step-through pattern;Decreased arm swing - left;Decreased arm swing - right;Decreased step length - right;Decreased step length - left;Decreased stance time - left;Decreased hip/knee flexion - left;Decreased dorsiflexion - left;Decreased weight shift to left;Poor foot clearance - left    Ambulation Surface Level;Indoor      Neuro Re-ed    Neuro Re-ed Details  Completed activites in tall kneeling with UE support from bench. Completed tall kneeling mini squats, 1 x 15 reps. In tall kneeling postioned, worked on transitioning to half kneeling position with LLE, 1  x10 reps.       Knee/Hip Exercises: Aerobic   Other Aerobic Completed SciFit on Level 2.0 Level x 7 minutes. Focus on improved BLE strength. Slowed pace, with focus on keeping RPM > 25.       Knee/Hip Exercises: Standing   Lateral Step Up Left;2 sets;Hand Hold: 2;Step Height: 6";5 reps    Lateral Step Up Limitations in // bars, rest break required after 5 reps. Verbal cues for improved foot placement and avoid compensation    Forward Step Up Left;2 sets;5 reps;Hand Hold: 2;Step Height: 6"    Forward Step Up Limitations in // bars, increased time required. rest break after 5 reps.                     PT Short Term Goals - 08/19/19 1246      PT SHORT TERM GOAL #1   Title Patient will demonstrate ability to complete 7 sit<>  stands with 30 second chair stand to demo improved BLE strength    Baseline 5 sit <> stands    Time 3    Period Weeks    Status New    Target Date 09/09/19      PT SHORT TERM GOAL #2   Title Patient will improve Berg Balance Score to 48/56 to demo improved balance and reduced risk for falls    Baseline 46/56    Time 3    Period Weeks    Status New    Target Date 09/09/19      PT SHORT TERM GOAL #3   Title --    Baseline --    Time --    Period --    Status --    Target Date --      PT SHORT TERM GOAL #4   Title --    Baseline --    Time --    Period --    Status --    Target Date --             PT Long Term Goals - 08/19/19 1152      PT LONG TERM GOAL #1   Title Pt will be independent with Final HEP (All LTG's Due: 09/30/2019)    Baseline PT continues to update HEP at this time    Time 6    Period Weeks    Status On-going    Target Date 09/30/19      PT LONG TERM GOAL #2   Title Patient will be able to ascend/descend 12 stairs, Mod I with alternating pattern and single rail for improved ability to enter/exit home    Baseline 8 stairs, alternating, single rail, min guard    Time 6    Period Weeks    Status Revised      PT LONG TERM GOAL #3   Title Patient will demo ability to complete 10 sit <> stands in 30 seconds with UE support to demo improved LE strength/endurance    Baseline 2 sit <> stands, 5 sit <> stands on 7/15    Time 6    Period Weeks    Status On-going      PT LONG TERM GOAL #4   Title Patient will demo ability to ambulate >/= 1000 ft on various surfaces with LRAD to demo improved functional mobility and endurance    Baseline 600 ft on outdoor/indoor surfaces with SPC    Time 6    Period Weeks    Status Revised  PT LONG TERM GOAL #5   Title Patient will improved Berg Balance to >/= 50/56 points to demonstrate improvements in overall balance    Baseline 41/56, 46/56 on 7/15    Time 6    Period Weeks    Status Revised                  Plan - 08/24/19 1220    Clinical Impression Statement Continued activites focused on improved BLE strengthening and neuromuscular control. Initiating activities in tall kneeling/half kneeling today with patient tolerating well. Continue to provide verbal encouragement for improved activity tolerance, as still require intermittent rest breaks due to fatigue.    Personal Factors and Comorbidities Comorbidity 2    Comorbidities Fibromyalgia and IBS    Examination-Activity Limitations Bend;Squat;Stairs;Stand;Lift    Examination-Participation Restrictions Cleaning;Community Activity;Driving   Work   Stability/Clinical Decision Making Evolving/Moderate complexity    Rehab Potential Good    PT Frequency 2x / week    PT Duration 6 weeks    PT Treatment/Interventions ADLs/Self Care Home Management;Electrical Stimulation;Moist Heat;Cryotherapy;DME Instruction;Gait training;Stair training;Functional mobility training;Therapeutic activities;Therapeutic exercise;Balance training;Neuromuscular re-education;Patient/family education;Orthotic Fit/Training;Passive range of motion    PT Next Visit Plan Continue SciFit for strengthening/ednurance. Gait training w/o AD. LLE Strengthening. Balance Exercises. How was HEP Update?    Consulted and Agree with Plan of Care Patient;Family member/caregiver    Family Member Consulted Husband           Patient will benefit from skilled therapeutic intervention in order to improve the following deficits and impairments:  Abnormal gait, Decreased coordination, Difficulty walking, Decreased endurance, Decreased activity tolerance, Pain, Decreased balance, Decreased mobility, Decreased strength, Impaired sensation  Visit Diagnosis: Muscle weakness (generalized)  Other abnormalities of gait and mobility  Unsteadiness on feet     Problem List Patient Active Problem List   Diagnosis Date Noted  . 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 Near  syncope 07/02/2019  . Passive smoke exposure 07/02/2019    Tempie Donning, PT, DPT 08/24/2019, 12:26 PM  [facility] [address] [place], [place], [address] Phone: [phone]   Fax:  [phone]  Name: Joletta Manner MRN: [id number] Date of Birth: February 04, 1966

## 2019-08-30 ENCOUNTER — Encounter: Payer: Self-pay | Admitting: Occupational Therapy

## 2019-08-30 ENCOUNTER — Other Ambulatory Visit: Payer: Self-pay

## 2019-08-30 ENCOUNTER — Telehealth: Payer: Self-pay

## 2019-08-30 ENCOUNTER — Ambulatory Visit: Payer: BC Managed Care – PPO | Admitting: Occupational Therapy

## 2019-08-30 DIAGNOSIS — R41842 Visuospatial deficit: Secondary | ICD-10-CM

## 2019-08-30 DIAGNOSIS — I69354 Hemiplegia and hemiparesis following cerebral infarction affecting left non-dominant side: Secondary | ICD-10-CM

## 2019-08-30 DIAGNOSIS — M6281 Muscle weakness (generalized): Secondary | ICD-10-CM

## 2019-08-30 DIAGNOSIS — R208 Other disturbances of skin sensation: Secondary | ICD-10-CM

## 2019-08-30 DIAGNOSIS — M25622 Stiffness of left elbow, not elsewhere classified: Secondary | ICD-10-CM

## 2019-08-30 DIAGNOSIS — R41840 Attention and concentration deficit: Secondary | ICD-10-CM

## 2019-08-30 DIAGNOSIS — I69954 Hemiplegia and hemiparesis following unspecified cerebrovascular disease affecting left non-dominant side: Secondary | ICD-10-CM

## 2019-08-30 NOTE — Therapy (Signed)
[facility] [address] [place], [place], [address] Phone: [phone]   Fax:  [phone]  Occupational Therapy Evaluation  Patient Details  Name: Kimberly [name] MRN: [id number] Date of Birth: [birth year] No data recorded  Encounter Date: 08/30/2019   OT End of Session - 08/30/19 1838    Visit Number 1    Number of Visits 17    Date for OT Re-Evaluation 11/13/19    Authorization Type BCBS - 60 visits combined PT/OT/ST    OT Start Time 1615    OT Stop Time 1705    OT Time Calculation (min) 50 min    Activity Tolerance Patient tolerated treatment well    Behavior During Therapy [facility] for tasks assessed/performed           Past Medical History:  Diagnosis Date  . Fibromyalgia   . IBS (irritable bowel syndrome)     History reviewed. No pertinent surgical history.  There were no vitals filed for this visit.   Subjective Assessment - 08/30/19 1624    Subjective  Wants to regain use of left hand, Like when I am cooking, pour off a pot.    Patient is accompanied by: Family member    Currently in Pain? No/denies    Pain Score 1     Pain Location Hand    Pain Orientation Left    Pain Descriptors / Indicators Tingling    Pain Type Acute pain             [facility] OT Assessment - 08/30/19 0001      Assessment   Medical Diagnosis Vertebral Artery Dissection    Onset Date/Surgical Date 07/01/19    Hand Dominance Right      Precautions   Precautions Fall    Precaution Comments Patient reports being told to complete body turns rather than turning neck.       Restrictions   Other Position/Activity Restrictions Restricted movement of LUE eg for vacuuming      Balance Screen   Has the patient fallen in the past 6 months No      Prior Function   Level of Independence Independent with basic ADLs;Independent with household mobility without device;Independent with community mobility without device;Independent with  homemaking with ambulation    Vocation Full time employment    Vocation Requirements Works at [employer]. Requires alot of lifting, climbing stairs, and preparing orders    Leisure foster cats, watching movies, cooking, gardening,       ADL   Eating/Feeding Modified independent    Grooming Modified independent    Upper Body Bathing Minimal assistance    Lower Body Bathing Increased time    Upper Body Dressing Minimal assistance    Lower Body Dressing Increased time    Toilet Transfer Modified independent    Toileting - Clothing Manipulation Increased time    Toileting -  Hygiene Increase time    Tub/Shower Transfer Minimal assistance    ADL comments Cannot tip head to shampoo hair, hand held showerhead, built in seat      IADL   Prior Level of Function Shopping Independent    Shopping Needs to be accompanied on any shopping trip    Prior Level of Function Light Housekeeping Independent    Light Housekeeping Performs light daily tasks but cannot maintain acceptable level of cleanliness    Prior Level of Function Meal Prep Independent    Meal Prep Needs to have meals prepared and served  Prior Level of Function Best boyCommunity Mobility Independent    Community Mobility Relies on family or friends for transportation      Written Expression   Dominant Hand Right      Vision - History   Baseline Vision Wears glasses all the time    Additional Comments Bifocals      Vision Assessment   Eye Alignment Within Functional Limits    Ocular Range of Motion Restricted on left    Alignment/Gaze Preference Within Defined Limits    Tracking/Visual Pursuits Impaired - to be further tested in functional context    Comment Consciously restrics head/eye movement left due to vertigo      Cognition   Overall Cognitive Status Impaired/Different from baseline    Area of Impairment Attention    Cognition Comments Reports decreased focus, decreased word finding, freezing mid thought -  needs further assessment      Observation/Other Assessments   Focus on Therapeutic Outcomes (FOTO)  NA      Posture/Postural Control   Posture/Postural Control No significant limitations      Sensation   Light Touch Impaired by gross assessment   diminished   Stereognosis Appears Intact    Proprioception Appears Intact      Coordination   Gross Motor Movements are Fluid and Coordinated No    Fine Motor Movements are Fluid and Coordinated No    9 Hole Peg Test Right;Left    Right 9 Hole Peg Test 25.03    Left 9 Hole Peg Test 37.53   orients board vertically     Perception   Perception Within Functional Limits      Praxis   Praxis Intact      Tone   Assessment Location Left Upper Extremity      ROM / Strength   AROM / PROM / Strength AROM;Strength      AROM   Overall AROM  Deficits    AROM Assessment Site Shoulder    Right/Left Shoulder Left    Left Shoulder Flexion 70 Degrees    Left Shoulder ABduction 65 Degrees      Strength   Overall Strength Deficits    Strength Assessment Site Shoulder;Elbow;Forearm    Right/Left Shoulder Left    Left Shoulder Flexion 3/5    Left Shoulder ABduction 3/5    Right/Left Elbow Left    Left Elbow Flexion 3+/5    Left Elbow Extension 3+/5    Right/Left Forearm Left    Left Forearm Pronation 3+/5    Left Forearm Supination 3+/5      Hand Function   Right Hand Gross Grasp Functional    Right Hand Grip (lbs) 54    Right Hand Lateral Pinch 20 lbs    Right Hand 3 Point Pinch 16 lbs    Left Hand Gross Grasp Impaired    Left Hand Lateral Pinch 7.9 lbs    Left 3 point pinch 5 lbs    Comment 5      LUE Tone   LUE Tone Within Functional Limits                           OT Education - 08/30/19 1838    Education Details Reviewed results of OT evaluation and potential OT goals    Person(s) Educated Patient;Spouse    Methods Explanation    Comprehension Verbalized understanding            OT Short Term  Goals - 08/30/19 1849      OT SHORT TERM GOAL #1   Title Patient will complete a home exercise program designed to improve shoulder range of motion due 10/14/19    Time 4    Period Weeks    Status New    Target Date 10/14/19      OT SHORT TERM GOAL #2   Title Patient will complete a home exercise program designed to improve hand strength    Time 4    Period Weeks    Status New      OT SHORT TERM GOAL #3   Title Patient will incorporate demonstrate adequate strength and range of motion in left arm to reach low to obtain and replace  lightweight object (less than 1 lb) off table top while standing.    Time 4    Period Weeks    Status New      OT SHORT TERM GOAL #4   Title Patient willa ctively attend to familiar functional or novel task x 15 minutes in distracting environment without any prompting    Time 4    Period Weeks    Status New      OT SHORT TERM GOAL #5   Title Patient will utilize BUE to  reach low and show sufficient grasp strength to grasp and pull up and fasten pants.    Time 4    Period Weeks    Status New             OT Long Term Goals - 08/30/19 1855      OT LONG TERM GOAL #1   Title Patient will complete an updated HEP which addresses sactive movement, strength and functional use of LUE    Time 8    Period Weeks    Status New    Target Date 11/13/19      OT LONG TERM GOAL #2   Title Patient will demonstrate mid level supported reach with LUE to obtain or put away lightweight dishes at chest height    Time 8    Period Weeks    Status New      OT LONG TERM GOAL #3   Title Patient will transport clothing to laundry and complete laundry with intermittent min assistance    Time 8    Period Weeks    Status New      OT LONG TERM GOAL #4   Title Patient will demonstrate at least 10 lb increase in grip strength to allow her to open bottles, carry items in LUE    Baseline 7.9 lbs    Time 8    Period Weeks    Status New                  Plan - 08/30/19 1840    Clinical Impression Statement Patient is a 54 year old woman who on 07/01/19 had vertebral artery dissection while at work.  She has resultant vertigo, left hemiplegia UE>LE (profound weakness), diminished sensation LUE, and she also reports decreased ability to susatin attention to task, all of which impede her ability to safely and effectively complete ADL's/IADL's and work.    OT Occupational Profile and History Detailed Assessment- Review of Records and additional review of physical, cognitive, psychosocial history related to current functional performance    Occupational performance deficits (Please refer to evaluation for details): ADL's;IADL's;Work;Leisure    Body Structure / Function / Physical Skills ADL;Coordination;Endurance;GMC;UE functional use;Balance;Decreased knowledge of precautions;Sensation;Vestibular;Vision;Pain;IADL;Body mechanics;Dexterity;FMC;Strength;ROM;Edema  Cognitive Skills Attention;Memory    Rehab Potential Fair    Clinical Decision Making Several treatment options, min-mod task modification necessary    Comorbidities Affecting Occupational Performance: May have comorbidities impacting occupational performance    Modification or Assistance to Complete Evaluation  Min-Moderate modification of tasks or assist with assess necessary to complete eval    OT Frequency 2x / week    OT Duration 8 weeks    OT Treatment/Interventions Self-care/ADL training;Therapeutic exercise;Visual/perceptual remediation/compensation;Patient/family education;Neuromuscular education;Building services engineer;Therapeutic activities;Balance training;Manual Therapy;DME and/or AE instruction;Aquatic Therapy;Splinting    Plan Review OT goals and begin HEP - Needs shoulder stability - supine if she can tolerate    Consulted and Agree with Plan of Care Patient           Patient will benefit from skilled therapeutic intervention in order to improve the following deficits  and impairments:   Body Structure / Function / Physical Skills: ADL, Coordination, Endurance, GMC, UE functional use, Balance, Decreased knowledge of precautions, Sensation, Vestibular, Vision, Pain, IADL, Body mechanics, Dexterity, FMC, Strength, ROM, Edema Cognitive Skills: Attention, Memory     Visit Diagnosis: 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 Near syncope 07/02/2019  . Passive smoke exposure 07/02/2019    Collier Salina, OTR/L 08/30/2019, 7:02 PM  [facility] [address] [place], [place], [address] Phone: [phone]   Fax:  [phone]  Name: Kimberly [name] MRN: [id number] Date of Birth: 1965/08/18

## 2019-08-30 NOTE — Telephone Encounter (Signed)
Kimberly Austin, NP, Kimberly [name] has been receiving PT services.  The patient would benefit from OT evaluation for Left UE weakness and coordination difficulties.   If you agree, please place an order in [facility] in [facility] or fax the order to [phone]. Thank you, Adelfa Koh, PT, DPT  [facility] [address] [place], [place]  [address] Phone:  [phone] Fax:  [phone]

## 2019-08-30 NOTE — Telephone Encounter (Signed)
Order placed for OT due to LUE weakness and coordination impairment as requested by Jethro Bastos, PT

## 2019-09-08 ENCOUNTER — Other Ambulatory Visit: Payer: Self-pay

## 2019-09-08 ENCOUNTER — Ambulatory Visit: Payer: BC Managed Care – PPO | Admitting: Occupational Therapy

## 2019-09-08 ENCOUNTER — Ambulatory Visit: Payer: BC Managed Care – PPO | Attending: Internal Medicine

## 2019-09-08 DIAGNOSIS — I69954 Hemiplegia and hemiparesis following unspecified cerebrovascular disease affecting left non-dominant side: Secondary | ICD-10-CM | POA: Diagnosis present

## 2019-09-08 DIAGNOSIS — R2689 Other abnormalities of gait and mobility: Secondary | ICD-10-CM | POA: Insufficient documentation

## 2019-09-08 DIAGNOSIS — M25622 Stiffness of left elbow, not elsewhere classified: Secondary | ICD-10-CM

## 2019-09-08 DIAGNOSIS — R2681 Unsteadiness on feet: Secondary | ICD-10-CM | POA: Insufficient documentation

## 2019-09-08 DIAGNOSIS — R41842 Visuospatial deficit: Secondary | ICD-10-CM | POA: Insufficient documentation

## 2019-09-08 DIAGNOSIS — R41840 Attention and concentration deficit: Secondary | ICD-10-CM | POA: Diagnosis present

## 2019-09-08 DIAGNOSIS — M6281 Muscle weakness (generalized): Secondary | ICD-10-CM

## 2019-09-08 NOTE — Patient Instructions (Signed)
Flexion (Assistive)    Clasp hands together and raise arms above head, keeping elbows as straight as possible. Do laying down Repeat __10__ times. Do __2__ sessions per day.   SHOULDER: Flexion - Supine (Cane)    Hold paper towel roll or shoe box (palms facing) in both hands. Raise arms up overhead. Do not allow back to arch. Hold _3__ seconds. _10__ reps per set, _2__ sets per day,  SHOULDER: Flexion Bilateral    Raise arms to eye level.  Keep elbows straight. _10__ reps per set, _2__ sets per day  Abduction (Eccentric) - Active (Cane)    Lift cane out to Lt side with affected arm (Lt palm up). Avoid hiking shoulder. Keep palm relaxed. Slowly lower affected arm for 3-5 seconds. _10__ reps per set, __2_ sets per day,   1. Grip Strengthening (Resistive Putty)   Squeeze putty using thumb and all fingers. Repeat _20___ times. Do __2__ sessions per day.   2. Roll putty into tube on table and pinch between thumb and first two fingers x 10 reps each. Do 2 sessions per day

## 2019-09-08 NOTE — Therapy (Signed)
[facility] [address] [place], [place], [address] Phone: [phone]   Fax:  [phone]  Occupational Therapy Treatment  Patient Details  Name: Kimberly [name] MRN: [id number] Date of Birth: 01-04-1966 No data recorded  Encounter Date: 09/08/2019   OT End of Session - 09/08/19 1115    Visit Number 2    Number of Visits 17    Date for OT Re-Evaluation 11/13/19    Authorization Type BCBS - 60 visits combined PT/OT/ST    OT Start Time 0930    OT Stop Time 1015    OT Time Calculation (min) 45 min    Activity Tolerance Patient tolerated treatment well    Behavior During Therapy [facility] for tasks assessed/performed;Anxious           Past Medical History:  Diagnosis Date  . Fibromyalgia   . IBS (irritable bowel syndrome)     No past surgical history on file.  There were no vitals filed for this visit.   Subjective Assessment - 09/08/19 0934    Patient is accompanied by: Family member    Pertinent History verterbral artery dissection 07/01/19    Limitations **no pushing/pulling or lifting, no looking up w/ cervical extension, no extreme head turns    Currently in Pain? Yes    Pain Score 5     Pain Location Hand    Pain Orientation Left    Pain Descriptors / Indicators Sore;Cramping    Pain Type Acute pain    Pain Onset In the past 7 days    Pain Frequency Intermittent    Aggravating Factors  trying to squeeze hand    Pain Relieving Factors rest           Discussed precautions with pt/husband following vertebral artery dissection - pt unsure of specific precautions from doctor, but also recommended no lifting >5 lbs until further clarification from MD. Also encouraged pt to use LUE more for mid level light weight reaching (putting away light/plastic dishes, light groceries, etc).  Pt issued shoulder HEP and putty HEP (yellow putty issued) - see pt instructions for details.  Pt performed each with encouragement.  Pt required gentle redirection at times t/o session as she tends to perseverate on deficits.                      OT Education - 09/08/19 1012    Education Details shoulder HEP, Putty HEP    Person(s) Educated Patient;Spouse    Methods Explanation;Demonstration;Handout    Comprehension Verbalized understanding;Returned demonstration            OT Short Term Goals - 09/08/19 1116      OT SHORT TERM GOAL #1   Title Patient will complete a home exercise program designed to improve shoulder range of motion due 10/14/19    Time 4    Period Weeks    Status Achieved    Target Date 10/14/19      OT SHORT TERM GOAL #2   Title Patient will complete a home exercise program designed to improve hand strength    Time 4    Period Weeks    Status Achieved      OT SHORT TERM GOAL #3   Title Patient will incorporate demonstrate adequate strength and range of motion in left arm to reach low to obtain and replace  lightweight object (less than 1 lb) off table top while standing.    Time 4  Period Weeks    Status New      OT SHORT TERM GOAL #4   Title Patient willa ctively attend to familiar functional or novel task x 15 minutes in distracting environment without any prompting    Time 4    Period Weeks    Status New      OT SHORT TERM GOAL #5   Title Patient will utilize BUE to  reach low and show sufficient grasp strength to grasp and pull up and fasten pants.    Time 4    Period Weeks    Status New             OT Long Term Goals - 08/30/19 1855      OT LONG TERM GOAL #1   Title Patient will complete an updated HEP which addresses sactive movement, strength and functional use of LUE    Time 8    Period Weeks    Status New    Target Date 11/13/19      OT LONG TERM GOAL #2   Title Patient will demonstrate mid level supported reach with LUE to obtain or put away lightweight dishes at chest height    Time 8    Period Weeks    Status New      OT LONG TERM  GOAL #3   Title Patient will transport clothing to laundry and complete laundry with intermittent min assistance    Time 8    Period Weeks    Status New      OT LONG TERM GOAL #4   Title Patient will demonstrate at least 10 lb increase in grip strength to allow her to open bottles, carry items in LUE    Baseline 7.9 lbs    Time 8    Period Weeks    Status New                 Plan - 09/08/19 1116    Clinical Impression Statement Pt has met STG'S #1 and #2. Pt tends to perserverate on deficits and needs encouragement and redirection at times.    OT Occupational Profile and History Detailed Assessment- Review of Records and additional review of physical, cognitive, psychosocial history related to current functional performance    Occupational performance deficits (Please refer to evaluation for details): ADL's;IADL's;Work;Leisure    Body Structure / Function / Physical Skills ADL;Coordination;Endurance;GMC;UE functional use;Balance;Decreased knowledge of precautions;Sensation;Vestibular;Vision;Pain;IADL;Body mechanics;Dexterity;FMC;Strength;ROM;Edema    Cognitive Skills Attention;Memory    Rehab Potential Fair    Clinical Decision Making Several treatment options, min-mod task modification necessary    Comorbidities Affecting Occupational Performance: May have comorbidities impacting occupational performance    Modification or Assistance to Complete Evaluation  Min-Moderate modification of tasks or assist with assess necessary to complete eval    OT Frequency 2x / week    OT Duration 8 weeks    OT Treatment/Interventions Self-care/ADL training;Therapeutic exercise;Visual/perceptual remediation/compensation;Patient/family education;Neuromuscular education;Therapist, nutritional;Therapeutic activities;Balance training;Manual Therapy;DME and/or AE instruction;Aquatic Therapy;Splinting    Plan Review OT goals, review HEP's prn, progress towards remaining STG'S    Consulted and  Agree with Plan of Care Patient           Patient will benefit from skilled therapeutic intervention in order to improve the following deficits and impairments:   Body Structure / Function / Physical Skills: ADL, Coordination, Endurance, GMC, UE functional use, Balance, Decreased knowledge of precautions, Sensation, Vestibular, Vision, Pain, IADL, Body mechanics, Dexterity, FMC, Strength, ROM, Edema Cognitive Skills:  Attention, Memory     Visit Diagnosis: Muscle weakness (generalized)  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 Vertebral artery dissection ([place]) 07/02/2019  . Near syncope 07/02/2019  . Passive smoke exposure 07/02/2019    Carey Bullocks, OTR/L 09/08/2019, 11:18 AM  [facility] [address] [place], [place], [address] Phone: [phone]   Fax:  [phone]  Name: Krysta Bloomfield MRN: [id number] Date of Birth: February 16, 1965

## 2019-09-08 NOTE — Therapy (Signed)
[facility] [address] [place], [place], [address] Phone: [phone]   Fax:  [phone]  Physical Therapy Treatment  Patient Details  Name: Kimberly [name] MRN: [id number] Date of Birth: 08/20/1965 Referring Provider (PT): Referred by: Edsel Petrin, DO. Followed by Ihor Austin, NP   Encounter Date: 09/08/2019   PT End of Session - 09/08/19 0854    Visit Number 14    Number of Visits 24    Date for PT Re-Evaluation 10/18/19   recert for 6 weeks, POC for 60 days   Authorization Type BCBS    PT Start Time [phone]    PT Stop Time 0929    PT Time Calculation (min) 43 min    Equipment Utilized During Treatment Gait belt    Activity Tolerance Patient tolerated treatment well    Behavior During Therapy WFL for tasks assessed/performed           Past Medical History:  Diagnosis Date   Fibromyalgia    IBS (irritable bowel syndrome)     No past surgical history on file.  There were no vitals filed for this visit.   Subjective Assessment - 09/08/19 0850    Subjective Patient reports her PCP put her out of work indefinitely. Patient still having some intermittent vertigo that she also saw PCP for. Reports been doing alot of things around the house.    Patient is accompained by: Family member   Husband   Pertinent History Fibromyalgia and IBS    Limitations Walking;Standing;Lifting    How long can you walk comfortably? 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 MRI brain negative for stroke    Patient Stated Goals Be able to go back to "normal", go back to work    Currently in Pain? No/denies                             [facility] Adult PT Treatment/Exercise - 09/08/19 0001      Transfers   Transfers Sit to Stand;Stand to Sit    Sit to Stand 5: Supervision    Stand to Sit 5: Supervision     Comments completed 1 x 10 reps with staggered stance (LLE positioned behind RLE). continue to demo increased shakiness with LLE with completion.       Ambulation/Gait   Ambulation/Gait Yes    Ambulation/Gait Assistance 5: Supervision    Ambulation/Gait Assistance Details completed gait training on indoor surfaces w/o AD x 460 feet, without AD patient demo decreased step length requiring verbal cues from PT for improved step length/cadence. Completed gait training on outdoor surfaces x 500 ft including pavement, grass, gravel. Patient demo being more cautious with gait on grass/gravel. All outdoor ambulation completed with SPC. PT educating on beginning to complete ambulation on unlevel surfaces including grass and gravel at home with [facility] and husband present. Patient only requiring supervision at this time on unlevel surfaces.     Ambulation Distance (Feet) 460 Feet   x 1, 500 x 1   Assistive device None    Gait Pattern Step-through pattern;Decreased arm swing - left;Decreased arm swing - right;Decreased step length - right;Decreased step length - left;Decreased stance time - left;Decreased hip/knee flexion - left;Decreased dorsiflexion - left;Decreased weight shift to left;Poor foot clearance - left    Ambulation Surface Level;Indoor;Outdoor;Paved;Gravel;Grass  Self-Care   Self-Care Other Self-Care Comments    Other Self-Care Comments  Patient requested PT fill out paperwork for employement/disability and providing information to employee regarding current abilities in regards to ability to complete job functions. Patient reporting that PCP has put patient out of work until further notice at this time.       Exercises   Exercises Other Exercises    Other Exercises  Completed lateral stepping without UE support, 4 x laps x 15'. With L lateral stepping, PT providing verbal cues for improved step length, as well as reduction in dragging RLE to further promote strengthening and weight shift onto  LLE. Standing on airex pad, completed mini squats 1 x 10 reps without UE support, intermittent CGA as needed. Patient overall able to maintain balance with completion, but demo shakiness/fatigue with increasing repetitions.                   PT Education - 09/08/19 1025    Education Details Educated on [employer] (See Ambulation Section in Treatment Note for Details)    Person(s) Educated Patient;Spouse    Methods Explanation    Comprehension Verbalized understanding            PT Short Term Goals - 08/19/19 1246      PT SHORT TERM GOAL #1   Title Patient will demonstrate ability to complete 7 sit<> stands with 30 second chair stand to demo improved BLE strength    Baseline 5 sit <> stands    Time 3    Period Weeks    Status New    Target Date 09/09/19      PT SHORT TERM GOAL #2   Title Patient will improve Berg Balance Score to 48/56 to demo improved balance and reduced risk for falls    Baseline 46/56    Time 3    Period Weeks    Status New    Target Date 09/09/19      PT SHORT TERM GOAL #3   Title --    Baseline --    Time --    Period --    Status --    Target Date --      PT SHORT TERM GOAL #4   Title --    Baseline --    Time --    Period --    Status --    Target Date --             PT Long Term Goals - 08/19/19 1152      PT LONG TERM GOAL #1   Title Pt will be independent with Final HEP (All LTG's Due: 09/30/2019)    Baseline PT continues to update HEP at this time    Time 6    Period Weeks    Status On-going    Target Date 09/30/19      PT LONG TERM GOAL #2   Title Patient will be able to ascend/descend 12 stairs, Mod I with alternating pattern and single rail for improved ability to enter/exit home    Baseline 8 stairs, alternating, single rail, min guard    Time 6    Period Weeks    Status Revised      PT LONG TERM GOAL #3   Title Patient will demo ability to complete 10 sit <> stands in 30 seconds with UE support to demo  improved LE strength/endurance    Baseline 2 sit <> stands, 5 sit <> stands  on 7/15    Time 6    Period Weeks    Status On-going      PT LONG TERM GOAL #4   Title Patient will demo ability to ambulate >/= 1000 ft on various surfaces with LRAD to demo improved functional mobility and endurance    Baseline 600 ft on outdoor/indoor surfaces with SPC    Time 6    Period Weeks    Status Revised      PT LONG TERM GOAL #5   Title Patient will improved Berg Balance to >/= 50/56 points to demonstrate improvements in overall balance    Baseline 41/56, 46/56 on 7/15    Time 6    Period Weeks    Status Revised                 Plan - 09/08/19 1035    Clinical Impression Statement Today's skilled PT session included gait training on indoor and outdoor surfaces, patient able to ambulate on outdoor surfaces with AD and supervision at this time. Continued activites promoting LLE strengthening and weight shift, still require intermittent rest breaks.    Personal Factors and Comorbidities Comorbidity 2    Comorbidities Fibromyalgia and IBS    Examination-Activity Limitations Bend;Squat;Stairs;Stand;Lift    Examination-Participation Restrictions Cleaning;Community Activity;Driving   Work   Stability/Clinical Decision Making Evolving/Moderate complexity    Rehab Potential Good    PT Frequency 2x / week    PT Duration 6 weeks    PT Treatment/Interventions ADLs/Self Care Home Management;Electrical Stimulation;Moist Heat;Cryotherapy;DME Instruction;Gait training;Stair training;Functional mobility training;Therapeutic activities;Therapeutic exercise;Balance training;Neuromuscular re-education;Patient/family education;Orthotic Fit/Training;Passive range of motion    PT Next Visit Plan Continue SciFit for strengthening/ednurance. Gait training w/o AD. LLE Strengthening. Balance Exercises. How was HEP Update?    Consulted and Agree with Plan of Care Patient;Family member/caregiver    Family Member  Consulted Husband           Patient will benefit from skilled therapeutic intervention in order to improve the following deficits and impairments:  Abnormal gait, Decreased coordination, Difficulty walking, Decreased endurance, Decreased activity tolerance, Pain, Decreased balance, Decreased mobility, Decreased strength, Impaired sensation  Visit Diagnosis: Muscle weakness (generalized)  Unsteadiness on feet  Other abnormalities of gait and mobility     Problem List Patient Active Problem List   Diagnosis Date Noted   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

## 2019-09-10 ENCOUNTER — Other Ambulatory Visit: Payer: Self-pay

## 2019-09-10 ENCOUNTER — Ambulatory Visit: Payer: BC Managed Care – PPO

## 2019-09-10 ENCOUNTER — Ambulatory Visit: Payer: BC Managed Care – PPO | Admitting: Occupational Therapy

## 2019-09-10 ENCOUNTER — Encounter: Payer: Self-pay | Admitting: Occupational Therapy

## 2019-09-10 DIAGNOSIS — I69954 Hemiplegia and hemiparesis following unspecified cerebrovascular disease affecting left non-dominant side: Secondary | ICD-10-CM

## 2019-09-10 DIAGNOSIS — R2681 Unsteadiness on feet: Secondary | ICD-10-CM

## 2019-09-10 DIAGNOSIS — R2689 Other abnormalities of gait and mobility: Secondary | ICD-10-CM

## 2019-09-10 DIAGNOSIS — M6281 Muscle weakness (generalized): Secondary | ICD-10-CM

## 2019-09-10 DIAGNOSIS — M25622 Stiffness of left elbow, not elsewhere classified: Secondary | ICD-10-CM

## 2019-09-10 DIAGNOSIS — R41840 Attention and concentration deficit: Secondary | ICD-10-CM

## 2019-09-10 NOTE — Therapy (Signed)
[facility] [address] [place], [place], [address] Phone: [phone]   Fax:  [phone]  Occupational Therapy Treatment  Patient Details  Name: Kimberly [name] MRN: [id number] Date of Birth: [birth year] No data recorded  Encounter Date: 09/10/2019   OT End of Session - 09/10/19 0859    Visit Number 3    Number of Visits 17    Date for OT Re-Evaluation 11/13/19    Authorization Type BCBS - 60 visits combined PT/OT/ST    OT Start Time 0850    OT Stop Time 0930    OT Time Calculation (min) 40 min    Activity Tolerance Patient tolerated treatment well    Behavior During Therapy [facility] for tasks assessed/performed;Anxious           Past Medical History:  Diagnosis Date  . Fibromyalgia   . IBS (irritable bowel syndrome)     History reviewed. No pertinent surgical history.  There were no vitals filed for this visit.              Treatment:Functional reaching seated and standing to place and remove graded clothespins from vertical antennae with LUE  For sustained pinch, min difficulty. Placing pegs in grooved pegboard for increased LUE functional use/ dexterity, then removing with in hand manipulation, min v.c to avoid shoulder hike. Griipper set at level 1 to pick up blocks for sustained grip, min difficulty, 1 rest break required           OT Education - 09/10/19 0919    Education Details shoulder HEP review, 10 reps each, min v.c for positioning    Person(s) Educated Patient;Spouse    Methods Explanation;Demonstration    Comprehension Verbalized understanding;Returned demonstration            OT Short Term Goals - 09/10/19 0855      OT SHORT TERM GOAL #1   Title Patient will complete a home exercise program designed to improve shoulder range of motion due 10/14/19    Time 4    Period Weeks    Status Achieved    Target Date 10/14/19      OT SHORT TERM GOAL #2   Title Patient will  complete a home exercise program designed to improve hand strength    Time 4    Period Weeks    Status Achieved      OT SHORT TERM GOAL #3   Title Patient will incorporate demonstrate adequate strength and range of motion in left arm to reach low to obtain and replace  lightweight object (less than 1 lb) off table top while standing.    Time 4    Period Weeks    Status New      OT SHORT TERM GOAL #4   Title Patient willa ctively attend to familiar functional or novel task x 15 minutes in distracting environment without any prompting    Time 4    Period Weeks    Status New      OT SHORT TERM GOAL #5   Title Patient will utilize BUE to  reach low and show sufficient grasp strength to grasp and pull up and fasten pants.    Time 4    Period Weeks    Status New             OT Long Term Goals - 08/30/19 1855      OT LONG TERM GOAL #1   Title Patient will complete an updated HEP  which addresses sactive movement, strength and functional use of LUE    Time 8    Period Weeks    Status New    Target Date 11/13/19      OT LONG TERM GOAL #2   Title Patient will demonstrate mid level supported reach with LUE to obtain or put away lightweight dishes at chest height    Time 8    Period Weeks    Status New      OT LONG TERM GOAL #3   Title Patient will transport clothing to laundry and complete laundry with intermittent min assistance    Time 8    Period Weeks    Status New      OT LONG TERM GOAL #4   Title Patient will demonstrate at least 10 lb increase in grip strength to allow her to open bottles, carry items in LUE    Baseline 7.9 lbs    Time 8    Period Weeks    Status New                 Plan - 09/10/19 5093    Clinical Impression Statement Pt is progressing towards goals. She demonstrates understanding of cane HEP.    OT Occupational Profile and History Detailed Assessment- Review of Records and additional review of physical, cognitive, psychosocial history  related to current functional performance    Occupational performance deficits (Please refer to evaluation for details): ADL's;IADL's;Work;Leisure    Body Structure / Function / Physical Skills ADL;Coordination;Endurance;GMC;UE functional use;Balance;Decreased knowledge of precautions;Sensation;Vestibular;Vision;Pain;IADL;Body mechanics;Dexterity;FMC;Strength;ROM;Edema    Cognitive Skills Attention;Memory    Rehab Potential Fair    Clinical Decision Making Several treatment options, min-mod task modification necessary    Comorbidities Affecting Occupational Performance: May have comorbidities impacting occupational performance    Modification or Assistance to Complete Evaluation  Min-Moderate modification of tasks or assist with assess necessary to complete eval    OT Frequency 2x / week    OT Duration 8 weeks    OT Treatment/Interventions Self-care/ADL training;Therapeutic exercise;Visual/perceptual remediation/compensation;Patient/family education;Neuromuscular education;Building services engineer;Therapeutic activities;Balance training;Manual Therapy;DME and/or AE instruction;Aquatic Therapy;Splinting    Plan review HEP's prn, progress towards remaining STG'S    Consulted and Agree with Plan of Care Patient           Patient will benefit from skilled therapeutic intervention in order to improve the following deficits and impairments:   Body Structure / Function / Physical Skills: ADL, Coordination, Endurance, GMC, UE functional use, Balance, Decreased knowledge of precautions, Sensation, Vestibular, Vision, Pain, IADL, Body mechanics, Dexterity, FMC, Strength, ROM, Edema Cognitive Skills: Attention, Memory     Visit Diagnosis: Muscle weakness (generalized)  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 Near syncope 07/02/2019  . Passive smoke exposure 07/02/2019    Kimberly [name] 09/10/2019, 9:20 AM  [facility] [address] [place], [place], [address] Phone: [phone]   Fax:  [phone]  Name: Rolonda Pontarelli MRN: [id number] Date of Birth: Apr 18, 1965

## 2019-09-10 NOTE — Therapy (Signed)
[facility] [address] [place], [place], [address] Phone: [phone]   Fax:  [phone]  Physical Therapy Treatment  Patient Details  Name: Kimberly [name] MRN: [id number] Date of Birth: 17-Apr-1965 Referring Provider (PT): Referred by: Edsel Petrin, DO. Followed by Ihor Austin, NP   Encounter Date: 09/10/2019   PT End of Session - 09/10/19 0937    Visit Number 15    Number of Visits 24    Date for PT Re-Evaluation 10/18/19   recert for 6 weeks, POC for 60 days   Authorization Type BCBS    PT Start Time [phone]    PT Stop Time 1015    PT Time Calculation (min) 44 min    Equipment Utilized During Treatment Gait belt    Activity Tolerance Patient tolerated treatment well    Behavior During Therapy WFL for tasks assessed/performed           Past Medical History:  Diagnosis Date  . Fibromyalgia   . IBS (irritable bowel syndrome)     No past surgical history on file.  There were no vitals filed for this visit.   Subjective Assessment - 09/10/19 0938    Subjective Has had some dizziness this morning, needed to take medication for the symptoms. Just finished with OT.    Patient is accompained by: Family member   Husband   Pertinent History Fibromyalgia and IBS    Limitations Walking;Standing;Lifting    How long can you walk comfortably? 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 MRI brain negative for stroke    Patient Stated Goals Be able to go back to "normal", go back to work    Currently in Pain? Yes    Pain Score 2     Pain Location Leg    Pain Orientation Left    Pain Descriptors / Indicators Sore    Pain Type Acute pain                             [facility] Adult PT Treatment/Exercise - 09/10/19 0001      Transfers   Transfers Sit to Stand;Stand to Sit    Sit to Stand 5:  Supervision    Stand to Sit 5: Supervision      Ambulation/Gait   Ambulation/Gait Yes    Ambulation/Gait Assistance 5: Supervision    Ambulation/Gait Assistance Details completed gait training without AD with PT providing superivison. Verbal cues for step length and improved heel toe pattern.     Ambulation Distance (Feet) 345 Feet    Assistive device None    Gait Pattern Step-through pattern;Decreased arm swing - left;Decreased arm swing - right;Decreased step length - right;Decreased step length - left;Decreased stance time - left;Decreased hip/knee flexion - left;Decreased dorsiflexion - left;Decreased weight shift to left;Poor foot clearance - left    Ambulation Surface Level;Indoor      Self-Care   Self-Care Other Self-Care Comments    Other Self-Care Comments  Due to PT unable to fax papers due to "busy signal", PT providing patient with copy of paperwork.       Neuro Re-ed    Neuro Re-ed Details  Standing unsupported completed alteranting forward toe taps to 4" step 1 x 10 reps on BLE, progressed to complete crossover with completion 1 x 10 reps. PT providing verbal  cues for weight shift onto LLE. Patient reporting feels like the knee is going to buckle at end of completion. Completed standing on blue balance beam, with focus on keeping eyes closed 2 x 30 seconds each.       Exercises   Exercises Other Exercises    Other Exercises  Completed sit <> with blue foam under BLE without UE support, complteed mini squat and then focus on controlled descent, completed x 10 reps. PT providing verbal cues to ensure not using UE to push off thighs with sit <> stand. Completed alternating marching and progressed to completed with yellow theraband, 1 x 10 reps. Also progressed to completing resisted ankle dorsiflexion, 1 x 10 reps with yellow theraband.              Access Code: [email or web address] URL: [email or web address] Date: 09/10/2019 Prepared by: Jethro BastosKaitlyn Moncerrath Berhe  Exercises Sit  to Stand with Hands on Knees - 1 x daily - 7 x weekly - 3 sets - 5 reps Supine Bridge - 1 x daily - 7 x weekly - 3 sets - 5 reps Clamshell - 1 x daily - 7 x weekly - 2 sets - 10 reps Supine Heel Slide - 1 x daily - 7 x weekly - 2 sets - 10 reps Tandem Stance - 1 x daily - 7 x weekly - 1 sets - 3 reps - 30 hold Romberg Stance with Eyes Closed - 1 x daily - 7 x weekly - 1 sets - 3 reps - 30 hold Standing on Foam Pad - 1 x daily - 7 x weekly - 1 sets - 3 reps - 30 hold Walking March with Countertop Support - 1 x daily - 7 x weekly - 3 sets - 10 reps Seated Gastroc Stretch with Strap - 1 x daily - 7 x weekly - 1 sets - 3 reps - 30 hold Supine Lower Trunk Rotation - 1 x daily - 7 x weekly - 1 sets - 10 reps - 30 seconds hold Modified Thomas Stretch - 1 x daily - 7 x weekly - 1 sets - 3 reps - 30 secs - 1 min hold Seated Hamstring Stretch - 1 x daily - 7 x weekly - 1 sets - 3 reps - 30-45 seconds hold Seated March with Resistance - 1 x daily - 7 x weekly - 2 sets - 10 reps Seated Ankle Dorsiflexion with Anchored Resistance - 1 x daily - 7 x weekly - 2 sets - 10 reps         PT Short Term Goals - 08/19/19 1246      PT SHORT TERM GOAL #1   Title Patient will demonstrate ability to complete 7 sit<> stands with 30 second chair stand to demo improved BLE strength    Baseline 5 sit <> stands    Time 3    Period Weeks    Status New    Target Date 09/09/19      PT SHORT TERM GOAL #2   Title Patient will improve Berg Balance Score to 48/56 to demo improved balance and reduced risk for falls    Baseline 46/56    Time 3    Period Weeks    Status New    Target Date 09/09/19      PT SHORT TERM GOAL #3   Title --    Baseline --    Time --    Period --    Status --    Target Date --  PT SHORT TERM GOAL #4   Title --    Baseline --    Time --    Period --    Status --    Target Date --             PT Long Term Goals - 08/19/19 1152      PT LONG TERM GOAL #1   Title Pt  will be independent with Final HEP (All LTG's Due: 09/30/2019)    Baseline PT continues to update HEP at this time    Time 6    Period Weeks    Status On-going    Target Date 09/30/19      PT LONG TERM GOAL #2   Title Patient will be able to ascend/descend 12 stairs, Mod I with alternating pattern and single rail for improved ability to enter/exit home    Baseline 8 stairs, alternating, single rail, min guard    Time 6    Period Weeks    Status Revised      PT LONG TERM GOAL #3   Title Patient will demo ability to complete 10 sit <> stands in 30 seconds with UE support to demo improved LE strength/endurance    Baseline 2 sit <> stands, 5 sit <> stands on 7/15    Time 6    Period Weeks    Status On-going      PT LONG TERM GOAL #4   Title Patient will demo ability to ambulate >/= 1000 ft on various surfaces with LRAD to demo improved functional mobility and endurance    Baseline 600 ft on outdoor/indoor surfaces with SPC    Time 6    Period Weeks    Status Revised      PT LONG TERM GOAL #5   Title Patient will improved Berg Balance to >/= 50/56 points to demonstrate improvements in overall balance    Baseline 41/56, 46/56 on 7/15    Time 6    Period Weeks    Status Revised                 Plan - 09/10/19 1224    Clinical Impression Statement Today's skilled session focused on continued BLE strengthening and gait training. Progressed HEP to include yellow theraband with completion of marching and ankle dorsiflexion. Patient will continue to benefit from skilled PT services.    Personal Factors and Comorbidities Comorbidity 2    Comorbidities Fibromyalgia and IBS    Examination-Activity Limitations Bend;Squat;Stairs;Stand;Lift    Examination-Participation Restrictions Cleaning;Community Activity;Driving   Work   Stability/Clinical Decision Making Evolving/Moderate complexity    Rehab Potential Good    PT Frequency 2x / week    PT Duration 6 weeks    PT  Treatment/Interventions ADLs/Self Care Home Management;Electrical Stimulation;Moist Heat;Cryotherapy;DME Instruction;Gait training;Stair training;Functional mobility training;Therapeutic activities;Therapeutic exercise;Balance training;Neuromuscular re-education;Patient/family education;Orthotic Fit/Training;Passive range of motion    PT Next Visit Plan Continue SciFit for strengthening/ednurance. Gait training w/o AD. LLE Strengthening. Balance Exercises. How was HEP Update?    Consulted and Agree with Plan of Care Patient;Family member/caregiver    Family Member Consulted Husband           Patient will benefit from skilled therapeutic intervention in order to improve the following deficits and impairments:  Abnormal gait, Decreased coordination, Difficulty walking, Decreased endurance, Decreased activity tolerance, Pain, Decreased balance, Decreased mobility, Decreased strength, Impaired sensation  Visit Diagnosis: Muscle weakness (generalized)  Unsteadiness on feet  Other abnormalities of gait and mobility  Hemiplegia  and hemiparesis following unspecified cerebrovascular disease affecting left non-dominant side [facility])     Problem List Patient Active Problem List   Diagnosis Date Noted  . 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 Near syncope 07/02/2019  . Passive smoke exposure 07/02/2019    Tempie Donning, PT, DPT 09/10/2019, 12:26 PM  [facility] [address] [place], [place], [address] Phone: [phone]   Fax:  [phone]  Name: Kimberly [name] MRN: [id number] Date of Birth: 01/12/1966

## 2019-09-10 NOTE — Patient Instructions (Signed)
Access Code: [email or web address] URL: [email or web address] Date: 09/10/2019 Prepared by: Jethro Bastos  Exercises Sit to Stand with Hands on Knees - 1 x daily - 7 x weekly - 3 sets - 5 reps Supine Bridge - 1 x daily - 7 x weekly - 3 sets - 5 reps Clamshell - 1 x daily - 7 x weekly - 2 sets - 10 reps Supine Heel Slide - 1 x daily - 7 x weekly - 2 sets - 10 reps Tandem Stance - 1 x daily - 7 x weekly - 1 sets - 3 reps - 30 hold Romberg Stance with Eyes Closed - 1 x daily - 7 x weekly - 1 sets - 3 reps - 30 hold Standing on Foam Pad - 1 x daily - 7 x weekly - 1 sets - 3 reps - 30 hold Walking March with Countertop Support - 1 x daily - 7 x weekly - 3 sets - 10 reps Seated Gastroc Stretch with Strap - 1 x daily - 7 x weekly - 1 sets - 3 reps - 30 hold Supine Lower Trunk Rotation - 1 x daily - 7 x weekly - 1 sets - 10 reps - 30 seconds hold Modified Thomas Stretch - 1 x daily - 7 x weekly - 1 sets - 3 reps - 30 secs - 1 min hold Seated Hamstring Stretch - 1 x daily - 7 x weekly - 1 sets - 3 reps - 30-45 seconds hold Seated March with Resistance - 1 x daily - 7 x weekly - 2 sets - 10 reps Seated Ankle Dorsiflexion with Anchored Resistance - 1 x daily - 7 x weekly - 2 sets - 10 reps

## 2019-09-13 ENCOUNTER — Other Ambulatory Visit: Payer: Self-pay

## 2019-09-13 ENCOUNTER — Ambulatory Visit: Payer: BC Managed Care – PPO

## 2019-09-13 DIAGNOSIS — R2681 Unsteadiness on feet: Secondary | ICD-10-CM

## 2019-09-13 DIAGNOSIS — I69954 Hemiplegia and hemiparesis following unspecified cerebrovascular disease affecting left non-dominant side: Secondary | ICD-10-CM

## 2019-09-13 DIAGNOSIS — M6281 Muscle weakness (generalized): Secondary | ICD-10-CM | POA: Diagnosis not present

## 2019-09-13 DIAGNOSIS — R2689 Other abnormalities of gait and mobility: Secondary | ICD-10-CM

## 2019-09-13 NOTE — Therapy (Signed)
[facility] [address] [place], [place], [address] Phone: [phone]   Fax:  [phone]  Physical Therapy Treatment  Patient Details  Name: Kimberly [name] MRN: [id number] Date of Birth: Jan 12, 1966 Referring Provider (PT): Referred by: Cristal Ford, DO. Followed by Frann Rider, NP   Encounter Date: 09/13/2019   PT End of Session - 09/13/19 1235    Visit Number 16    Number of Visits 24    Date for PT Re-Evaluation 03/54/65   recert for 6 weeks, POC for 60 days   Authorization Type BCBS    PT Start Time 1233    PT Stop Time 1314    PT Time Calculation (min) 41 min    Equipment Utilized During Treatment Gait belt    Activity Tolerance Patient tolerated treatment well    Behavior During Therapy WFL for tasks assessed/performed           Past Medical History:  Diagnosis Date  . Fibromyalgia   . IBS (irritable bowel syndrome)     No past surgical history on file.  There were no vitals filed for this visit.   Subjective Assessment - 09/13/19 1236    Subjective Patient reports got papers sent off. No new complaints/concerns. Patient reports walked across grass, reports felt pretty well with it.    Patient is accompained by: Family member   Husband   Pertinent History Fibromyalgia and IBS    Limitations Walking;Standing;Lifting    How long can you walk comfortably? 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 MRI brain negative for stroke    Patient Stated Goals Be able to go back to "normal", go back to work    Currently in Pain? Yes    Pain Score 2     Pain Location Leg    Pain Orientation Left    Pain Descriptors / Indicators Sore    Pain Type Acute pain                             [facility] Adult PT Treatment/Exercise - 09/13/19 0001      Transfers   Transfers Sit to  Stand;Stand to Sit    Sit to Stand 5: Supervision    Stand to Sit 5: Supervision    Comments Completed 30 second chair stand test: completed 6 reps with UE support from mat at standard chair height      Ambulation/Gait   Ambulation/Gait Yes    Ambulation/Gait Assistance 5: Supervision    Ambulation/Gait Assistance Details throughtout therapy gym with activities    Assistive device None    Gait Pattern Step-through pattern;Decreased arm swing - left;Decreased arm swing - right;Decreased step length - right;Decreased step length - left;Decreased stance time - left;Decreased hip/knee flexion - left;Decreased dorsiflexion - left;Decreased weight shift to left;Poor foot clearance - left    Ambulation Surface Level;Indoor      Standardized Balance Assessment   Standardized Balance Assessment Berg Balance Test      Berg Balance Test   Sit to Stand Able to stand  independently using hands    Standing Unsupported Able to stand safely 2 minutes    Sitting with Back Unsupported but Feet Supported on Floor or Stool Able to sit safely and securely 2 minutes    Stand to Sit Sits safely with minimal  use of hands    Transfers Able to transfer safely, minor use of hands    Standing Unsupported with Eyes Closed Able to stand 10 seconds safely    Standing Ubsupported with Feet Together Able to place feet together independently and stand 1 minute safely    From Standing, Reach Forward with Outstretched Arm Can reach confidently >25 cm (10")    From Standing Position, Pick up Object from Floor Able to pick up shoe, needs supervision    From Standing Position, Turn to Look Behind Over each Shoulder Looks behind from both sides and weight shifts well    Turn 360 Degrees Able to turn 360 degrees safely but slowly    Standing Unsupported, Alternately Place Feet on Step/Stool Able to stand independently and safely and complete 8 steps in 20 seconds    Standing Unsupported, One Foot in Front Able to plae foot  ahead of the other independently and hold 30 seconds    Standing on One Leg Able to lift leg independently and hold equal to or more than 3 seconds    Total Score 49      Neuro Re-ed    Neuro Re-ed Details  In // bars: completed static standing across red balance beam with focus on maintaining steady, 2 x 1 minute each with eyes open.       Exercises   Exercises Knee/Hip      Knee/Hip Exercises: Standing   Lateral Step Up Left;1 set;10 reps;Hand Hold: 2;Step Height: 6";Limitations    Lateral Step Up Limitations completed in // bars with BUE support.    Forward Step Up Left;1 set;10 reps;Hand Hold: 2;Step Height: 6"    Forward Step Up Limitations completed in // bars with BUE support. Increased time required some fatigue noted with completion. i                  PT Education - 09/13/19 1247    Education Details Educated on progress toward goals    Person(s) Educated Patient;Spouse    Methods Explanation    Comprehension Verbalized understanding            PT Short Term Goals - 09/13/19 1240      PT SHORT TERM GOAL #1   Title Patient will demonstrate ability to complete 7 sit<> stands with 30 second chair stand to demo improved BLE strength    Baseline 5 sit <> stands, 6 sit <> stands w/ UE support    Time 3    Period Weeks    Status Not Met    Target Date 09/09/19      PT SHORT TERM GOAL #2   Title Patient will improve Berg Balance Score to 48/56 to demo improved balance and reduced risk for falls    Baseline 46/56, 49/56 on 09/13/19    Time 3    Period Weeks    Status Achieved    Target Date 09/09/19             PT Long Term Goals - 08/19/19 1152      PT LONG TERM GOAL #1   Title Pt will be independent with Final HEP (All LTG's Due: 09/30/2019)    Baseline PT continues to update HEP at this time    Time 6    Period Weeks    Status On-going    Target Date 09/30/19      PT LONG TERM GOAL #2   Title Patient will be able to ascend/descend 12  stairs, Mod  I with alternating pattern and single rail for improved ability to enter/exit home    Baseline 8 stairs, alternating, single rail, min guard    Time 6    Period Weeks    Status Revised      PT LONG TERM GOAL #3   Title Patient will demo ability to complete 10 sit <> stands in 30 seconds with UE support to demo improved LE strength/endurance    Baseline 2 sit <> stands, 5 sit <> stands on 7/15    Time 6    Period Weeks    Status On-going      PT LONG TERM GOAL #4   Title Patient will demo ability to ambulate >/= 1000 ft on various surfaces with LRAD to demo improved functional mobility and endurance    Baseline 600 ft on outdoor/indoor surfaces with SPC    Time 6    Period Weeks    Status Revised      PT LONG TERM GOAL #5   Title Patient will improved Berg Balance to >/= 50/56 points to demonstrate improvements in overall balance    Baseline 41/56, 46/56 on 7/15    Time 6    Period Weeks    Status Revised                 Plan - 09/13/19 1246    Clinical Impression Statement Today's skilled PT sesssion included assessment of patient's progress toward all STG. Patient able to meet Berg Balance goal with score of 49/56 demonstrating improved balance. Patient able to complete 6 sit <> stands with 30 second chair stand test today. Patient continues to demo progress with PT services and will continue to benefit from skilled PT services to progess toward all goals.    Personal Factors and Comorbidities Comorbidity 2    Comorbidities Fibromyalgia and IBS    Examination-Activity Limitations Bend;Squat;Stairs;Stand;Lift    Examination-Participation Restrictions Cleaning;Community Activity;Driving   Work   Stability/Clinical Decision Making Evolving/Moderate complexity    Rehab Potential Good    PT Frequency 2x / week    PT Duration 6 weeks    PT Treatment/Interventions ADLs/Self Care Home Management;Electrical Stimulation;Moist Heat;Cryotherapy;DME Instruction;Gait training;Stair  training;Functional mobility training;Therapeutic activities;Therapeutic exercise;Balance training;Neuromuscular re-education;Patient/family education;Orthotic Fit/Training;Passive range of motion    PT Next Visit Plan Potentially try Toe Up Brace? Continue SciFit for strengthening/ednurance. Gait training w/o AD. LLE Strengthening. Balance Exercises. How was HEP Update?    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 Near syncope 07/02/2019  . Passive smoke exposure 07/02/2019    Jones Bales, PT, DPT 09/13/2019, 1:16 PM  [facility] [address] [place], [place], [address] Phone: [phone]   Fax:  [phone]  Name: Markea Ruzich MRN: [id number] Date of Birth: Apr 03, 1965

## 2019-09-20 ENCOUNTER — Ambulatory Visit: Payer: BC Managed Care – PPO

## 2019-09-20 ENCOUNTER — Encounter: Payer: Self-pay | Admitting: Occupational Therapy

## 2019-09-20 ENCOUNTER — Other Ambulatory Visit: Payer: Self-pay

## 2019-09-20 ENCOUNTER — Ambulatory Visit: Payer: BC Managed Care – PPO | Admitting: Occupational Therapy

## 2019-09-20 DIAGNOSIS — M6281 Muscle weakness (generalized): Secondary | ICD-10-CM

## 2019-09-20 DIAGNOSIS — R41842 Visuospatial deficit: Secondary | ICD-10-CM

## 2019-09-20 DIAGNOSIS — M25622 Stiffness of left elbow, not elsewhere classified: Secondary | ICD-10-CM

## 2019-09-20 DIAGNOSIS — R41840 Attention and concentration deficit: Secondary | ICD-10-CM

## 2019-09-20 DIAGNOSIS — R2689 Other abnormalities of gait and mobility: Secondary | ICD-10-CM

## 2019-09-20 DIAGNOSIS — I69954 Hemiplegia and hemiparesis following unspecified cerebrovascular disease affecting left non-dominant side: Secondary | ICD-10-CM

## 2019-09-20 DIAGNOSIS — R2681 Unsteadiness on feet: Secondary | ICD-10-CM

## 2019-09-20 NOTE — Therapy (Signed)
[facility] [address] [place], [place], [address] Phone: [phone]   Fax:  [phone]  Physical Therapy Treatment  Patient Details  Name: Kimberly [name] MRN: [id number] Date of Birth: 10-26-1965 Referring Provider (PT): Referred by: Cristal Ford, DO. Followed by Frann Rider, NP   Encounter Date: 09/20/2019   PT End of Session - 09/20/19 1231    Visit Number 17    Number of Visits 24    Date for PT Re-Evaluation 97/53/00   recert for 6 weeks, POC for 60 days   Authorization Type BCBS    PT Start Time 1146    PT Stop Time 1230    PT Time Calculation (min) 44 min    Equipment Utilized During Treatment Gait belt    Activity Tolerance Patient tolerated treatment well    Behavior During Therapy WFL for tasks assessed/performed           Past Medical History:  Diagnosis Date  . Fibromyalgia   . IBS (irritable bowel syndrome)     No past surgical history on file.  There were no vitals filed for this visit.   Subjective Assessment - 09/20/19 1151    Subjective Patient reports that she was mopping in house and believes she over did it with the left leg. Patient reports it is feeling better, but took several days to get over.    Patient is accompained by: Family member   Husband   Pertinent History Fibromyalgia and IBS    Limitations Walking;Standing;Lifting    How long can you walk comfortably? 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 MRI brain negative for stroke    Patient Stated Goals Be able to go back to "normal", go back to work    Currently in Pain? Yes    Pain Score 4     Pain Location Leg    Pain Orientation Left    Pain Descriptors / Indicators Throbbing;Aching    Pain Type Chronic pain                             [facility] Adult PT Treatment/Exercise - 09/20/19 0001        Transfers   Transfers Sit to Stand;Stand to Sit    Sit to Stand 5: Supervision    Stand to Sit 5: Supervision      Ambulation/Gait   Ambulation/Gait Yes    Ambulation/Gait Assistance 5: Supervision;4: Min guard    Ambulation/Gait Assistance Details patient demo very narrow BOS with ambulation today almost demo scissoring gait pattern. PT educating on walking with wider BOS to help with ambulation and improved balance as patient is catching feet over one another. PT educating on potential gait change due to pain patient is having in L hip adductors and ambulating this way as a potenial compensation to avoid pain. PT providing manual faciliation for improved step length and cadence.     Ambulation Distance (Feet) 400 Feet    Assistive device None    Gait Pattern Step-through pattern;Decreased arm swing - left;Decreased arm swing - right;Decreased step length - right;Decreased step length - left;Decreased stance time - left;Decreased hip/knee flexion - left;Decreased dorsiflexion - left;Decreased weight shift to left;Poor foot clearance - left    Ambulation Surface Level;Indoor      Self-Care   Self-Care Other Self-Care  Comments    Other Self-Care Comments  Educated patient on activity modification due to patient reports that she over did over the last week with house work. Educating on completign 10-15 minutes then followed by rest break to avoid pain/fatigue.       Therapeutic Activites    Therapeutic Activities Other Therapeutic Activities    Other Therapeutic Activities Due to patient reports difficulty getting up from floor with activites. completed training on ability to get onto and off floor: with red mat on floor and using mat table as support, PT educating on bringing RLE forward for support and using BUE to push from mat table to standing. Patient able to demonstrate appropriately and completed x 5 reps.       Neuro Re-ed    Neuro Re-ed Details  In quadruped positoin: completed  hip flexion on R/L extermity with focus on maintaining pelvis steady with completion.       Exercises   Exercises Other Exercises    Other Exercises  Completed B hip adductor stretch 2 x 1 min each on bilateral lower extremity. PT providing over pressure into stretch as tolerated by patient.                   PT Education - 09/20/19 1303    Education Details Activity Modification (see self care section)    Person(s) Educated Patient;Spouse    Methods Explanation    Comprehension Verbalized understanding            PT Short Term Goals - 09/13/19 1240      PT SHORT TERM GOAL #1   Title Patient will demonstrate ability to complete 7 sit<> stands with 30 second chair stand to demo improved BLE strength    Baseline 5 sit <> stands, 6 sit <> stands w/ UE support    Time 3    Period Weeks    Status Not Met    Target Date 09/09/19      PT SHORT TERM GOAL #2   Title Patient will improve Berg Balance Score to 48/56 to demo improved balance and reduced risk for falls    Baseline 46/56, 49/56 on 09/13/19    Time 3    Period Weeks    Status Achieved    Target Date 09/09/19             PT Long Term Goals - 08/19/19 1152      PT LONG TERM GOAL #1   Title Pt will be independent with Final HEP (All LTG's Due: 09/30/2019)    Baseline PT continues to update HEP at this time    Time 6    Period Weeks    Status On-going    Target Date 09/30/19      PT LONG TERM GOAL #2   Title Patient will be able to ascend/descend 12 stairs, Mod I with alternating pattern and single rail for improved ability to enter/exit home    Baseline 8 stairs, alternating, single rail, min guard    Time 6    Period Weeks    Status Revised      PT LONG TERM GOAL #3   Title Patient will demo ability to complete 10 sit <> stands in 30 seconds with UE support to demo improved LE strength/endurance    Baseline 2 sit <> stands, 5 sit <> stands on 7/15    Time 6    Period Weeks    Status On-going  PT LONG TERM GOAL #4   Title Patient will demo ability to ambulate >/= 1000 ft on various surfaces with LRAD to demo improved functional mobility and endurance    Baseline 600 ft on outdoor/indoor surfaces with SPC    Time 6    Period Weeks    Status Revised      PT LONG TERM GOAL #5   Title Patient will improved Berg Balance to >/= 50/56 points to demonstrate improvements in overall balance    Baseline 41/56, 46/56 on 7/15    Time 6    Period Weeks    Status Revised                 Plan - 09/20/19 1303    Clinical Impression Statement Today's skilled session included training on how to safely get on/off the floor for improved ability for patient to do activites in the home. PT also educating on activity modification to avoid excessive fatigue/pain in LLE. Patient wil continue to benefit from skilled PT services to progress toward all goals.    Personal Factors and Comorbidities Comorbidity 2    Comorbidities Fibromyalgia and IBS    Examination-Activity Limitations Bend;Squat;Stairs;Stand;Lift    Examination-Participation Restrictions Cleaning;Community Activity;Driving   Work   Stability/Clinical Decision Making Evolving/Moderate complexity    Rehab Potential Good    PT Frequency 2x / week    PT Duration 6 weeks    PT Treatment/Interventions ADLs/Self Care Home Management;Electrical Stimulation;Moist Heat;Cryotherapy;DME Instruction;Gait training;Stair training;Functional mobility training;Therapeutic activities;Therapeutic exercise;Balance training;Neuromuscular re-education;Patient/family education;Orthotic Fit/Training;Passive range of motion    PT Next Visit Plan Potentially try Toe Up Brace? Continue SciFit for strengthening/ednurance. Gait training w/o AD. LLE Strengthening. Balance Exercises. How was HEP Update?    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 Near syncope 07/02/2019  . Passive smoke exposure 07/02/2019    Jones Bales, PT, DPT 09/20/2019, 1:06 PM  [facility] [address] [place], [place], [address] Phone: [phone]   Fax:  [phone]  Name: Pallas Wahlert MRN: [id number] Date of Birth: 10-31-1965

## 2019-09-20 NOTE — Therapy (Signed)
[facility] [address] [place], [place], [address] Phone: [phone]   Fax:  [phone]  Occupational Therapy Treatment  Patient Details  Name: Kimberly [name] MRN: [id number] Date of Birth: 10-03-1965 No data recorded  Encounter Date: 09/20/2019   OT End of Session - 09/20/19 1112    Visit Number 4    Number of Visits 17    Date for OT Re-Evaluation 11/13/19    Authorization Type BCBS - 60 visits combined PT/OT/ST    OT Start Time 1109   arrived late   OT Stop Time 1147    OT Time Calculation (min) 38 min    Activity Tolerance Patient tolerated treatment well    Behavior During Therapy [facility] for tasks assessed/performed;Anxious           Past Medical History:  Diagnosis Date  . Fibromyalgia   . IBS (irritable bowel syndrome)     History reviewed. No pertinent surgical history.  There were no vitals filed for this visit.   Subjective Assessment - 09/20/19 1111    Subjective  Pt reports pain due to Fibromyalgia.  Pt reports that she can pull up pants/fasten button using LUE to assist but is still difficulty, but using RUE to assist.  Pt reports that she is doing more at home, but reports that she thinks she did too much.    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 Yes    Pain Score 5     Pain Location Generalized    Pain Descriptors / Indicators Sore    Pain Type Chronic pain    Pain Onset In the past 7 days    Pain Frequency Intermittent    Aggravating Factors  unknown    Pain Relieving Factors unknown              Reviewed putty HEP for grip and pinch.  Placing small pegs in pegboard to copy design with good accuracy (for attention in busy environment and with conversation).   Removing using in-hand coordination  with good performance, no drops.  Sitting, cane ex for shoulder abduction and closed-chain shoulder flex with BUEs with ball  With min v.c. for compensation.  Table slides for shoulder flex and abduction for stretch/pain with min v.c.  Functional reaching to place washers on vertical poles (mid-range reach) for incr activity tolerance and ROM.    Standing, UE ranger for shoulder flex closed-chain AROM.        OT Short Term Goals - 09/10/19 0855      OT SHORT TERM GOAL #1   Title Patient will complete a home exercise program designed to improve shoulder range of motion due 10/14/19    Time 4    Period Weeks    Status Achieved    Target Date 10/14/19      OT SHORT TERM GOAL #2   Title Patient will complete a home exercise program designed to improve hand strength    Time 4    Period Weeks    Status Achieved      OT SHORT TERM GOAL #3   Title Patient will incorporate demonstrate adequate strength and range of motion in left arm to reach low to obtain and replace  lightweight object (less than 1 lb) off table top while standing.  Time 4    Period Weeks    Status New      OT SHORT TERM GOAL #4   Title Patient willa ctively attend to familiar functional or novel task x 15 minutes in distracting environment without any prompting    Time 4    Period Weeks    Status New      OT SHORT TERM GOAL #5   Title Patient will utilize BUE to  reach low and show sufficient grasp strength to grasp and pull up and fasten pants.    Time 4    Period Weeks    Status New             OT Long Term Goals - 08/30/19 1855      OT LONG TERM GOAL #1   Title Patient will complete an updated HEP which addresses sactive movement, strength and functional use of LUE    Time 8    Period Weeks    Status New    Target Date 11/13/19      OT LONG TERM GOAL #2   Title Patient will demonstrate mid level supported reach with LUE to obtain or put away lightweight dishes at chest height    Time 8     Period Weeks    Status New      OT LONG TERM GOAL #3   Title Patient will transport clothing to laundry and complete laundry with intermittent min assistance    Time 8    Period Weeks    Status New      OT LONG TERM GOAL #4   Title Patient will demonstrate at least 10 lb increase in grip strength to allow her to open bottles, carry items in LUE    Baseline 7.9 lbs    Time 8    Period Weeks    Status New                 Plan - 09/20/19 1112    Clinical Impression Statement Pt is progressing towards goals.  Pt reports incr participation with IADLs at home, however, reports soreness today due to fibromyalgia.    OT Occupational Profile and History Detailed Assessment- Review of Records and additional review of physical, cognitive, psychosocial history related to current functional performance    Occupational performance deficits (Please refer to evaluation for details): ADL's;IADL's;Work;Leisure    Body Structure / Function / Physical Skills ADL;Coordination;Endurance;GMC;UE functional use;Balance;Decreased knowledge of precautions;Sensation;Vestibular;Vision;Pain;IADL;Body mechanics;Dexterity;FMC;Strength;ROM;Edema    Cognitive Skills Attention;Memory    Rehab Potential Fair    Clinical Decision Making Several treatment options, min-mod task modification necessary    Comorbidities Affecting Occupational Performance: May have comorbidities impacting occupational performance    Modification or Assistance to Complete Evaluation  Min-Moderate modification of tasks or assist with assess necessary to complete eval    OT Frequency 2x / week    OT Duration 8 weeks    OT Treatment/Interventions Self-care/ADL training;Therapeutic exercise;Visual/perceptual remediation/compensation;Patient/family education;Neuromuscular education;Building services engineer;Therapeutic activities;Balance training;Manual Therapy;DME and/or AE instruction;Aquatic Therapy;Splinting    Plan progress towards  remaining STG's, functional reaching    Consulted and Agree with Plan of Care Patient           Patient will benefit from skilled therapeutic intervention in order to improve the following deficits and impairments:   Body Structure / Function / Physical Skills: ADL, Coordination, Endurance, GMC, UE functional use, Balance, Decreased knowledge of precautions, Sensation, Vestibular, Vision, Pain, IADL, Body mechanics, Dexterity, FMC, Strength, ROM, Edema  Cognitive Skills: Attention, Memory     Visit Diagnosis: Muscle weakness (generalized)  Unsteadiness on feet  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 Near syncope 07/02/2019  . Passive smoke exposure 07/02/2019    [facility] 09/20/2019, 12:08 PM  [facility] [address] [place], [place], [address] Phone: [phone]   Fax:  [phone]  Name: Krisalyn Yankowski MRN: [id number] Date of Birth: 04/11/1965   Willa Frater, OTR/L [facility] [address] [place], [place]  [address] [phone] phone [phone] 09/20/19 12:08 PM

## 2019-09-23 ENCOUNTER — Other Ambulatory Visit: Payer: Self-pay

## 2019-09-23 ENCOUNTER — Ambulatory Visit: Payer: BC Managed Care – PPO

## 2019-09-23 DIAGNOSIS — M6281 Muscle weakness (generalized): Secondary | ICD-10-CM

## 2019-09-23 DIAGNOSIS — R2681 Unsteadiness on feet: Secondary | ICD-10-CM

## 2019-09-23 DIAGNOSIS — I69954 Hemiplegia and hemiparesis following unspecified cerebrovascular disease affecting left non-dominant side: Secondary | ICD-10-CM

## 2019-09-23 DIAGNOSIS — R2689 Other abnormalities of gait and mobility: Secondary | ICD-10-CM

## 2019-09-23 NOTE — Therapy (Signed)
[facility] [address] [place], [place], [address] Phone: [phone]   Fax:  [phone]  Physical Therapy Treatment  Patient Details  Name: Kimberly [name] MRN: [id number] Date of Birth: [birth year] Referring Provider (PT): Referred by: Cristal Ford, DO. Followed by Frann Rider, NP   Encounter Date: 09/23/2019   PT End of Session - 09/23/19 1151    Visit Number 18    Number of Visits 24    Date for PT Re-Evaluation 09/18/46   recert for 6 weeks, POC for 60 days   Authorization Type BCBS    PT Start Time 1856    PT Stop Time 1229    PT Time Calculation (min) 44 min    Equipment Utilized During Treatment Gait belt    Activity Tolerance Patient tolerated treatment well    Behavior During Therapy WFL for tasks assessed/performed           Past Medical History:  Diagnosis Date   Fibromyalgia    IBS (irritable bowel syndrome)     History reviewed. No pertinent surgical history.  There were no vitals filed for this visit.   Subjective Assessment - 09/23/19 1149    Subjective Patient reports that her disability was approved. Patient reports that the leg is feeling good today.    Patient is accompained by: Family member   Husband   Pertinent History Fibromyalgia and IBS    Limitations Walking;Standing;Lifting    How long can you walk comfortably? 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 MRI brain negative for stroke    Patient Stated Goals Be able to go back to "normal", go back to work    Currently in Pain? Yes    Pain Score 2     Pain Location Foot    Pain Orientation Left   around the big toe   Pain Descriptors / Indicators Tingling    Pain Type Chronic pain                             [facility] Adult PT Treatment/Exercise - 09/23/19 0001      Transfers   Transfers Sit to  Stand;Stand to Sit    Sit to Stand 5: Supervision    Stand to Sit 5: Supervision      Ambulation/Gait   Ambulation/Gait Yes    Ambulation/Gait Assistance 5: Supervision;4: Min guard    Ambulation/Gait Assistance Details Completed gait training initailly x 230 ft indoors without foot up brace applied with patient demo decreased clearance. Followed up with ambulation with foot up brace applied to L ankle, initailly completing gait over indoor surfaces x 345 ft. With brace applied patient demo improved clearance and reports feeling more confident in her walking. Continued gait training with brace applied on outdoor unlevel surfaces including pavement and grass x 200 ft with patient contuining to demo improvements in gait pattern. Patient reports that she beleives the brace is extremely beneficial and is interested in getting her one for further use.     Assistive device None    Gait Pattern Step-through pattern;Decreased arm swing - left;Decreased arm swing - right;Decreased step length - right;Decreased step length - left;Decreased stance time - left;Decreased hip/knee flexion - left;Decreased dorsiflexion - left;Decreased weight shift to left;Poor foot clearance - left    Ambulation Surface Level;Indoor;Unlevel;Outdoor;Paved;Grass  Ramp 4: Min assist    Ramp Details (indicate cue type and reason) completed ambulation up/down incline/ramp with pt demonstrating small steps and slow speed to attempt to maintain balance. PT providing CGA and providing verbal cues for improved step length with completion. Completed x 3 reps, down and back.       Self-Care   Self-Care Other Self-Care Comments    Other Self-Care Comments  PT educating on purhcase options and benefits of Foot Up Brace. Patient reporting that they would look into purchasing prior to next session.       Neuro Re-ed    Neuro Re-ed Details  Completed standing balance activities on incline: standing on upward incline completed static balance  3 x 30 seconds with narrow stance and eyes open, progresed to eyes closed 1 x 30 seconds. Increased sway noted with vision removed. Completed alternating forward/backward steps x 10 reps on upward incline with CGA. on downward incline completed feet together with eyes open 3 x 30 seconds, progressed to eyes closed x 30 seconds. Completed forward/posterior stepping x 10 reps on alternating BLE. Increased difficulty with activities on downward incline.                   PT Education - 09/23/19 1225    Education Details PT provided handout for purchase option for [place] Foot Up Brace    Person(s) Educated Patient;Spouse    Methods Explanation;Demonstration;Handout    Comprehension Verbalized understanding;Returned demonstration            PT Short Term Goals - 09/13/19 1240      PT SHORT TERM GOAL #1   Title Patient will demonstrate ability to complete 7 sit<> stands with 30 second chair stand to demo improved BLE strength    Baseline 5 sit <> stands, 6 sit <> stands w/ UE support    Time 3    Period Weeks    Status Not Met    Target Date 09/09/19      PT SHORT TERM GOAL #2   Title Patient will improve Berg Balance Score to 48/56 to demo improved balance and reduced risk for falls    Baseline 46/56, 49/56 on 09/13/19    Time 3    Period Weeks    Status Achieved    Target Date 09/09/19             PT Long Term Goals - 08/19/19 1152      PT LONG TERM GOAL #1   Title Pt will be independent with Final HEP (All LTG's Due: 09/30/2019)    Baseline PT continues to update HEP at this time    Time 6    Period Weeks    Status On-going    Target Date 09/30/19      PT LONG TERM GOAL #2   Title Patient will be able to ascend/descend 12 stairs, Mod I with alternating pattern and single rail for improved ability to enter/exit home    Baseline 8 stairs, alternating, single rail, min guard    Time 6    Period Weeks    Status Revised      PT LONG TERM GOAL #3   Title Patient will  demo ability to complete 10 sit <> stands in 30 seconds with UE support to demo improved LE strength/endurance    Baseline 2 sit <> stands, 5 sit <> stands on 7/15    Time 6    Period Weeks    Status On-going  PT LONG TERM GOAL #4   Title Patient will demo ability to ambulate >/= 1000 ft on various surfaces with LRAD to demo improved functional mobility and endurance    Baseline 600 ft on outdoor/indoor surfaces with SPC    Time 6    Period Weeks    Status Revised      PT LONG TERM GOAL #5   Title Patient will improved Berg Balance to >/= 50/56 points to demonstrate improvements in overall balance    Baseline 41/56, 46/56 on 7/15    Time 6    Period Weeks    Status Revised                 Plan - 09/23/19 1428    Clinical Impression Statement Completed gait training with Foot Up brace applied to LLE on indoors and outdoor surfaces, with patient demonstrating improved clearance with ambulation. patient also reports increased confidence with ambulation with brace applied. PT educating on purchase options at this time. Continued activites focused on balance on unlevel surfaces including ramp surface. Patient will continue to benefit from skilled PT services to progress toward all goals.    Personal Factors and Comorbidities Comorbidity 2    Comorbidities Fibromyalgia and IBS    Examination-Activity Limitations Bend;Squat;Stairs;Stand;Lift    Examination-Participation Restrictions Cleaning;Community Activity;Driving   Work   Stability/Clinical Decision Making Evolving/Moderate complexity    Rehab Potential Good    PT Frequency 2x / week    PT Duration 6 weeks    PT Treatment/Interventions ADLs/Self Care Home Management;Electrical Stimulation;Moist Heat;Cryotherapy;DME Instruction;Gait training;Stair training;Functional mobility training;Therapeutic activities;Therapeutic exercise;Balance training;Neuromuscular re-education;Patient/family education;Orthotic Fit/Training;Passive  range of motion    PT Next Visit Plan Did you recieve foot up brace? Continue Gait training w/o AD. LLE Strengthening. 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 [place], [place], [address] Phone: [phone]   Fax:  [phone]  Name: Cheree Fowles MRN: [id number] Date of Birth: 11-04-1965

## 2019-09-28 ENCOUNTER — Ambulatory Visit: Payer: BC Managed Care – PPO

## 2019-09-28 ENCOUNTER — Encounter: Payer: Self-pay | Admitting: Occupational Therapy

## 2019-09-28 ENCOUNTER — Other Ambulatory Visit: Payer: Self-pay

## 2019-09-28 ENCOUNTER — Ambulatory Visit: Payer: BC Managed Care – PPO | Admitting: Occupational Therapy

## 2019-09-28 DIAGNOSIS — M6281 Muscle weakness (generalized): Secondary | ICD-10-CM | POA: Diagnosis not present

## 2019-09-28 DIAGNOSIS — R2689 Other abnormalities of gait and mobility: Secondary | ICD-10-CM

## 2019-09-28 DIAGNOSIS — R2681 Unsteadiness on feet: Secondary | ICD-10-CM

## 2019-09-28 DIAGNOSIS — I69954 Hemiplegia and hemiparesis following unspecified cerebrovascular disease affecting left non-dominant side: Secondary | ICD-10-CM

## 2019-09-28 NOTE — Therapy (Signed)
[facility] [address] [place], [place], [address] Phone: [phone]   Fax:  [phone]  Occupational Therapy Treatment  Patient Details  Name: Kimberly [name] MRN: [id number] Date of Birth: [birth year] No data recorded  Encounter Date: 09/28/2019   OT End of Session - 09/28/19 1306    Visit Number 5    Number of Visits 17    Date for OT Re-Evaluation 11/13/19    Authorization Type BCBS - 60 visits combined PT/OT/ST    OT Start Time 1237    OT Stop Time 1315    OT Time Calculation (min) 38 min    Activity Tolerance Patient tolerated treatment well    Behavior During Therapy [facility] for tasks assessed/performed;Anxious           Past Medical History:  Diagnosis Date  . Fibromyalgia   . IBS (irritable bowel syndrome)     History reviewed. No pertinent surgical history.  There were no vitals filed for this visit.   Subjective Assessment - 09/28/19 1239    Subjective  Pt reports pain due to Fibromyalgia.  Pt reports that she can pull up pants/fasten button using LUE to assist but is still difficulty, but using RUE to assist.  Pt reports that she is doing more at home, but reports that she thinks she did too much.    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 Yes    Pain Score 4     Pain Location Arm    Pain Orientation Left    Pain Descriptors / Indicators Aching    Pain Type Acute pain    Pain Onset In the past 7 days    Pain Frequency Intermittent    Aggravating Factors  use    Pain Relieving Factors rest                Sitting, cane ex for shoulder abduction and closed-chain shoulder flex with BUEs with ball  With min v.c. for compensation. Seated and standing to place graded clothes pins on vertical antennae with  LUE for sustained pinch, min v.c Gripper set at level 2 for sustained grip, min difficulty. Ambulating while carrying plate of items in LUE, no drops, min difficulty. Grooved pegboard  For increased fine motor coordination with LUE, min difficulty, v.c to avoid shoulder hike. Stringing pegs, min difficulty using bilateral UE's                  OT Short Term Goals - 09/28/19 1239      OT SHORT TERM GOAL #1   Title Patient will complete a home exercise program designed to improve shoulder range of motion due 10/14/19    Time 4    Period Weeks    Status Achieved    Target Date 10/14/19      OT SHORT TERM GOAL #2   Title Patient will complete a home exercise program designed to improve hand strength    Time 4    Period Weeks    Status Achieved      OT SHORT TERM GOAL #3   Title Patient will incorporate demonstrate adequate strength and range of motion in left arm to reach low to obtain and replace  lightweight object (less than 1 lb) off table top while standing.  Time 4    Period Weeks    Status On-going      OT SHORT TERM GOAL #4   Title Patient willa ctively attend to familiar functional or novel task x 15 minutes in distracting environment without any prompting    Time 4    Period Weeks    Status On-going      OT SHORT TERM GOAL #5   Title Patient will utilize BUE to  reach low and show sufficient grasp strength to grasp and pull up and fasten pants.    Time 4    Period Weeks    Status Achieved   pt is performing            OT Long Term Goals - 08/30/19 1855      OT LONG TERM GOAL #1   Title Patient will complete an updated HEP which addresses sactive movement, strength and functional use of LUE    Time 8    Period Weeks    Status New    Target Date 11/13/19      OT LONG TERM GOAL #2   Title Patient will demonstrate mid level supported reach with LUE to obtain or put away lightweight dishes at chest height    Time 8    Period Weeks    Status New       OT LONG TERM GOAL #3   Title Patient will transport clothing to laundry and complete laundry with intermittent min assistance    Time 8    Period Weeks    Status New      OT LONG TERM GOAL #4   Title Patient will demonstrate at least 10 lb increase in grip strength to allow her to open bottles, carry items in LUE    Baseline 7.9 lbs    Time 8    Period Weeks    Status New                 Plan - 09/28/19 1239    Clinical Impression Statement Pt is progressing towards goals.  Pt reports helping with IADLS this weekend.    OT Occupational Profile and History Detailed Assessment- Review of Records and additional review of physical, cognitive, psychosocial history related to current functional performance    Occupational performance deficits (Please refer to evaluation for details): ADL's;IADL's;Work;Leisure    Body Structure / Function / Physical Skills ADL;Coordination;Endurance;GMC;UE functional use;Balance;Decreased knowledge of precautions;Sensation;Vestibular;Vision;Pain;IADL;Body mechanics;Dexterity;FMC;Strength;ROM;Edema    Cognitive Skills Attention;Memory    Rehab Potential Fair    Clinical Decision Making Several treatment options, min-mod task modification necessary    Comorbidities Affecting Occupational Performance: May have comorbidities impacting occupational performance    Modification or Assistance to Complete Evaluation  Min-Moderate modification of tasks or assist with assess necessary to complete eval    OT Frequency 2x / week    OT Duration 8 weeks    OT Treatment/Interventions Self-care/ADL training;Therapeutic exercise;Visual/perceptual remediation/compensation;Patient/family education;Neuromuscular education;Building services engineer;Therapeutic activities;Balance training;Manual Therapy;DME and/or AE instruction;Aquatic Therapy;Splinting    Plan progress towards remaining STG's, functional reaching    Consulted and Agree with Plan of Care Patient            Patient will benefit from skilled therapeutic intervention in order to improve the following deficits and impairments:   Body Structure / Function / Physical Skills: ADL, Coordination, Endurance, GMC, UE functional use, Balance, Decreased knowledge of precautions, Sensation, Vestibular, Vision, Pain, IADL, Body mechanics, Dexterity, FMC, Strength, ROM, Edema Cognitive Skills: Attention, Memory  Visit Diagnosis: Muscle weakness (generalized)  Unsteadiness on feet  Hemiplegia and hemiparesis following unspecified cerebrovascular disease affecting left non-dominant side [facility])    Problem List Patient Active Problem List   Diagnosis Date Noted  . 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 Near syncope 07/02/2019  . Passive smoke exposure 07/02/2019    [facility] [place] 09/28/2019, 1:10 PM Keene Breath, OTR/L [phone] Phone: [phone] 2:39 PM 09/28/19     [facility] [address] [place], [place], [address] Phone: [phone]   Fax:  [phone]  Name: Carisa Backhaus MRN: [id number] Date of Birth: 1965/02/10

## 2019-09-28 NOTE — Therapy (Signed)
[facility] [address] [place], [place], [address] Phone: [phone]   Fax:  [phone]  Physical Therapy Treatment  Patient Details  Name: Kimberly [name] MRN: [id number] Date of Birth: September 02, 1965 Referring Provider (PT): Referred by: Cristal Ford, DO. Followed by Frann Rider, NP   Encounter Date: 09/28/2019   PT End of Session - 09/28/19 1351    Visit Number 19    Number of Visits 24    Date for PT Re-Evaluation 99/77/41   recert for 6 weeks, POC for 60 days   Authorization Type BCBS    PT Start Time 1317    PT Stop Time 1400    PT Time Calculation (min) 43 min    Equipment Utilized During Treatment Gait belt    Activity Tolerance Patient tolerated treatment well    Behavior During Therapy WFL for tasks assessed/performed           Past Medical History:  Diagnosis Date  . Fibromyalgia   . IBS (irritable bowel syndrome)     No past surgical history on file.  There were no vitals filed for this visit.   Subjective Assessment - 09/28/19 1319    Subjective Patient reports no new changes/complaints. Did get toe up brace in the mail yesterday. Legs are tired today.    Patient is accompained by: Family member   Husband   Pertinent History Fibromyalgia and IBS    Limitations Walking;Standing;Lifting    How long can you walk comfortably? 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 MRI brain negative for stroke    Patient Stated Goals Be able to go back to "normal", go back to work    Currently in Pain? Yes    Pain Score 2     Pain Location Leg    Pain Orientation Left    Pain Descriptors / Indicators Aching    Pain Type Acute pain                             [facility] Adult PT Treatment/Exercise - 09/28/19 0001      Transfers   Transfers Sit to Stand;Stand to Sit    Comments  completed sit <> stand training with LLE psotioned slightly posterior, completing as many reps in 30 seconds. Patient able to complete 6 reps without UE support      Ambulation/Gait   Ambulation/Gait Yes    Ambulation/Gait Assistance 5: Supervision    Ambulation/Gait Assistance Details completed gait training without AD, PT providing manual faciliation for improved weight shift and step length. verbal cues for improved step length with RLE. all gait completed with Toe Up Brace applied to LLE.     Ambulation Distance (Feet) 450 Feet    Assistive device None    Gait Pattern Step-through pattern;Decreased arm swing - left;Decreased arm swing - right;Decreased step length - right;Decreased step length - left;Decreased stance time - left;Decreased hip/knee flexion - left;Decreased dorsiflexion - left;Decreased weight shift to left;Poor foot clearance - left    Ambulation Surface Level;Indoor    Gait Comments Completed gait training x 200 ft with walking sticks to faciliatie improved arm swing. PT providing verbal cues for improved completion. With ambulation after patient demo improved arm swing.       Self-Care   Self-Care Other Self-Care Comments    Other  Self-Care Comments  PT educating on proper donn/doffing of toe up brace on LLE. Pt demonstrating ability to complete process with minimal verbal cues. PT providing verbal cueing/demonstrating on easier positions to donn for improved ability to complete at home. Due to not aware of dates of september visits, PT educating on dates/times of appoitnments and provided with calender.       Exercises   Exercises Other Exercises    Other Exercises  Completed mini squat with touch down to seat x 10 reps, verbal cues for form.                     PT Short Term Goals - 09/13/19 1240      PT SHORT TERM GOAL #1   Title Patient will demonstrate ability to complete 7 sit<> stands with 30 second chair stand to demo improved BLE strength    Baseline  5 sit <> stands, 6 sit <> stands w/ UE support    Time 3    Period Weeks    Status Not Met    Target Date 09/09/19      PT SHORT TERM GOAL #2   Title Patient will improve Berg Balance Score to 48/56 to demo improved balance and reduced risk for falls    Baseline 46/56, 49/56 on 09/13/19    Time 3    Period Weeks    Status Achieved    Target Date 09/09/19             PT Long Term Goals - 08/19/19 1152      PT LONG TERM GOAL #1   Title Pt will be independent with Final HEP (All LTG's Due: 09/30/2019)    Baseline PT continues to update HEP at this time    Time 6    Period Weeks    Status On-going    Target Date 09/30/19      PT LONG TERM GOAL #2   Title Patient will be able to ascend/descend 12 stairs, Mod I with alternating pattern and single rail for improved ability to enter/exit home    Baseline 8 stairs, alternating, single rail, min guard    Time 6    Period Weeks    Status Revised      PT LONG TERM GOAL #3   Title Patient will demo ability to complete 10 sit <> stands in 30 seconds with UE support to demo improved LE strength/endurance    Baseline 2 sit <> stands, 5 sit <> stands on 7/15    Time 6    Period Weeks    Status On-going      PT LONG TERM GOAL #4   Title Patient will demo ability to ambulate >/= 1000 ft on various surfaces with LRAD to demo improved functional mobility and endurance    Baseline 600 ft on outdoor/indoor surfaces with SPC    Time 6    Period Weeks    Status Revised      PT LONG TERM GOAL #5   Title Patient will improved Berg Balance to >/= 50/56 points to demonstrate improvements in overall balance    Baseline 41/56, 46/56 on 7/15    Time 6    Period Weeks    Status Revised                 Plan - 09/28/19 1420    Clinical Impression Statement Patient recieved personal foot up brace. PT educating on proper donn/doffing and ways for improved  completion at home. Continued gait trianign focused on improved step length and gait  speed. Also completed gait training with walking sticks to further promote arm swing bilaterally. Will continue to progress.    Personal Factors and Comorbidities Comorbidity 2    Comorbidities Fibromyalgia and IBS    Examination-Activity Limitations Bend;Squat;Stairs;Stand;Lift    Examination-Participation Restrictions Cleaning;Community Activity;Driving   Work   Stability/Clinical Decision Making Evolving/Moderate complexity    Rehab Potential Good    PT Frequency 2x / week    PT Duration 6 weeks    PT Treatment/Interventions ADLs/Self Care Home Management;Electrical Stimulation;Moist Heat;Cryotherapy;DME Instruction;Gait training;Stair training;Functional mobility training;Therapeutic activities;Therapeutic exercise;Balance training;Neuromuscular re-education;Patient/family education;Orthotic Fit/Training;Passive range of motion    PT Next Visit Plan Check LTG's? 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 Near syncope 07/02/2019  . Passive smoke exposure 07/02/2019    Jones Bales, PT, DPT 09/28/2019, 2:23 PM  [facility] [address] [place], [place], [address] Phone:  [phone]   Fax:  [phone]  Name: Kimberly [name] MRN: [id number] Date of Birth: Apr 03, 1965

## 2019-09-30 ENCOUNTER — Ambulatory Visit: Payer: BC Managed Care – PPO

## 2019-09-30 ENCOUNTER — Ambulatory Visit: Payer: BC Managed Care – PPO | Admitting: Occupational Therapy

## 2019-10-04 ENCOUNTER — Ambulatory Visit: Payer: BC Managed Care – PPO | Admitting: Occupational Therapy

## 2019-10-04 ENCOUNTER — Other Ambulatory Visit: Payer: Self-pay

## 2019-10-04 ENCOUNTER — Ambulatory Visit: Payer: BC Managed Care – PPO

## 2019-10-04 DIAGNOSIS — R2681 Unsteadiness on feet: Secondary | ICD-10-CM

## 2019-10-04 DIAGNOSIS — M6281 Muscle weakness (generalized): Secondary | ICD-10-CM

## 2019-10-04 DIAGNOSIS — R41840 Attention and concentration deficit: Secondary | ICD-10-CM

## 2019-10-04 DIAGNOSIS — R2689 Other abnormalities of gait and mobility: Secondary | ICD-10-CM

## 2019-10-04 DIAGNOSIS — I69954 Hemiplegia and hemiparesis following unspecified cerebrovascular disease affecting left non-dominant side: Secondary | ICD-10-CM

## 2019-10-04 NOTE — Therapy (Signed)
[facility] [address] [place], [place], [address] Phone: [phone]   Fax:  [phone]  Occupational Therapy Treatment  Patient Details  Name: Kimberly [name] MRN: [id number] Date of Birth: 29-Nov-1965 No data recorded  Encounter Date: 10/04/2019   OT End of Session - 10/04/19 1413    Visit Number 6    Number of Visits 17    Date for OT Re-Evaluation 11/13/19    Authorization Type BCBS - 60 visits combined PT/OT/ST    OT Start Time 1230    OT Stop Time 1315    OT Time Calculation (min) 45 min    Activity Tolerance Patient tolerated treatment well    Behavior During Therapy [facility] for tasks assessed/performed;Anxious           Past Medical History:  Diagnosis Date  . Fibromyalgia   . IBS (irritable bowel syndrome)     No past surgical history on file.  There were no vitals filed for this visit.   Subjective Assessment - 10/04/19 1232    Subjective  My arm is more sore today but I also have fibromyalgia    Patient is accompanied by: Family member    Pertinent History verterbral artery dissection 07/01/19    Limitations **no pushing/pulling or lifting, no looking up w/ cervical extension, no extreme head turns    Currently in Pain? Yes    Pain Score 5     Pain Location Arm    Pain Orientation Left    Pain Descriptors / Indicators Sore;Radiating    Pain Type Chronic pain    Pain Onset More than a month ago    Pain Frequency Constant    Aggravating Factors  overuse    Pain Relieving Factors rest, heat, topical rubs           Pt reports deficits in depth perception and using Lt hand - had pt practice pouring cup of water into cups placed at various distances away from patient (while seated) for depth perception, control and functional use LUE - pt did not spill but had difficulty and hand would begin to shake d/t decreased control and strength. Pt then practiced carrying cup of water Rt hand and plate of  "food" Lt hand w/o spills/drops but mod to max difficulty. Pt then carried cup 1/2 full of water Lt hand around gym without spills but compensations and sh hiking noted.  Standing to fold clothes BUE's w/ compensations LUE.   Functional reaching in cabinet LUE to retrieve/replace cones (lowest level of high cabinets).   Pt demo cane HEP seated in shoulder flexion (cane vertical) and abd/scaption x 10 reps each. Supine: pt performed self ROM in flexion followed by bilateral sh flexion with cane for overhead ROM.  UE Ranger against gravity to just overhead sh flexion x 10 reps                       OT Short Term Goals - 10/04/19 1414      OT SHORT TERM GOAL #1   Title Patient will complete a home exercise program designed to improve shoulder range of motion due 10/14/19    Time 4    Period Weeks    Status Achieved    Target Date 10/14/19      OT SHORT TERM GOAL #2   Title Patient will complete a home exercise program designed to improve hand strength    Time 4    Period  Weeks    Status Achieved      OT SHORT TERM GOAL #3   Title Patient will incorporate demonstrate adequate strength and range of motion in left arm to reach low to obtain and replace  lightweight object (less than 1 lb) off table top while standing.    Time 4    Period Weeks    Status Achieved      OT SHORT TERM GOAL #4   Title Patient willa ctively attend to familiar functional or novel task x 15 minutes in distracting environment without any prompting    Time 4    Period Weeks    Status On-going      OT SHORT TERM GOAL #5   Title Patient will utilize BUE to  reach low and show sufficient grasp strength to grasp and pull up and fasten pants.    Time 4    Period Weeks    Status Achieved   pt is performing            OT Long Term Goals - 10/04/19 1414      OT LONG TERM GOAL #1   Title Patient will complete an updated HEP which addresses sactive movement, strength and functional use of LUE     Time 8    Period Weeks    Status New      OT LONG TERM GOAL #2   Title Patient will demonstrate mid level supported reach with LUE to obtain or put away lightweight dishes at chest height    Time 8    Period Weeks    Status New      OT LONG TERM GOAL #3   Title Patient will transport clothing to laundry and complete laundry with intermittent min assistance    Time 8    Period Weeks    Status On-going      OT LONG TERM GOAL #4   Title Patient will demonstrate at least 10 lb increase in grip strength to allow her to open bottles, carry items in LUE    Baseline 7.9 lbs    Time 8    Period Weeks    Status New                 Plan - 10/04/19 1415    Clinical Impression Statement Pt met STG #3 and progressing towards LTG #3. Pt progressing slowly with LUE strength/endurance    OT Occupational Profile and History Detailed Assessment- Review of Records and additional review of physical, cognitive, psychosocial history related to current functional performance    Occupational performance deficits (Please refer to evaluation for details): ADL's;IADL's;Work;Leisure    Body Structure / Function / Physical Skills ADL;Coordination;Endurance;GMC;UE functional use;Balance;Decreased knowledge of precautions;Sensation;Vestibular;Vision;Pain;IADL;Body mechanics;Dexterity;FMC;Strength;ROM;Edema    Cognitive Skills Attention;Memory    Rehab Potential Fair    Clinical Decision Making Several treatment options, min-mod task modification necessary    Comorbidities Affecting Occupational Performance: May have comorbidities impacting occupational performance    Modification or Assistance to Complete Evaluation  Min-Moderate modification of tasks or assist with assess necessary to complete eval    OT Frequency 2x / week    OT Duration 8 weeks    OT Treatment/Interventions Self-care/ADL training;Therapeutic exercise;Visual/perceptual remediation/compensation;Patient/family education;Neuromuscular  education;Therapist, nutritional;Therapeutic activities;Balance training;Manual Therapy;DME and/or AE instruction;Aquatic Therapy;Splinting    Plan check remaining STG, practice arm ladder, continue functional reaching LUE    Consulted and Agree with Plan of Care Patient  Patient will benefit from skilled therapeutic intervention in order to improve the following deficits and impairments:   Body Structure / Function / Physical Skills: ADL, Coordination, Endurance, GMC, UE functional use, Balance, Decreased knowledge of precautions, Sensation, Vestibular, Vision, Pain, IADL, Body mechanics, Dexterity, FMC, Strength, ROM, Edema Cognitive Skills: Attention, Memory     Visit Diagnosis: Muscle weakness (generalized)  Attention and concentration deficit    Problem List Patient Active Problem List   Diagnosis Date Noted  . Vertebral artery dissection ([facility]) 07/02/2019  . Near syncope 07/02/2019  . Passive smoke exposure 07/02/2019    Carey Bullocks, OTR/L 10/04/2019, 2:16 PM  [facility] [address] [place], [place], [address] Phone: [phone]   Fax:  [phone]  Name: Treina Arscott MRN: [id number] Date of Birth: 12-02-1965

## 2019-10-04 NOTE — Therapy (Signed)
[facility] [address] [place], [place], [address] Phone: [phone]   Fax:  [phone]  Physical Therapy Treatment/Re-Certification  Patient Details  Name: Kimberly [name] MRN: [id number] Date of Birth: Mar 04, 1965 Referring Provider (PT): Referred by: Cristal Ford, DO. Followed by Frann Rider, NP   Encounter Date: 10/04/2019   PT End of Session - 10/04/19 1320    Visit Number 20    Number of Visits 26    Date for PT Re-Evaluation 01/02/20   Updated POC for 6 weeks, Cert for 90 days   Authorization Type BCBS    PT Start Time 1316    PT Stop Time 1359    PT Time Calculation (min) 43 min    Equipment Utilized During Treatment Gait belt    Activity Tolerance Patient tolerated treatment well    Behavior During Therapy WFL for tasks assessed/performed           Past Medical History:  Diagnosis Date   Fibromyalgia    IBS (irritable bowel syndrome)     History reviewed. No pertinent surgical history.  There were no vitals filed for this visit.   Subjective Assessment - 10/04/19 1319    Subjective Patient reports vestibular testing was rough, has been having some vertigo. Feels like she has been off since.    Patient is accompained by: Family member   Husband   Pertinent History Fibromyalgia and IBS    Limitations Walking;Standing;Lifting    How long can you walk comfortably? 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 MRI brain negative for stroke    Patient Stated Goals Be able to go back to "normal", go back to work    Currently in Pain? Yes    Pain Score 6     Pain Location Leg    Pain Orientation Left    Pain Descriptors / Indicators Aching    Pain Type Chronic pain              [facility] PT Assessment - 10/04/19 0001      Assessment   Medical Diagnosis Vertebral Artery Dissection     Referring Provider (PT) Referred by: Cristal Ford, DO. Followed by Frann Rider, NP                [facility] Adult PT Treatment/Exercise - 10/04/19 0001      Transfers   Transfers Sit to Stand;Stand to Sit    Sit to Stand 6: Modified independent (Device/Increase time)    Five time sit to stand comments  30 second chair stand test: able to complete 6 sit <> stands w/ UE support    Stand to Sit 6: Modified independent (Device/Increase time)    Comments With [employer], patient able to complete 1 sit <> stand without UE support but increased difficulty noted and LE shakiness with completion      Ambulation/Gait   Ambulation/Gait Yes    Ambulation/Gait Assistance 6: Modified independent (Device/Increase time)    Ambulation/Gait Assistance Details completed gait training with [facility] patient patient able to ambulate 850 ft with SPC and R Foot Up brace donned. At end of ambulation, patient reports 4/10 in terms of SOB, increased fatigue in LLE upon completion.     Ambulation Distance (Feet) 850 Feet    Assistive device Straight cane    Gait Pattern Step-through pattern;Decreased arm swing - left;Decreased arm  swing - right;Decreased step length - right;Decreased step length - left;Decreased stance time - left;Decreased hip/knee flexion - left;Decreased dorsiflexion - left;Decreased weight shift to left;Poor foot clearance - left    Ambulation Surface Level;Indoor    Gait velocity 16.57 secs = 1.97 ft/sec   completed without AD   Stairs Yes    Stairs Assistance 5: Supervision    Stairs Assistance Details (indicate cue type and reason) ascending/descending with single rail and alternating pattern     Stair Management Technique One rail Right;Alternating pattern;Forwards    Number of Stairs 12    Height of Stairs 6      Standardized Balance Assessment   Standardized Balance Assessment Berg Balance Test;Timed Up and Go Test      Berg Balance Test   Sit to Stand Able to stand without using  hands and stabilize independently    Standing Unsupported Able to stand safely 2 minutes    Sitting with Back Unsupported but Feet Supported on Floor or Stool Able to sit safely and securely 2 minutes    Stand to Sit Sits safely with minimal use of hands    Transfers Able to transfer safely, minor use of hands    Standing Unsupported with Eyes Closed Able to stand 10 seconds safely    Standing Ubsupported with Feet Together Able to place feet together independently and stand 1 minute safely    From Standing, Reach Forward with Outstretched Arm Can reach confidently >25 cm (10")    From Standing Position, Pick up Object from Floor Able to pick up shoe safely and easily    From Standing Position, Turn to Look Behind Over each Shoulder Looks behind from both sides and weight shifts well    Turn 360 Degrees Able to turn 360 degrees safely one side only in 4 seconds or less    Standing Unsupported, Alternately Place Feet on Step/Stool Able to stand independently and safely and complete 8 steps in 20 seconds    Standing Unsupported, One Foot in Front Able to plae foot ahead of the other independently and hold 30 seconds    Standing on One Leg Able to lift leg independently and hold 5-10 seconds    Total Score 53      Timed Up and Go Test   TUG Normal TUG    Normal TUG (seconds) 17.31   completed w/o AD                 PT Education - 10/04/19 1642    Education Details Progress toward LTG; Updated POC    Person(s) Educated Patient;Spouse    Methods Explanation    Comprehension Verbalized understanding            PT Short Term Goals - 09/13/19 1240      PT SHORT TERM GOAL #1   Title Patient will demonstrate ability to complete 7 sit<> stands with 30 second chair stand to demo improved BLE strength    Baseline 5 sit <> stands, 6 sit <> stands w/ UE support    Time 3    Period Weeks    Status Not Met    Target Date 09/09/19      PT SHORT TERM GOAL #2   Title Patient will  improve Berg Balance Score to 48/56 to demo improved balance and reduced risk for falls    Baseline 46/56, 49/56 on 09/13/19    Time 3    Period Weeks    Status Achieved  Target Date 09/09/19             PT Long Term Goals - 10/04/19 1322      PT LONG TERM GOAL #1   Title Pt will be independent with Final HEP (All LTG's Due: 09/30/2019)    Baseline Patient reports independence with HEP    Time 6    Period Weeks    Status Achieved      PT LONG TERM GOAL #2   Title Patient will be able to ascend/descend 12 stairs, Mod I with alternating pattern and single rail for improved ability to enter/exit home    Baseline 12 stairs, alternating, single rail, superivsion    Time 6    Period Weeks    Status Partially Met      PT LONG TERM GOAL #3   Title Patient will demo ability to complete 10 sit <> stands in 30 seconds with UE support to demo improved LE strength/endurance    Baseline 2 sit <> stands, 5 sit <> stands on 7/15, 6 sit <> stands on 8/30    Time 6    Period Weeks    Status On-going     PT LONG TERM GOAL #4   Title Patient will demo ability to ambulate >/= 1000 ft on various surfaces with LRAD to demo improved functional mobility and endurance    Baseline 850 ft on indoor surfaces w/ SPC    Time 6    Period Weeks    Status On-going      PT LONG TERM GOAL #5   Title Patient will improved Berg Balance to >/= 50/56 points to demonstrate improvements in overall balance    Baseline 41/56, 46/56 on 7/15, 53/56 on 10/04/19    Time 6    Period Weeks    Status Achieved          Updated Short Term Goals:    PT Short Term Goals - 10/04/19 1651      PT SHORT TERM GOAL #1   Title Patient will complete TUG w/o AD in </= 15 secs to demonstrate improved functional mobility (ALL STG DUE: 10/25/19)    Baseline 17.31 secs    Time 3    Period Weeks    Status New    Target Date 10/25/19      PT SHORT TERM GOAL #2   Title Patient will demo ability to complete 7 sit <> stands  with 30 second chair stand test to demo improved functional strength    Baseline 6    Time 3    Period Weeks    Status New           Updated Long Term Goals:   PT Long Term Goals - 10/04/19 1322      PT LONG TERM GOAL #1   Title Pt will be independent with Final HEP and walking program (All LTG's Due: 11/15/2019)    Baseline continue to progress HEP    Time 6    Period Weeks    Status Revised    Target Date 11/15/19      PT LONG TERM GOAL #2   Title Patient will be able to ascend/descend 12 stairs, Mod I with alternating pattern and single rail for improved ability to enter/exit home    Baseline 12 stairs, alternating, single rail, superivsion    Time 6    Period Weeks    Status On-going      PT LONG TERM GOAL #3  Title Patient will demo ability to complete 10 sit <> stands in 30 seconds with UE support to demo improved LE strength/endurance    Baseline 2 sit <> stands, 5 sit <> stands on 7/15, 6 sit <> stands on 8/30    Time 6    Period Weeks    Status On-going      PT LONG TERM GOAL #4   Title Patient will demo ability to ambulate >/= 1000 ft on various surfaces with LRAD to demo improved functional mobility and endurance    Baseline 850 ft on indoor surfaces w/ SPC    Time 6    Period Weeks    Status On-going      PT LONG TERM GOAL #5   Title Patient will improve TUG </= 12 seconds without AD to demo reduced fall risk    Baseline 17.31 secs    Time 6    Period Weeks    Status New      Additional Long Term Goals   Additional Long Term Goals Yes      PT LONG TERM GOAL #6   Title Patient will improve gait speed to >/= 2.3 ft/sec to demonstrate improved functional mobility    Baseline 1.97 ft/sec    Time 6    Period Weeks    Status New                Plan - 10/04/19 1647    Clinical Impression Statement Today's skilled PT session included assessment of patient's progress toward LTG. Patient has demonstrated progress with PT services including  improved balance and reduced fall risk with Berg Balance score of 53/56. Patient still demonstrates decreased functional leg strength and decreased activity tolerance due to fatigue in BLE. Patient will benefit from continued PT services to progress toward all goals and maximize functional mobility.    Personal Factors and Comorbidities Comorbidity 2    Comorbidities Fibromyalgia and IBS    Examination-Activity Limitations Bend;Squat;Stairs;Stand;Lift    Examination-Participation Restrictions Cleaning;Community Activity;Driving   Work   Stability/Clinical Decision Making Evolving/Moderate complexity    Rehab Potential Good    PT Frequency 1x / week    PT Duration 6 weeks    PT Treatment/Interventions ADLs/Self Care Home Management;Electrical Stimulation;Moist Heat;Cryotherapy;DME Instruction;Gait training;Stair training;Functional mobility training;Therapeutic activities;Therapeutic exercise;Balance training;Neuromuscular re-education;Patient/family education;Orthotic Fit/Training;Passive range of motion    PT Next Visit Plan Did get appts scheduled? BLE strengthening. Gait. DGI?    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

## 2019-10-06 ENCOUNTER — Other Ambulatory Visit: Payer: Self-pay

## 2019-10-06 ENCOUNTER — Ambulatory Visit: Payer: BC Managed Care – PPO | Admitting: Occupational Therapy

## 2019-10-06 ENCOUNTER — Ambulatory Visit: Payer: BC Managed Care – PPO | Attending: Internal Medicine

## 2019-10-06 DIAGNOSIS — R208 Other disturbances of skin sensation: Secondary | ICD-10-CM

## 2019-10-06 DIAGNOSIS — R2689 Other abnormalities of gait and mobility: Secondary | ICD-10-CM | POA: Diagnosis present

## 2019-10-06 DIAGNOSIS — R41842 Visuospatial deficit: Secondary | ICD-10-CM | POA: Diagnosis present

## 2019-10-06 DIAGNOSIS — M6281 Muscle weakness (generalized): Secondary | ICD-10-CM | POA: Diagnosis not present

## 2019-10-06 DIAGNOSIS — I69354 Hemiplegia and hemiparesis following cerebral infarction affecting left non-dominant side: Secondary | ICD-10-CM | POA: Diagnosis present

## 2019-10-06 DIAGNOSIS — M25622 Stiffness of left elbow, not elsewhere classified: Secondary | ICD-10-CM | POA: Diagnosis present

## 2019-10-06 DIAGNOSIS — I69954 Hemiplegia and hemiparesis following unspecified cerebrovascular disease affecting left non-dominant side: Secondary | ICD-10-CM | POA: Insufficient documentation

## 2019-10-06 DIAGNOSIS — R209 Unspecified disturbances of skin sensation: Secondary | ICD-10-CM | POA: Insufficient documentation

## 2019-10-06 DIAGNOSIS — R2681 Unsteadiness on feet: Secondary | ICD-10-CM

## 2019-10-06 NOTE — Therapy (Signed)
[facility] [address] [place], [place], [address] Phone: [phone]   Fax:  [phone]  Occupational Therapy Treatment  Patient Details  Name: Kimberly [name] MRN: [id number] Date of Birth: 02-24-1965 No data recorded  Encounter Date: 10/06/2019   OT End of Session - 10/06/19 1112    Visit Number 7    Number of Visits 17    Date for OT Re-Evaluation 11/13/19    Authorization Type BCBS - 60 visits combined PT/OT/ST    Authorization - Visit Number 7    Authorization - Number of Visits 30    OT Start Time 1106    OT Stop Time 1145    OT Time Calculation (min) 39 min    Activity Tolerance Patient tolerated treatment well    Behavior During Therapy [facility] for tasks assessed/performed;Anxious           Past Medical History:  Diagnosis Date  . Fibromyalgia   . IBS (irritable bowel syndrome)     No past surgical history on file.  There were no vitals filed for this visit.   Subjective Assessment - 10/06/19 1107    Subjective  "I've got a little bit of shaking this morning"    Patient is accompanied by: Family member    Pertinent History verterbral artery dissection 07/01/19    Limitations **no pushing/pulling or lifting, no looking up w/ cervical extension, no extreme head turns    Currently in Pain? Yes    Pain Score 7     Pain Location Arm    Pain Orientation Left    Pain Descriptors / Indicators Aching    Pain Type Chronic pain    Pain Onset More than a month ago    Pain Frequency Constant    Aggravating Factors  weather and being active                 Treatment: Patient reports increased pain with weather this day. Patient completed washer activity in standing with rubber washers and different sized poles with LUE. Patient removed washers while in sitting. Table top slides for shoulder flexion/extension x 10 with supervision and verbal instructions and imitation. Arm ladder - unweighted dowel x 10  reps total with BUE while seated with supervision and min verbal cues. Pain reported increased pain at top 2 levels and downgraded to lower 3 levels for last 6 reps. Completed novel task on vertical surface - small peg board with LUE with moderate pattern. No cues required for copying pattern accurately. Pt required supervision for standing balance. Patient completed removing small pegs with LUE with emphasis on in hand manipulation with min verbal cues for reminders for technique. 5 minutes of UEB with 2.5 forward and 2.5 backward with supervision and zero resistance.                  OT Short Term Goals - 10/06/19 1145      OT SHORT TERM GOAL #1   Title Patient will complete a home exercise program designed to improve shoulder range of motion due 10/14/19    Time 4    Period Weeks    Status Achieved    Target Date 10/14/19      OT SHORT TERM GOAL #2   Title Patient will complete a home exercise program designed to improve hand strength    Time 4    Period Weeks    Status Achieved      OT SHORT TERM GOAL #  3   Title Patient will incorporate demonstrate adequate strength and range of motion in left arm to reach low to obtain and replace  lightweight object (less than 1 lb) off table top while standing.    Time 4    Period Weeks    Status Achieved      OT SHORT TERM GOAL #4   Title Patient willa ctively attend to familiar functional or novel task x 15 minutes in distracting environment without any prompting   Completed novel task/pattern with small pegs with min verbal cues for technique for removing pegs for in hand manipulation.   Time 4    Period Weeks    Status On-going      OT SHORT TERM GOAL #5   Title Patient will utilize BUE to  reach low and show sufficient grasp strength to grasp and pull up and fasten pants.    Time 4    Period Weeks    Status Achieved   pt is performing            OT Long Term Goals - 10/04/19 1414      OT LONG TERM GOAL #1   Title  Patient will complete an updated HEP which addresses sactive movement, strength and functional use of LUE    Time 8    Period Weeks    Status New      OT LONG TERM GOAL #2   Title Patient will demonstrate mid level supported reach with LUE to obtain or put away lightweight dishes at chest height    Time 8    Period Weeks    Status New      OT LONG TERM GOAL #3   Title Patient will transport clothing to laundry and complete laundry with intermittent min assistance    Time 8    Period Weeks    Status On-going      OT LONG TERM GOAL #4   Title Patient will demonstrate at least 10 lb increase in grip strength to allow her to open bottles, carry items in LUE    Baseline 7.9 lbs    Time 8    Period Weeks    Status New                 Plan - 10/06/19 1125    Clinical Impression Statement Pt progressing towards remaining STG and LTGs. Patient limited by pain in LUE.    OT Occupational Profile and History Detailed Assessment- Review of Records and additional review of physical, cognitive, psychosocial history related to current functional performance    Occupational performance deficits (Please refer to evaluation for details): ADL's;IADL's;Work;Leisure    Body Structure / Function / Physical Skills ADL;Coordination;Endurance;GMC;UE functional use;Balance;Decreased knowledge of precautions;Sensation;Vestibular;Vision;Pain;IADL;Body mechanics;Dexterity;FMC;Strength;ROM;Edema    Cognitive Skills Attention;Memory    Rehab Potential Fair    Clinical Decision Making Several treatment options, min-mod task modification necessary    Comorbidities Affecting Occupational Performance: May have comorbidities impacting occupational performance    Modification or Assistance to Complete Evaluation  Min-Moderate modification of tasks or assist with assess necessary to complete eval    OT Frequency 2x / week    OT Duration 8 weeks    OT Treatment/Interventions Self-care/ADL training;Therapeutic  exercise;Visual/perceptual remediation/compensation;Patient/family education;Neuromuscular education;Building services engineer;Therapeutic activities;Balance training;Manual Therapy;DME and/or AE instruction;Aquatic Therapy;Splinting    Plan environmental scanning with attention to head turns and precautions    Consulted and Agree with Plan of Care Patient  Patient will benefit from skilled therapeutic intervention in order to improve the following deficits and impairments:   Body Structure / Function / Physical Skills: ADL, Coordination, Endurance, GMC, UE functional use, Balance, Decreased knowledge of precautions, Sensation, Vestibular, Vision, Pain, IADL, Body mechanics, Dexterity, FMC, Strength, ROM, Edema Cognitive Skills: Attention, Memory     Visit Diagnosis: Muscle weakness (generalized)  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 Near syncope 07/02/2019  . Passive smoke exposure 07/02/2019    Kallie Edward, MOT, OTR/L Occupational Therapist  Junious Dresser 10/06/2019, 11:48 AM  [facility] [address] [place], [place], [address] Phone: [phone]   Fax:  [phone]  Name: Brookelin Felber MRN: [id number] Date of Birth: [birth year]

## 2019-10-06 NOTE — Therapy (Signed)
[facility] [address] [place], [place], [address] Phone: [phone]   Fax:  [phone]  Physical Therapy Treatment  Patient Details  Name: Tyreka Henneke MRN: [id number] Date of Birth: 27-Oct-1965 Referring Provider (PT): Referred by: Edsel Petrin, DO. Followed by Ihor Austin, NP   Encounter Date: 10/06/2019   PT End of Session - 10/06/19 1150    Visit Number 21    Number of Visits 26    Date for PT Re-Evaluation 01/02/20   Updated POC for 6 weeks, Cert for 90 days   Authorization Type BCBS    PT Start Time 1147    PT Stop Time 1230    PT Time Calculation (min) 43 min    Equipment Utilized During Treatment Gait belt    Activity Tolerance Patient tolerated treatment well    Behavior During Therapy WFL for tasks assessed/performed           Past Medical History:  Diagnosis Date  . Fibromyalgia   . IBS (irritable bowel syndrome)     No past surgical history on file.  There were no vitals filed for this visit.   Subjective Assessment - 10/06/19 1148    Subjective Patient reports gets results from vestibular testing tomorrow. Patient reports L leg feels tender today.    Patient is accompained by: Family member   Husband   Pertinent History Fibromyalgia and IBS    Limitations Walking;Standing;Lifting    How long can you walk comfortably? 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 MRI brain negative for stroke    Patient Stated Goals Be able to go back to "normal", go back to work    Currently in Pain? Yes    Pain Score 6     Pain Location Leg    Pain Orientation Left    Pain Descriptors / Indicators Aching    Pain Type Chronic pain                             [facility] Adult PT Treatment/Exercise - 10/06/19 0001      Transfers   Transfers Sit to Stand;Stand to Sit    Sit to  Stand 6: Modified independent (Device/Increase time)    Stand to Sit 6: Modified independent (Device/Increase time)    Comments completd sit <> stands with feet even, x 5 reps without UE support. increased time required for completion.       Ambulation/Gait   Ambulation/Gait Yes    Ambulation/Gait Assistance 5: Supervision    Ambulation/Gait Assistance Details completed gait training intially x 115 ft withotu AD. Progressed to completing gait training with metronome provided cues for cadence. metronome set at 85-88 completed x 300 ft. Increased fatigue in BLE due to increased speed with ambulation.     Ambulation Distance (Feet) 415 Feet    Assistive device None    Gait Pattern Step-through pattern;Decreased arm swing - left;Decreased arm swing - right;Decreased step length - right;Decreased step length - left;Decreased stance time - left;Decreased hip/knee flexion - left;Decreased dorsiflexion - left;Decreased weight shift to left;Poor foot clearance - left    Ambulation Surface Level;Indoor      Neuro Re-ed    Neuro Re-ed Details  Completed side stepping with 1# weighted ball, completed over head press x 10, with overhead press completed between each side  step      Exercises   Exercises Other Exercises;Knee/Hip    Other Exercises  Completed mini squat with touch down to seat x 10 reps holding 1# weighted ball to elevated mat.       Knee/Hip Exercises: Standing   Other Standing Knee Exercises Completed alternating marching with BLE, with 1# ankle weight on LLE completed x 10 reps, progressed to completing x 10 reps without weight. Increased range noted with completion.     Other Standing Knee Exercises Completed bilateral heel raises x 10 reps with light UE support with RUE through chair.       Knee/Hip Exercises: Seated   Long Arc Quad Strengthening;Left;2 sets;5 reps;Limitations    Long Arc Quad Weight 1 lbs.   ankle weight on RLE   Long Arc Quad Limitations short rest break between  with completion. verbal cues to control with knee flexion. shakiness in LLE noted with increased reps.    Hamstring Curl Strengthening;Both;2 sets;10 reps;Limitations    Hamstring Limitations verbal cues for control with completion. completed with red theraband.                     PT Short Term Goals - 10/04/19 1651      PT SHORT TERM GOAL #1   Title Patient will complete TUG w/o AD in </= 15 secs to demonstrate improved functional mobility (ALL STG DUE: 10/25/19)    Baseline 17.31 secs    Time 3    Period Weeks    Status New    Target Date 10/25/19      PT SHORT TERM GOAL #2   Title Patient will demo ability to complete 7 sit <> stands with 30 second chair stand test to demo improved functional strength    Baseline 6    Time 3    Period Weeks    Status New             PT Long Term Goals - 10/04/19 1322      PT LONG TERM GOAL #1   Title Pt will be independent with Final HEP and walking program (All LTG's Due: 11/15/2019)    Baseline continue to progress HEP    Time 6    Period Weeks    Status Revised    Target Date 11/15/19      PT LONG TERM GOAL #2   Title Patient will be able to ascend/descend 12 stairs, Mod I with alternating pattern and single rail for improved ability to enter/exit home    Baseline 12 stairs, alternating, single rail, superivsion    Time 6    Period Weeks    Status On-going      PT LONG TERM GOAL #3   Title Patient will demo ability to complete 10 sit <> stands in 30 seconds with UE support to demo improved LE strength/endurance    Baseline 2 sit <> stands, 5 sit <> stands on 7/15, 6 sit <> stands on 8/30    Time 6    Period Weeks    Status On-going      PT LONG TERM GOAL #4   Title Patient will demo ability to ambulate >/= 1000 ft on various surfaces with LRAD to demo improved functional mobility and endurance    Baseline 850 ft on indoor surfaces w/ SPC    Time 6    Period Weeks    Status On-going      PT LONG TERM GOAL #5  Title Patient will improve TUG </= 12 seconds without AD to demo reduced fall risk    Baseline 17.31 secs    Time 6    Period Weeks    Status New      Additional Long Term Goals   Additional Long Term Goals Yes      PT LONG TERM GOAL #6   Title Patient will improve gait speed to >/= 2.3 ft/sec to demonstrate improved functional mobility    Baseline 1.97 ft/sec    Time 6    Period Weeks    Status New                 Plan - 10/06/19 1239    Clinical Impression Statement Continued gait training today without AD, added in metronome to work on improved cadence with ambulation. Continued seated/standing BLE strengthening exercies to target functional strength. Intermittent rest breaks required, overall patient tolerating well. Will continue to progress toward goals.    Personal Factors and Comorbidities Comorbidity 2    Comorbidities Fibromyalgia and IBS    Examination-Activity Limitations Bend;Squat;Stairs;Stand;Lift    Examination-Participation Restrictions Cleaning;Community Activity;Driving   Work   Stability/Clinical Decision Making Evolving/Moderate complexity    Rehab Potential Good    PT Frequency 1x / week    PT Duration 6 weeks    PT Treatment/Interventions ADLs/Self Care Home Management;Electrical Stimulation;Moist Heat;Cryotherapy;DME Instruction;Gait training;Stair training;Functional mobility training;Therapeutic activities;Therapeutic exercise;Balance training;Neuromuscular re-education;Patient/family education;Orthotic Fit/Training;Passive range of motion    PT Next Visit Plan BLE strengthening. Gait. Update HEP as able.    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 Near syncope 07/02/2019  . Passive smoke exposure 07/02/2019    Tempie Donning, PT, DPT 10/06/2019, 12:41 PM  [facility] [address] [place], [place], [address] Phone: [phone]   Fax:  [phone]  Name: Arminda Foglio MRN: [id number] Date of Birth: May 31, 1965

## 2019-10-12 ENCOUNTER — Other Ambulatory Visit: Payer: Self-pay

## 2019-10-12 ENCOUNTER — Encounter: Payer: Self-pay | Admitting: Occupational Therapy

## 2019-10-12 ENCOUNTER — Ambulatory Visit: Payer: BC Managed Care – PPO | Admitting: Occupational Therapy

## 2019-10-12 ENCOUNTER — Ambulatory Visit: Payer: BC Managed Care – PPO

## 2019-10-12 DIAGNOSIS — R2681 Unsteadiness on feet: Secondary | ICD-10-CM

## 2019-10-12 DIAGNOSIS — I69954 Hemiplegia and hemiparesis following unspecified cerebrovascular disease affecting left non-dominant side: Secondary | ICD-10-CM

## 2019-10-12 DIAGNOSIS — M6281 Muscle weakness (generalized): Secondary | ICD-10-CM | POA: Diagnosis not present

## 2019-10-12 DIAGNOSIS — M25622 Stiffness of left elbow, not elsewhere classified: Secondary | ICD-10-CM

## 2019-10-12 DIAGNOSIS — I69354 Hemiplegia and hemiparesis following cerebral infarction affecting left non-dominant side: Secondary | ICD-10-CM

## 2019-10-12 DIAGNOSIS — R41842 Visuospatial deficit: Secondary | ICD-10-CM

## 2019-10-12 NOTE — Therapy (Signed)
[facility] [address] [place], [place], [address] Phone: [phone]   Fax:  [phone]  Occupational Therapy Treatment  Patient Details  Name: Kimberly [name] MRN: [id number] Date of Birth: 1965/12/05 No data recorded  Encounter Date: 10/12/2019   OT End of Session - 10/12/19 1457    Visit Number 8    Number of Visits 17    Date for OT Re-Evaluation 11/13/19    Authorization Type BCBS - 60 visits combined PT/OT/ST    Authorization - Visit Number 8    Authorization - Number of Visits 30    OT Start Time 1453    OT Stop Time 1531    OT Time Calculation (min) 38 min    Activity Tolerance Patient tolerated treatment well    Behavior During Therapy WFL for tasks assessed/performed           Past Medical History:  Diagnosis Date  . Fibromyalgia   . IBS (irritable bowel syndrome)     History reviewed. No pertinent surgical history.  There were no vitals filed for this visit.   Subjective Assessment - 10/12/19 1454    Subjective  "I've been trying to do more around the house"    Patient is accompanied by: Family member    Pertinent History verterbral artery dissection 07/01/19    Limitations **no pushing/pulling or lifting, no looking up w/ cervical extension, no extreme head turns    Currently in Pain? No/denies    Pain Onset More than a month ago                Treatment:   Grip strength and assessing ability to open containers of varying sizes and difficulty. Patient did not have any difficulty and demonstrated several ways to open containers.   Pt issued table stretch for shoulder flexion - see patient instructions. Patient demonstrated understanding.  Resistance Clothespins with LUE - varying resistance levels with on elevated surface (antenna). Patient completed in sitting and standing for higher level with close supevision. To remove - patient reached across with LUE and placed on table for  dynamic reaching while in standing with unilateral support and weight bearing in RUE.  Placed all clothespins back with 50% with LUE.                  OT Short Term Goals - 10/06/19 1145      OT SHORT TERM GOAL #1   Title Patient will complete a home exercise program designed to improve shoulder range of motion due 10/14/19    Time 4    Period Weeks    Status Achieved    Target Date 10/14/19      OT SHORT TERM GOAL #2   Title Patient will complete a home exercise program designed to improve hand strength    Time 4    Period Weeks    Status Achieved      OT SHORT TERM GOAL #3   Title Patient will incorporate demonstrate adequate strength and range of motion in left arm to reach low to obtain and replace  lightweight object (less than 1 lb) off table top while standing.    Time 4    Period Weeks    Status Achieved      OT SHORT TERM GOAL #4   Title Patient willa ctively attend to familiar functional or novel task x 15 minutes in distracting environment without any prompting   Completed novel task/pattern with small pegs  with min verbal cues for technique for removing pegs for in hand manipulation.   Time 4    Period Weeks    Status On-going      OT SHORT TERM GOAL #5   Title Patient will utilize BUE to  reach low and show sufficient grasp strength to grasp and pull up and fasten pants.    Time 4    Period Weeks    Status Achieved   pt is performing            OT Long Term Goals - 10/12/19 1518      OT LONG TERM GOAL #1   Title Patient will complete an updated HEP which addresses sactive movement, strength and functional use of LUE    Time 8    Period Weeks    Status On-going   issued HEP of table exercises for shoulder flexion     OT LONG TERM GOAL #2   Title Patient will demonstrate mid level supported reach with LUE to obtain or put away lightweight dishes at chest height    Time 8    Period Weeks    Status New      OT LONG TERM GOAL #3   Title  Patient will transport clothing to laundry and complete laundry with intermittent min assistance    Time 8    Period Weeks    Status On-going      OT LONG TERM GOAL #4   Title Patient will demonstrate at least 10 lb increase in grip strength to allow her to open bottles, carry items in LUE    Baseline 7.9 lbs    Time 8    Period Weeks    Status On-going   demonstrated opening varying bottles of diferent sizes and shapes - 10/12/19                Plan - 10/12/19 1456    Clinical Impression Statement Pt progressing towards remaining STG and LTGs. Patient responded better to therapy today with no presence of pain as a limiting factor.    OT Occupational Profile and History Detailed Assessment- Review of Records and additional review of physical, cognitive, psychosocial history related to current functional performance    Occupational performance deficits (Please refer to evaluation for details): ADL's;IADL's;Work;Leisure    Body Structure / Function / Physical Skills ADL;Coordination;Endurance;GMC;UE functional use;Balance;Decreased knowledge of precautions;Sensation;Vestibular;Vision;Pain;IADL;Body mechanics;Dexterity;FMC;Strength;ROM;Edema    Cognitive Skills Attention;Memory    Rehab Potential Fair    Clinical Decision Making Several treatment options, min-mod task modification necessary    Comorbidities Affecting Occupational Performance: May have comorbidities impacting occupational performance    Modification or Assistance to Complete Evaluation  Min-Moderate modification of tasks or assist with assess necessary to complete eval    OT Frequency 2x / week    OT Duration 8 weeks    OT Treatment/Interventions Self-care/ADL training;Therapeutic exercise;Visual/perceptual remediation/compensation;Patient/family education;Neuromuscular education;Building services engineer;Therapeutic activities;Balance training;Manual Therapy;DME and/or AE instruction;Aquatic Therapy;Splinting    Plan  Continue addressing STG and LTGs. Grip Strength. 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 Near syncope 07/02/2019  . Passive smoke exposure 07/02/2019    Junious Dresser MOT, OTR/L 10/12/2019, 3:38 PM  [facility] [address] [place], [place], [address] Phone: [phone]   Fax:  [phone]  Name: Demiah Gullickson MRN: [id number] Date of Birth: 08-23-1965

## 2019-10-12 NOTE — Patient Instructions (Addendum)
SHOULDER: Flexion On Table    Place hands on table, elbows straight. Move hips away from body. Press hands down into table. Hold 2-3_ seconds. _10__ reps per set, __2_ sets per day, __7_ days per week   Copyright  VHI. All rights reserved.

## 2019-10-14 ENCOUNTER — Encounter: Payer: Self-pay | Admitting: Occupational Therapy

## 2019-10-14 ENCOUNTER — Ambulatory Visit: Payer: BC Managed Care – PPO | Admitting: Occupational Therapy

## 2019-10-14 ENCOUNTER — Other Ambulatory Visit: Payer: Self-pay

## 2019-10-14 ENCOUNTER — Ambulatory Visit: Payer: BC Managed Care – PPO

## 2019-10-14 DIAGNOSIS — R2681 Unsteadiness on feet: Secondary | ICD-10-CM

## 2019-10-14 DIAGNOSIS — I69954 Hemiplegia and hemiparesis following unspecified cerebrovascular disease affecting left non-dominant side: Secondary | ICD-10-CM

## 2019-10-14 DIAGNOSIS — M6281 Muscle weakness (generalized): Secondary | ICD-10-CM | POA: Diagnosis not present

## 2019-10-14 DIAGNOSIS — R2689 Other abnormalities of gait and mobility: Secondary | ICD-10-CM

## 2019-10-14 DIAGNOSIS — M25622 Stiffness of left elbow, not elsewhere classified: Secondary | ICD-10-CM

## 2019-10-14 DIAGNOSIS — R41842 Visuospatial deficit: Secondary | ICD-10-CM

## 2019-10-14 NOTE — Patient Instructions (Signed)
Access Code: [email or web address] URL: [email or web address] Date: 10/14/2019 Prepared by: Jethro Bastos  Exercises Sit to Stand with Hands on Knees - 1 x daily - 7 x weekly - 3 sets - 5 reps Supine Bridge - 1 x daily - 7 x weekly - 3 sets - 5 reps Clamshell - 1 x daily - 7 x weekly - 2 sets - 10 reps Supine Heel Slide - 1 x daily - 7 x weekly - 2 sets - 10 reps Walking March with Countertop Support - 1 x daily - 7 x weekly - 3 sets - 10 reps Seated Gastroc Stretch with Strap - 1 x daily - 7 x weekly - 1 sets - 3 reps - 30 hold Supine Lower Trunk Rotation - 1 x daily - 7 x weekly - 1 sets - 10 reps - 30 seconds hold Modified Thomas Stretch - 1 x daily - 7 x weekly - 1 sets - 3 reps - 30 secs - 1 min hold Seated Hamstring Stretch - 1 x daily - 7 x weekly - 1 sets - 3 reps - 30-45 seconds hold Seated March with Resistance - 1 x daily - 7 x weekly - 2 sets - 10 reps Seated Ankle Dorsiflexion with Anchored Resistance - 1 x daily - 7 x weekly - 2 sets - 10 reps Romberg Stance Eyes Closed on Foam Pad - 1 x daily - 7 x weekly - 1 sets - 3 reps - 20-30 seconds hold Romberg Stance on Foam Pad with Head Rotation - 1 x daily - 7 x weekly - 2 sets - 10 reps Romberg Stance with Head Nods on Foam Pad - 1 x daily - 7 x weekly - 2 sets - 10 reps Tandem Stance with Eyes Closed in Corner - 1 x daily - 7 x weekly - 1 sets - 3 reps - 20-25 seconds hold

## 2019-10-14 NOTE — Therapy (Signed)
[facility] [address] [place], [place], [address] Phone: [phone]   Fax:  [phone]  Physical Therapy Treatment  Patient Details  Name: Kimberly [name] MRN: [id number] Date of Birth: July 16, 1965 Referring Provider (PT): Referred by: Edsel Petrin, DO. Followed by Ihor Austin, NP   Encounter Date: 10/14/2019   PT End of Session - 10/14/19 1234    Visit Number 22    Number of Visits 26    Date for PT Re-Evaluation 01/02/20   Updated POC for 6 weeks, Cert for 90 days   Authorization Type BCBS    PT Start Time 1232    PT Stop Time 1314    PT Time Calculation (min) 42 min    Equipment Utilized During Treatment Gait belt    Activity Tolerance Patient tolerated treatment well    Behavior During Therapy WFL for tasks assessed/performed           Past Medical History:  Diagnosis Date  . Fibromyalgia   . IBS (irritable bowel syndrome)     History reviewed. No pertinent surgical history.  There were no vitals filed for this visit.   Subjective Assessment - 10/14/19 1234    Subjective Patient reports got results from ENT, not were what she was wanting to hear. Patient reports that report will be faxed over. Heavy feeling on the L side.    Patient is accompained by: Family member   Husband   Pertinent History Fibromyalgia and IBS    Limitations Walking;Standing;Lifting    How long can you walk comfortably? 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 MRI brain negative for stroke    Patient Stated Goals Be able to go back to "normal", go back to work    Currently in Pain? Yes    Pain Score 3     Pain Location Leg    Pain Orientation Left    Pain Descriptors / Indicators Heaviness    Pain Type Chronic pain                             [facility] Adult PT Treatment/Exercise - 10/14/19 0001        Ambulation/Gait   Ambulation/Gait Yes    Ambulation/Gait Assistance 5: Supervision    Ambulation/Gait Assistance Details completed gait training x 250 ft without AD. Improved step length noted today. Supervision throughout with ambulation.     Ambulation Distance (Feet) 250 Feet    Assistive device None    Gait Pattern Step-through pattern;Decreased arm swing - left;Decreased arm swing - right;Decreased step length - right;Decreased step length - left;Decreased stance time - left;Decreased hip/knee flexion - left;Decreased dorsiflexion - left;Decreased weight shift to left;Poor foot clearance - left    Ambulation Surface Level;Indoor               Balance Exercises - 10/14/19 0001      Balance Exercises: Standing   Standing Eyes Opened Narrow base of support (BOS);Head turns;Foam/compliant surface;Limitations    Standing Eyes Opened Limitations completed in // bars with GENTLE horizontal/vertical head turns as tolerated by patient. completed 2 x 10 reps    Standing Eyes Closed Narrow base of support (BOS);Foam/compliant surface;3 reps;30 secs;Limitations    Standing Eyes Closed Limitations increased sway noted with vision removed and narrow BOS    Tandem Stance Eyes  open;Foam/compliant surface;Intermittent upper extremity support;3 reps;30 secs;Limitations    Tandem Stance Time alternating foot position, increased difficulty noted with LLE posterior. Completed with eyes open 2 x 30 seconds each, progressed to eyes closed 3 x 30 seconds each. Increased difficulty noted with EC.     Rockerboard Anterior/posterior;Lateral;EO;EC;Intermittent UE support;Limitations    Rockerboard Limitations on rockerboard positioned ant/post, completed with eyes open 2 x 1 minute, progressed to eyes closed 3 x 25-30 secs. With board positioned laterally, completed eyes open 2 x 1 minute each, then progressed to eyes closed 3 x 20-25 seconds. Increased difficulty with board lateral and vision removed.      Other Standing Exercises Added balance exercises to HEP and educated patient on completion in a corner within home with a chair placed in front for safety.            Access Code: [email or web address] URL: [email or web address] Date: 10/14/2019 Prepared by: Jethro Bastos  Exercises Sit to Stand with Hands on Knees - 1 x daily - 7 x weekly - 3 sets - 5 reps Supine Bridge - 1 x daily - 7 x weekly - 3 sets - 5 reps Clamshell - 1 x daily - 7 x weekly - 2 sets - 10 reps Supine Heel Slide - 1 x daily - 7 x weekly - 2 sets - 10 reps Walking March with Countertop Support - 1 x daily - 7 x weekly - 3 sets - 10 reps Seated Gastroc Stretch with Strap - 1 x daily - 7 x weekly - 1 sets - 3 reps - 30 hold Supine Lower Trunk Rotation - 1 x daily - 7 x weekly - 1 sets - 10 reps - 30 seconds hold Modified Thomas Stretch - 1 x daily - 7 x weekly - 1 sets - 3 reps - 30 secs - 1 min hold Seated Hamstring Stretch - 1 x daily - 7 x weekly - 1 sets - 3 reps - 30-45 seconds hold Seated March with Resistance - 1 x daily - 7 x weekly - 2 sets - 10 reps Seated Ankle Dorsiflexion with Anchored Resistance - 1 x daily - 7 x weekly - 2 sets - 10 reps Romberg Stance Eyes Closed on Foam Pad - 1 x daily - 7 x weekly - 1 sets - 3 reps - 20-30 seconds hold Romberg Stance on Foam Pad with Head Rotation - 1 x daily - 7 x weekly - 2 sets - 10 reps Romberg Stance with Head Nods on Foam Pad - 1 x daily - 7 x weekly - 2 sets - 10 reps Tandem Stance with Eyes Closed in Corner - 1 x daily - 7 x weekly - 1 sets - 3 reps - 20-25 seconds hold    PT Education - 10/14/19 1300    Education Details Updated HEP    Person(s) Educated Patient;Spouse    Methods Explanation;Demonstration;Handout    Comprehension Returned demonstration;Verbalized understanding            PT Short Term Goals - 10/04/19 1651      PT SHORT TERM GOAL #1   Title Patient will complete TUG w/o AD in </= 15 secs to demonstrate improved functional  mobility (ALL STG DUE: 10/25/19)    Baseline 17.31 secs    Time 3    Period Weeks    Status New    Target Date 10/25/19      PT SHORT TERM GOAL #2   Title Patient will demo ability to  complete 7 sit <> stands with 30 second chair stand test to demo improved functional strength    Baseline 6    Time 3    Period Weeks    Status New             PT Long Term Goals - 10/04/19 1322      PT LONG TERM GOAL #1   Title Pt will be independent with Final HEP and walking program (All LTG's Due: 11/15/2019)    Baseline continue to progress HEP    Time 6    Period Weeks    Status Revised    Target Date 11/15/19      PT LONG TERM GOAL #2   Title Patient will be able to ascend/descend 12 stairs, Mod I with alternating pattern and single rail for improved ability to enter/exit home    Baseline 12 stairs, alternating, single rail, superivsion    Time 6    Period Weeks    Status On-going      PT LONG TERM GOAL #3   Title Patient will demo ability to complete 10 sit <> stands in 30 seconds with UE support to demo improved LE strength/endurance    Baseline 2 sit <> stands, 5 sit <> stands on 7/15, 6 sit <> stands on 8/30    Time 6    Period Weeks    Status On-going      PT LONG TERM GOAL #4   Title Patient will demo ability to ambulate >/= 1000 ft on various surfaces with LRAD to demo improved functional mobility and endurance    Baseline 850 ft on indoor surfaces w/ SPC    Time 6    Period Weeks    Status On-going      PT LONG TERM GOAL #5   Title Patient will improve TUG </= 12 seconds without AD to demo reduced fall risk    Baseline 17.31 secs    Time 6    Period Weeks    Status New      Additional Long Term Goals   Additional Long Term Goals Yes      PT LONG TERM GOAL #6   Title Patient will improve gait speed to >/= 2.3 ft/sec to demonstrate improved functional mobility    Baseline 1.97 ft/sec    Time 6    Period Weeks    Status New                 Plan -  10/14/19 1431    Clinical Impression Statement According to patient and report from ENT, 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 Continued balance activites to target vestibular system as tolerated by patient. Gentle head turns/nods completed into patient acceptable range. Will continue to progress toward goals.    Personal Factors and Comorbidities Comorbidity 2    Comorbidities Fibromyalgia and IBS    Examination-Activity Limitations Bend;Squat;Stairs;Stand;Lift    Examination-Participation Restrictions Cleaning;Community Activity;Driving   Work   Stability/Clinical Decision Making Evolving/Moderate complexity    Rehab Potential Good    PT Frequency 1x / week    PT Duration 6 weeks    PT Treatment/Interventions ADLs/Self Care Home Management;Electrical Stimulation;Moist Heat;Cryotherapy;DME Instruction;Gait training;Stair training;Functional mobility training;Therapeutic activities;Therapeutic exercise;Balance training;Neuromuscular re-education;Patient/family education;Orthotic Fit/Training;Passive range of motion    PT Next Visit Plan How was Balance exercies? SOT?    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 Near syncope 07/02/2019  . Passive smoke exposure  07/02/2019    Tempie DonningKaitlyn B Nataley Bahri, PT, DPT 10/14/2019, 2:33 PM  [facility] [address] [place], [place], [address] Phone: [phone]   Fax:  [phone]  Name: Kimberly [name] MRN: [id number] Date of Birth: 05-11-1965

## 2019-10-14 NOTE — Therapy (Signed)
[facility] [address] [place], [place], [address] Phone: [phone]   Fax:  [phone]  Occupational Therapy Treatment  Patient Details  Name: Kimberly [name] MRN: [id number] Date of Birth: 05/15/1965 No data recorded  Encounter Date: 10/14/2019   OT End of Session - 10/14/19 1149    Visit Number 9    Number of Visits 17    Date for OT Re-Evaluation 11/13/19    Authorization Type BCBS - 60 visits combined PT/OT/ST    Authorization - Visit Number 9    Authorization - Number of Visits 30    OT Start Time 1148    OT Stop Time 1230    OT Time Calculation (min) 42 min    Activity Tolerance Patient tolerated treatment well    Behavior During Therapy WFL for tasks assessed/performed           Past Medical History:  Diagnosis Date  . Fibromyalgia   . IBS (irritable bowel syndrome)     History reviewed. No pertinent surgical history.  There were no vitals filed for this visit.   Subjective Assessment - 10/14/19 1149    Subjective  Pt reports, "Ive been feeling a little bit of heaviness in my arm like I am holding a weight. My foot and my hand want to be stubborn today"    Patient is accompanied by: Family member    Pertinent History verterbral artery dissection 07/01/19    Limitations **no pushing/pulling or lifting, no looking up w/ cervical extension, no extreme head turns    Currently in Pain? Yes    Pain Score 3     Pain Location Arm    Pain Orientation Left    Pain Descriptors / Indicators Tingling    Pain Type Chronic pain    Pain Onset More than a month ago    Pain Frequency Intermittent    Pain Relieving Factors rest             Treatment:  Grip strength assessed in LUE - 19.4 lbs Simulated placing clothes in dryer - mod I. Pt maintained precautions appropriately - no cues required. OT carried laundry basket to maintain precautions of no lifting to table. Patient stood for folding laundry - no  cues required and with mod I.  LUE grooved pegboard - no difficulty   Environmental Scanning while carrying bowl and collecting cards - 12/12 while maintaining precautions of no extreme head turns, or looking up with cervical extension Resistance Clothespins with LUE for grip strengthening and reaching with LUE shoulder. Patient did not have any complaints of pain.  UEB 5 minutes with no resistance with bilateral UE      OT Short Term Goals - 10/06/19 1145      OT SHORT TERM GOAL #1   Title Patient will complete a home exercise program designed to improve shoulder range of motion due 10/14/19    Time 4    Period Weeks    Status Achieved    Target Date 10/14/19      OT SHORT TERM GOAL #2   Title Patient will complete a home exercise program designed to improve hand strength    Time 4    Period Weeks    Status Achieved      OT SHORT TERM GOAL #3   Title Patient will incorporate demonstrate adequate strength and range of motion in left arm to reach low to obtain and replace  lightweight object (less than 1 lb)  off table top while standing.    Time 4    Period Weeks    Status Achieved      OT SHORT TERM GOAL #4   Title Patient willa ctively attend to familiar functional or novel task x 15 minutes in distracting environment without any prompting   Completed novel task/pattern with small pegs with min verbal cues for technique for removing pegs for in hand manipulation.   Time 4    Period Weeks    Status On-going      OT SHORT TERM GOAL #5   Title Patient will utilize BUE to  reach low and show sufficient grasp strength to grasp and pull up and fasten pants.    Time 4    Period Weeks    Status Achieved   pt is performing            OT Long Term Goals - 10/14/19 1155      OT LONG TERM GOAL #1   Title Patient will complete an updated HEP which addresses sactive movement, strength and functional use of LUE    Time 8    Period Weeks    Status On-going   issued HEP of table  exercises for shoulder flexion     OT LONG TERM GOAL #2   Title Patient will demonstrate mid level supported reach with LUE to obtain or put away lightweight dishes at chest height    Time 8    Period Weeks    Status New      OT LONG TERM GOAL #3   Title Patient will transport clothing to laundry and complete laundry with intermittent min assistance    Time 8    Period Weeks    Status On-going   completed light laundry with folding clothes and placing clothes in dryer with mod I. no lifting basket 10/14/19     OT LONG TERM GOAL #4   Title Patient will demonstrate at least 10 lb increase in grip strength to allow her to open bottles, carry items in LUE    Baseline 7.9 lbs    Time 8    Period Weeks    Status Achieved   Pt measured 19.4 lbs in LUE 10/14/19                Plan - 10/14/19 1206    Clinical Impression Statement Patient increasing independence with completing home management tasks with minmal assistance for lifting precautions. Patient demonstrating awareness of precautions.    OT Occupational Profile and History Detailed Assessment- Review of Records and additional review of physical, cognitive, psychosocial history related to current functional performance    Occupational performance deficits (Please refer to evaluation for details): ADL's;IADL's;Work;Leisure    Body Structure / Function / Physical Skills ADL;Coordination;Endurance;GMC;UE functional use;Balance;Decreased knowledge of precautions;Sensation;Vestibular;Vision;Pain;IADL;Body mechanics;Dexterity;FMC;Strength;ROM;Edema    Cognitive Skills Attention;Memory    Rehab Potential Fair    Clinical Decision Making Several treatment options, min-mod task modification necessary    Comorbidities Affecting Occupational Performance: May have comorbidities impacting occupational performance    Modification or Assistance to Complete Evaluation  Min-Moderate modification of tasks or assist with assess necessary to complete  eval    OT Frequency 2x / week    OT Duration 8 weeks    OT Treatment/Interventions Self-care/ADL training;Therapeutic exercise;Visual/perceptual remediation/compensation;Patient/family education;Neuromuscular education;Building services engineer;Therapeutic activities;Balance training;Manual Therapy;DME and/or AE instruction;Aquatic Therapy;Splinting    Plan 10th visit progress note    Consulted and Agree with Plan of Care Patient  Patient will benefit from skilled therapeutic intervention in order to improve the following deficits and impairments:   Body Structure / Function / Physical Skills: ADL, Coordination, Endurance, GMC, UE functional use, Balance, Decreased knowledge of precautions, Sensation, Vestibular, Vision, Pain, IADL, Body mechanics, Dexterity, FMC, Strength, ROM, Edema Cognitive Skills: Attention, Memory     Visit Diagnosis: Visuospatial deficit  Unsteadiness on feet  Muscle weakness (generalized)  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 Near syncope 07/02/2019  . Passive smoke exposure 07/02/2019    Junious Dresser 10/14/2019, 12:42 PM   [facility] [address] [place], [place], [address] Phone: [phone]   Fax:  [phone]  Name: Kimberly [name] MRN: [id number] Date of Birth: 16-Mar-1965

## 2019-10-20 ENCOUNTER — Encounter: Payer: Self-pay | Admitting: Occupational Therapy

## 2019-10-20 ENCOUNTER — Ambulatory Visit: Payer: BC Managed Care – PPO | Admitting: Adult Health

## 2019-10-20 ENCOUNTER — Ambulatory Visit: Payer: BC Managed Care – PPO | Admitting: Occupational Therapy

## 2019-10-20 ENCOUNTER — Telehealth (HOSPITAL_COMMUNITY): Payer: Self-pay | Admitting: Radiology

## 2019-10-20 ENCOUNTER — Encounter: Payer: Self-pay | Admitting: Adult Health

## 2019-10-20 ENCOUNTER — Other Ambulatory Visit: Payer: Self-pay

## 2019-10-20 VITALS — BP 144/86 | HR 72 | Ht 66.0 in | Wt 200.0 lb

## 2019-10-20 DIAGNOSIS — R41842 Visuospatial deficit: Secondary | ICD-10-CM

## 2019-10-20 DIAGNOSIS — I69954 Hemiplegia and hemiparesis following unspecified cerebrovascular disease affecting left non-dominant side: Secondary | ICD-10-CM

## 2019-10-20 DIAGNOSIS — R2681 Unsteadiness on feet: Secondary | ICD-10-CM

## 2019-10-20 DIAGNOSIS — E785 Hyperlipidemia, unspecified: Secondary | ICD-10-CM | POA: Diagnosis not present

## 2019-10-20 DIAGNOSIS — I7774 Dissection of vertebral artery: Secondary | ICD-10-CM | POA: Diagnosis not present

## 2019-10-20 DIAGNOSIS — S0461XS Injury of acoustic nerve, right side, sequela: Secondary | ICD-10-CM

## 2019-10-20 DIAGNOSIS — M6281 Muscle weakness (generalized): Secondary | ICD-10-CM

## 2019-10-20 DIAGNOSIS — M25622 Stiffness of left elbow, not elsewhere classified: Secondary | ICD-10-CM

## 2019-10-20 MED ORDER — CLOPIDOGREL BISULFATE 75 MG PO TABS: 75.0000 mg | ORAL_TABLET | Freq: Every day | ORAL | 0 refills | Status: DC

## 2019-10-20 MED ORDER — TIZANIDINE HCL 2 MG PO CAPS: 2.0000 mg | ORAL_CAPSULE | Freq: Every evening | ORAL | 4 refills | Status: DC

## 2019-10-20 MED ORDER — ATORVASTATIN CALCIUM 80 MG PO TABS: 80.0000 mg | ORAL_TABLET | Freq: Every day | ORAL | 3 refills | Status: DC

## 2019-10-20 NOTE — Patient Instructions (Addendum)
  Coordination Activities  Perform the following activities for 20 minutes 1 times per day with left hand(s).   Rotate ball in fingertips (clockwise and counter-clockwise).  Toss ball in air and catch with the same hand.  Flip cards 1 at a time as fast as you can.  Deal cards with your thumb (Hold deck in hand and push card off top with thumb).  Pick up coins, buttons, marbles, dried beans/pasta of different sizes and place in container.  Pick up coins and place in container or coin bank.  Pick up coins and stack.  Pick up coins one at a time until you get 5-10 in your hand, then move coins from palm to fingertips to stack one at a time.

## 2019-10-20 NOTE — Telephone Encounter (Signed)
Called pt, left VM for her to call to schedule consult w/ Deveshwar for vertebral artery dissection. JM

## 2019-10-20 NOTE — Patient Instructions (Addendum)
Continue working with therapy for ongoing improvement of ongoing symptoms  Referral placed to Dr. Corliss Skains for further evaluation of your vertebral artery   Trial tizanidine 2mg  nightly for stiffness on left side - please call with any difficulty tolerating or possible need of dosage increase  Continue aspirin 81 mg daily and clopidogrel 75 mg daily  and atorvastatin  for secondary stroke prevention  Continue to monitor symptoms of anxiety - please follow with PCP regarding ongoing monitoring and possible need of management  Continue to follow up with PCP regarding cholesterol and blood pressure management  Maintain strict control of hypertension with blood pressure goal below 130/90 and cholesterol with LDL cholesterol (bad cholesterol) goal below 70 mg/dL.      Followup in the future with me in 3 months with Dr.       Thank you for coming to see [name] at [facility]. I hope we have been able to provide you high quality care today.

## 2019-10-20 NOTE — Therapy (Signed)
[facility] [address] [place], [place], [address] Phone: [phone]   Fax:  [phone]  Occupational Therapy Treatment  Patient Details  Name: Kimberly [name] MRN: [id number] Date of Birth: [birth year] No data recorded  Encounter Date: 10/20/2019   OT End of Session - 10/20/19 1108    Visit Number 10    Number of Visits 17    Date for OT Re-Evaluation 11/13/19    Authorization Type [employer] - 60 visits combined PT/OT/ST    Authorization - Visit Number 10    Authorization - Number of Visits 30    OT Start Time 1104    OT Stop Time 1145    OT Time Calculation (min) 41 min    Activity Tolerance Patient tolerated treatment well    Behavior During Therapy WFL for tasks assessed/performed           Past Medical History:  Diagnosis Date  . Fibromyalgia   . IBS (irritable bowel syndrome)     History reviewed. No pertinent surgical history.  There were no vitals filed for this visit.   Subjective Assessment - 10/20/19 1106    Subjective  Pt just got out of her follow up appointment with [facility] at [facility] - reports dissection is not healing like it's supposed to. Otherwise, patient says she is doing well. Patient denies any pain but reports leg is swollen.    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 No/denies               OT Treatments/Exercises (OP) - 10/20/19 1113      Bed Mobility   Bed Mobility Supine to Sit;Sit to Supine    Sit to Supine Independent   Patient requires increased time for vestibular deficits.     Exercises   Exercises Shoulder;Hand      Shoulder Exercises: Supine   Other Supine Exercises using medium size ball - shoulder flexion, protraction/retraction, horiz abduction, forward circumduction x 10       Fine Motor Coordination (Hand/Wrist)   Fine Motor Coordination Manipulation of small objects;Flipping cards;Dealing card with thumb;Picking up coins;Manipulating coins   See pt instructions LUE    Manipulation of small objects Coordination HEP    Flipping cards Coordination HEP    Dealing card with thumb Coordination HEP    Picking up coins Coordination HEP    Manipulating coins Coordination HEP            OT Education - 10/20/19 1138    Education Details issued coordination HEP for LUE - see pt instructions    Person(s) Educated Patient;Spouse    Methods Explanation;Demonstration    Comprehension Verbalized understanding;Returned demonstration            OT Short Term Goals - 10/06/19 1145      OT SHORT TERM GOAL #1   Title Patient will complete a home exercise program designed to improve shoulder range of motion due 10/14/19    Time 4    Period Weeks    Status Achieved    Target Date 10/14/19      OT SHORT TERM GOAL #2   Title Patient will complete a home exercise program designed to improve hand strength    Time 4    Period Weeks    Status Achieved  OT SHORT TERM GOAL #3   Title Patient will incorporate demonstrate adequate strength and range of motion in left arm to reach low to obtain and replace  lightweight object (less than 1 lb) off table top while standing.    Time 4    Period Weeks    Status Achieved      OT SHORT TERM GOAL #4   Title Patient willa ctively attend to familiar functional or novel task x 15 minutes in distracting environment without any prompting   Completed novel task/pattern with small pegs with min verbal cues for technique for removing pegs for in hand manipulation.   Time 4    Period Weeks    Status On-going      OT SHORT TERM GOAL #5   Title Patient will utilize BUE to  reach low and show sufficient grasp strength to grasp and pull up and fasten pants.    Time 4    Period Weeks    Status Achieved   pt is performing             OT Long Term Goals - 10/14/19 1155      OT LONG TERM GOAL #1   Title Patient will complete an updated HEP which addresses sactive movement, strength and functional use of LUE    Time 8    Period Weeks    Status On-going   issued HEP of table exercises for shoulder flexion     OT LONG TERM GOAL #2   Title Patient will demonstrate mid level supported reach with LUE to obtain or put away lightweight dishes at chest height    Time 8    Period Weeks    Status New      OT LONG TERM GOAL #3   Title Patient will transport clothing to laundry and complete laundry with intermittent min assistance    Time 8    Period Weeks    Status On-going   completed light laundry with folding clothes and placing clothes in dryer with mod I. no lifting basket 10/14/19     OT LONG TERM GOAL #4   Title Patient will demonstrate at least 10 lb increase in grip strength to allow her to open bottles, carry items in LUE    Baseline 7.9 lbs    Time 8    Period Weeks    Status Achieved   Pt measured 19.4 lbs in LUE 10/14/19                Plan - 10/20/19 1122    Clinical Impression Statement Patient increasing independence with completing home management tasks with minmal assistance for lifting precautions. Patient demonstrating awareness of precautions.    OT Occupational Profile and History Detailed Assessment- Review of Records and additional review of physical, cognitive, psychosocial history related to current functional performance    Occupational performance deficits (Please refer to evaluation for details): ADL's;IADL's;Work;Leisure    Body Structure / Function / Physical Skills ADL;Coordination;Endurance;GMC;UE functional use;Balance;Decreased knowledge of precautions;Sensation;Vestibular;Vision;Pain;IADL;Body mechanics;Dexterity;FMC;Strength;ROM;Edema    Cognitive Skills Attention;Memory    Rehab Potential Fair    Clinical Decision Making Several treatment options, min-mod task modification  necessary    Comorbidities Affecting Occupational Performance: May have comorbidities impacting occupational performance    Modification or Assistance to Complete Evaluation  Min-Moderate modification of tasks or assist with assess necessary to complete eval    OT Frequency 2x / week    OT Duration 8 weeks    OT Treatment/Interventions  Self-care/ADL training;Therapeutic exercise;Visual/perceptual remediation/compensation;Patient/family education;Neuromuscular education;Building services engineer;Therapeutic activities;Balance training;Manual Therapy;DME and/or AE instruction;Aquatic Therapy;Splinting    Plan fine motor coordination with LUE, arm bike    Consulted and Agree with Plan of Care Patient           Patient will benefit from skilled therapeutic intervention in order to improve the following deficits and impairments:   Body Structure / Function / Physical Skills: ADL, Coordination, Endurance, GMC, UE functional use, Balance, Decreased knowledge of precautions, Sensation, Vestibular, Vision, Pain, IADL, Body mechanics, Dexterity, FMC, Strength, ROM, Edema Cognitive Skills: Attention, Memory     Visit Diagnosis: Visuospatial deficit  Unsteadiness on feet  Muscle weakness (generalized)  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 Near syncope 07/02/2019  . Passive smoke exposure 07/02/2019    Junious Dresser MOT, OTR/L  10/20/2019, 12:07 PM  [facility] [address] [place], [place], [address] Phone: [phone]   Fax:  [phone]  Name: Kimberly [name] MRN: [id number] Date of Birth: [birth year]

## 2019-10-20 NOTE — Progress Notes (Signed)
I agree with the above plan 

## 2019-10-20 NOTE — Progress Notes (Signed)
[facility] [address] [place] [id number] [phone]       STROKE FOLLOW UP NOTE  [name] Date of Birth:  01/13/1966 Medical Record Number:  [id number]   Reason for Referral: stroke follow up    SUBJECTIVE:   CHIEF COMPLAINT:  Chief Complaint  Patient presents with  . Follow-up    3 mo f/u from a stroke. Pt still had the headaches and dizziness.  . treatment room    HPI:   Today, 10/20/2019, Kimberly [name] returns for follow-up regarding R VA dissection in 06/2019 accompanied by her husband  She continues to experience left-sided deficits, vertigo and gait impairment.  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 Noted as finding remains unchanged over time frame a congenital fenestration would be more likely per radiology report  She remains on both aspirin and Plavix without bleeding or bruising Remains on atorvastatin 80 mg daily without myalgias Blood pressure today 144/86  No further concerns at this time    History provided for reference purposes only Initial visit 07/27/2019 JM: She presents today for follow up regarding recent admission for stroke accompanied by her husband.  Since discharge, reports continued left-sided weakness, gait impairment and episodes of dizziness/vertigo worsened with head movement.  She also complains of neck pain and left orbital headache which has been  persistent since hospitalization.  Denies new or worsening stroke/TIA symptoms.  Currently working with PT and recently referred to OT by PCP and is currently waiting to schedule visits.  Ambulates with cane and denies any recent falls.  She has not returned back to work at [employer] and plans on obtaining short-term disability through PCP.  Has remained on DAPT without bleeding or bruising.  Continues on atorvastatin 80 mg daily.  Blood pressure today 127/89.  No further concerns at this time.  Stroke admission Kimberly [name] is a 54 y.o. female with history of fibromyalgia and IBS  who presented on 07/02/2019 with  Vertigo, nausea, vomiting and left sided numbness / weakness.  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  Recommended to initiate DAPT for 3 months.  Recommended 4 to 6-week follow-up imaging in regards to possible dissection.  HTN stable.  LDL 191 initiate atorvastatin 80 mg daily.  Other stroke risk factors include prior EtOH use and obesity but no prior stroke history.  Discharged home in stable condition without therapy needs.  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  No mass or hemorrhage. Mucosal thickening noted in several ethmoid air cells. Probable cerumen in the right external auditory canal.   MRI head - Negative for acute infarct. Mild chronic white matter changes most likely  due to small vessel ischemia.   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 MRI recommended to exclude acute infarct.   CT Perfusion - not ordered  Carotid Doppler -  CTA neck performed - carotid dopplers not indicated.  2D Echo - EF 55-60  Loyal Jacobson Virus 2  - negative  LDL - 191  HgbA1c - 5.3      ROS:   14 system review of systems performed and negative with exception of those listed in HPI  PMH:  Past Medical History:  Diagnosis Date  . Fibromyalgia   . IBS (irritable bowel syndrome)     PSH: No past surgical history on file.  Social History:  Social History   Socioeconomic History  . Marital status: Married    Spouse name: Not on file  . Number of children: Not on file  . Years of education: Not on file  . Highest education level: Not on file  Occupational History  . Not on file  Tobacco Use  . Smoking status: Passive Smoke Exposure - Never Smoker  . Smokeless tobacco: Never Used  Substance and Sexual Activity  . Alcohol use: Not Currently  . Drug use: Never  . Sexual activity: Yes    Partners: Male  Other Topics Concern  . Not on file  Social History Narrative  . Not on file   Social Determinants of Health   Financial Resource Strain:   . Difficulty of Paying Living Expenses: Not on file  Food Insecurity:   . Worried About Programme researcher, broadcasting/film/video in the Last Year: Not on file  . Ran Out of Food in the Last Year: Not on file  Transportation Needs:   . Lack of Transportation (Medical): Not on file  . Lack of Transportation (Non-Medical): Not on file  Physical Activity:   . Days of Exercise per Week: Not on file  . Minutes of Exercise per Session: Not on file  Stress:   . Feeling of Stress : Not on file  Social Connections:   . Frequency of Communication with Friends and Family: Not on file  . Frequency of Social Gatherings with Friends and Family: Not on file  . Attends Religious Services: Not on file  . Active Member of Clubs or Organizations: Not on file  . Attends Banker Meetings: Not on file  . Marital Status: Not on file  Intimate Partner Violence:   . Fear of Current or Ex-Partner: Not  on file  . Emotionally Abused: Not on file  . Physically Abused: Not on file  . Sexually Abused: Not on file    Family History: No family history on file.  Medications:   Current Outpatient Medications on File Prior to Visit  Medication Sig Dispense Refill  . aspirin EC 81 MG EC tablet Take 1 tablet (81 mg total) by mouth daily. 30 tablet 2  . atorvastatin (LIPITOR) 80 MG tablet Take 1 tablet (80 mg total) by mouth daily. 30 tablet 1  . Baclofen 5 MG TABS Take 5 mg by mouth 3 (three) times daily as needed. 90 tablet 2  . clopidogrel (PLAVIX) 75 MG tablet Take 1 tablet (75 mg total) by mouth daily. 30 tablet 2   No current facility-administered medications on file prior to visit.    Allergies:  No Known Allergies    OBJECTIVE:  Physical Exam  Vitals:   10/20/19 0954  BP: (!) 144/86  Pulse: 72  Weight: 200 lb (90.7 kg)  Height: 5\' 6"  (1.676 m)   Body mass index is 32.28 kg/m. No exam data present  General: well developed, well nourished,  pleasant middle-age Caucasian female, seated, in no evident distress Head: head normocephalic and atraumatic.   Neck: supple with no carotid or supraclavicular bruits Cardiovascular: regular rate and rhythm, no murmurs Musculoskeletal: no deformity Skin:  no rash/petichiae Vascular:  Normal pulses all extremities   Neurologic Exam Mental Status: Awake and fully alert.  Fluent speech and language.  Oriented to place and time. Recent and remote memory intact. Attention span, concentration and fund of knowledge appropriate. Mood and affect appropriate.  Cranial Nerves: Pupils equal, briskly reactive to light. Extraocular movements full without nystagmus.   Visual fields full to confrontation. Hearing intact. Facial sensation intact. Face, tongue, palate moves normally and symmetrically.  Motor: Normal bulk and tone.  Full strength right upper and lower extremity.  Potentially mild left-sided weakness with giveaway weakness noted; use of  AFO brace for ambulation  Sensory.:  Hypersensitivity with light touch left upper and lower extremity.  Coordination: Rapid alternating movements normal in all extremities except decreased left hand. Finger-to-nose slowed movement on left but no evidence of ataxia or dysmetria and heel-to-shin performed accurately on right side with difficulty on the left; R>L upper extremity action tremors.  Gait and Station: Arises from chair without difficulty. Stance is normal. Gait demonstrates normal stride length with mild imbalance and use of cane and AFO bace Reflexes: 1+ and symmetric. Toes downgoing.        ASSESSMENT: Kimberly [name] is a 54 y.o. 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  MRI negative for stroke. Vascular risk factors include fibromyalgia, HLD and prior EtOH use.  Dissection likely due to her work environment with frequent extending of neck and lifting heavy objects.    Continues to experience subjective left-sided weakness with hemisensory impairment and increased tone, dizziness/vertigo with head movements and imbalance     PLAN:  1. R VA dissection:  a. Continues to experience left-sided deficits including subjective weakness, hemisensory impairment and tremors as well as intermittent daily episodes of vertigo and imbalance b. Short-term disability provided by PCP c. Repeat CTA showed continued appearance of R VA dissection d. Referral placed to IR for further evaluation e. Continue DAPT until further evaluation with IR f. Continue atorvastatin 80 mg daily for secondary stroke prevention g. Continue neck pain and left upper and lower extremity tremors with activation potentially for mild spasticity.  Unable to tolerate baclofen due to abnormal dreams therefore recommend trial of tizanidine 2 mg  nightly. h. Discussed importance of close PCP follow-up for aggressive stroke risk factor management i. Discussed neck precautions with avoidance of rapid movements or activities that could cause impact.  2. Cranial nerve VIII damage: a. 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 Continue working with therapy for ongoing vertigo symptoms c. Requested ENT evaluation be faxed to office for further review 3. HLD: LDL goal<70.  Continue atorvastatin.  Managed and prescribed by PCP.   Follow up in 3 months or call earlier if needed   I spent 30 minutes of face-to-face and non-face-to-face time with patient and husband.  This included previsit chart review, lab review, study review, order entry, electronic  health record documentation, patient education regarding R VA dissection, residual deficits, cranial nerve damage, importance of managing stroke risk factors and answered all questions to patient and husband satisfaction   Ihor Austin, AGNP-BC  [facility] [address] [place], [place] [address]  Phone [phone] Fax [phone] Note: This document was prepared with digital dictation and possible smart phrase technology. Any transcriptional errors that result from this process are unintentional.

## 2019-10-25 ENCOUNTER — Ambulatory Visit: Payer: BC Managed Care – PPO

## 2019-10-25 ENCOUNTER — Ambulatory Visit: Payer: BC Managed Care – PPO | Admitting: Adult Health

## 2019-10-25 ENCOUNTER — Encounter: Payer: Self-pay | Admitting: Occupational Therapy

## 2019-10-25 ENCOUNTER — Ambulatory Visit: Payer: BC Managed Care – PPO | Admitting: Occupational Therapy

## 2019-10-25 ENCOUNTER — Other Ambulatory Visit: Payer: Self-pay

## 2019-10-25 DIAGNOSIS — M6281 Muscle weakness (generalized): Secondary | ICD-10-CM | POA: Diagnosis not present

## 2019-10-25 DIAGNOSIS — R2689 Other abnormalities of gait and mobility: Secondary | ICD-10-CM

## 2019-10-25 DIAGNOSIS — I69954 Hemiplegia and hemiparesis following unspecified cerebrovascular disease affecting left non-dominant side: Secondary | ICD-10-CM

## 2019-10-25 DIAGNOSIS — M25622 Stiffness of left elbow, not elsewhere classified: Secondary | ICD-10-CM

## 2019-10-25 DIAGNOSIS — R41842 Visuospatial deficit: Secondary | ICD-10-CM

## 2019-10-25 DIAGNOSIS — R2681 Unsteadiness on feet: Secondary | ICD-10-CM

## 2019-10-25 NOTE — Therapy (Signed)
[facility] [address] [place], [place], [address] Phone: [phone]   Fax:  [phone]  Physical Therapy Treatment  Patient Details  Name: Kimberly [name] MRN: [id number] Date of Birth: Nov 02, 1965 Referring Provider (PT): Referred by: Edsel Petrin, DO. Followed by Ihor Austin, NP   Encounter Date: 10/25/2019   PT End of Session - 10/25/19 1318    Visit Number 23    Number of Visits 26    Date for PT Re-Evaluation 01/02/20   Updated POC for 6 weeks, Cert for 90 days   Authorization Type BCBS    PT Start Time 1316    PT Stop Time 1359    PT Time Calculation (min) 43 min    Equipment Utilized During Treatment Gait belt    Activity Tolerance Patient tolerated treatment well    Behavior During Therapy WFL for tasks assessed/performed           Past Medical History:  Diagnosis Date  . Fibromyalgia   . IBS (irritable bowel syndrome)     No past surgical history on file.  There were no vitals filed for this visit.   Subjective Assessment - 10/25/19 1319    Subjective Patient reports that visit with [facility] went well. No other new changes/complaints. No falls, few stumbles.    Patient is accompained by: Family member   Husband   Pertinent History Fibromyalgia and IBS    Limitations Walking;Standing;Lifting    How long can you walk comfortably? 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 MRI brain negative for stroke    Patient Stated Goals Be able to go back to "normal", go back to work    Currently in Pain? Yes    Pain Score 5     Pain Location Leg    Pain Orientation Left    Pain Descriptors / Indicators Aching;Sore    Pain Type Chronic pain              Neuro re-ed: sensory organization test performed with following results: Conditions: 1: 2 Above Norm, 1 Below Norm 2: 1 Above Norm, 2 Below  Norm 3: 2 Above Norm, 1 Below Norm  4: All 3 Above Norm 5: All 3 Above Norm 6: All 3 Above Norm Composite score: 78% (above age related norm)  Sensory Analysis Som: Above age related norm Vis: Above age related norm Vest: Above age related norm Pref: Above age related norm (100%)  Strategy analysis: patient utilize hip/ankle strategy appropriately    COG alignment: patient demo slight posterior alignment        PT educated patient and husband on SOT testing findings.  [facility] Adult PT Treatment/Exercise - 10/25/19 1452      Ambulation/Gait   Ambulation/Gait Yes    Ambulation/Gait Assistance 5: Supervision    Ambulation/Gait Assistance Details throughout therapy gym with activites without AD    Assistive device None    Gait Pattern Step-through pattern;Decreased arm swing - left;Decreased arm swing - right;Decreased step length - right;Decreased step length - left;Decreased stance time - left;Decreased hip/knee flexion - left;Decreased dorsiflexion - left;Decreased weight shift to left;Poor foot clearance - left    Ambulation Surface Level;Indoor               Balance Exercises - 10/25/19 0001      Balance Exercises: Standing   Rockerboard Anterior/posterior;EO;EC;30 seconds;Limitations  Rockerboard Limitations completed standing on rockerboard with eyes open x 2 minutes, progressed to completing with eyes closed 3 x 30 seconds. Increased balance challenge noted with vision removed.                PT Short Term Goals - 10/04/19 1651      PT SHORT TERM GOAL #1   Title Patient will complete TUG w/o AD in </= 15 secs to demonstrate improved functional mobility (ALL STG DUE: 10/25/19)    Baseline 17.31 secs    Time 3    Period Weeks    Status New    Target Date 10/25/19      PT SHORT TERM GOAL #2   Title Patient will demo ability to complete 7 sit <> stands with 30 second chair stand test to demo improved functional strength    Baseline 6    Time 3    Period Weeks     Status New             PT Long Term Goals - 10/04/19 1322      PT LONG TERM GOAL #1   Title Pt will be independent with Final HEP and walking program (All LTG's Due: 11/15/2019)    Baseline continue to progress HEP    Time 6    Period Weeks    Status Revised    Target Date 11/15/19      PT LONG TERM GOAL #2   Title Patient will be able to ascend/descend 12 stairs, Mod I with alternating pattern and single rail for improved ability to enter/exit home    Baseline 12 stairs, alternating, single rail, superivsion    Time 6    Period Weeks    Status On-going      PT LONG TERM GOAL #3   Title Patient will demo ability to complete 10 sit <> stands in 30 seconds with UE support to demo improved LE strength/endurance    Baseline 2 sit <> stands, 5 sit <> stands on 7/15, 6 sit <> stands on 8/30    Time 6    Period Weeks    Status On-going      PT LONG TERM GOAL #4   Title Patient will demo ability to ambulate >/= 1000 ft on various surfaces with LRAD to demo improved functional mobility and endurance    Baseline 850 ft on indoor surfaces w/ SPC    Time 6    Period Weeks    Status On-going      PT LONG TERM GOAL #5   Title Patient will improve TUG </= 12 seconds without AD to demo reduced fall risk    Baseline 17.31 secs    Time 6    Period Weeks    Status New      Additional Long Term Goals   Additional Long Term Goals Yes      PT LONG TERM GOAL #6   Title Patient will improve gait speed to >/= 2.3 ft/sec to demonstrate improved functional mobility    Baseline 1.97 ft/sec    Time 6    Period Weeks    Status New                 Plan - 10/25/19 1450    Clinical Impression Statement Today's skilled PT session included completion of Sensory Organization Test. Patient demonstrated above age related norms for all systems, and composite score of 78%. Continued balance activities as tolerated by patient. will continue  to progress toward all goals.    Personal  Factors and Comorbidities Comorbidity 2    Comorbidities Fibromyalgia and IBS    Examination-Activity Limitations Bend;Squat;Stairs;Stand;Lift    Examination-Participation Restrictions Cleaning;Community Activity;Driving   Work   Stability/Clinical Decision Making Evolving/Moderate complexity    Rehab Potential Good    PT Frequency 1x / week    PT Duration 6 weeks    PT Treatment/Interventions ADLs/Self Care Home Management;Electrical Stimulation;Moist Heat;Cryotherapy;DME Instruction;Gait training;Stair training;Functional mobility training;Therapeutic activities;Therapeutic exercise;Balance training;Neuromuscular re-education;Patient/family education;Orthotic Fit/Training;Passive range of motion    PT Next Visit Plan Continue balance exercises, focus on vestibular system.    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 Near syncope 07/02/2019  . Passive smoke exposure 07/02/2019    Tempie Donning, PT, DPT 10/25/2019, 2:53 PM  [facility] [address] [place], [place], [address] Phone: [phone]   Fax:  [phone]  Name: Kimberly [name] MRN: [id number] Date of Birth: Oct 17, 1965

## 2019-10-25 NOTE — Therapy (Signed)
[facility] [address] [place], [place], [address] Phone: [phone]   Fax:  [phone]  Occupational Therapy Treatment  Patient Details  Name: Kimberly [name] MRN: [id number] Date of Birth: [birth year] No data recorded  Encounter Date: 10/25/2019   OT End of Session - 10/25/19 1233    Visit Number 11    Number of Visits 17    Date for OT Re-Evaluation 11/13/19    Authorization Type BCBS - 55 visits combined PT/OT/ST    Authorization - Visit Number 11    Authorization - Number of Visits 30    OT Start Time 1233    OT Stop Time 1315    OT Time Calculation (min) 42 min    Activity Tolerance Patient tolerated treatment well    Behavior During Therapy WFL for tasks assessed/performed           Past Medical History:  Diagnosis Date  . Fibromyalgia   . IBS (irritable bowel syndrome)     History reviewed. No pertinent surgical history.  There were no vitals filed for this visit.   Subjective Assessment - 10/25/19 1234    Subjective  "I am helping out a lot more at home and trying to do a lot more". Pt reports she went by her job.    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 Yes    Pain Score 5     Pain Location Arm    Pain Orientation Left    Pain Descriptors / Indicators Aching;Dull    Pain Type Chronic pain    Pain Onset More than a month ago    Pain Frequency Intermittent    Aggravating Factors  weather coming through    Pain Relieving Factors resting                        OT Treatments/Exercises (OP) - 10/25/19 0001      ADLs   Home Maintenance pt completed laundry with min A with assistance for carrying laundry basket at this time. Pt does cooking and dishes at home with supervisoin - mod I. Pt  reports wanting to get back to cake decorating and cross stitching . pt unloaded dishwasher in ADL kitchen with emphasis on functional reach with LUE. Patient demonstrated reaching high level/overhead with LUE with lightweight cup with no pain.      Exercises   Exercises Work Hardening      Shoulder Exercises: Standing   Other Standing Exercises UE ranger with LUE x 10 shoulder flexion and shoulder flexion + step.      Work Leisure centre manager   UBE (Upper Arm Bike) 7 minutes      Functional Reaching Activities   High Level unloading dishwasher with LUE for lightweight objects - no pain      Fine Motor Coordination (Hand/Wrist)   Fine Motor Coordination In hand manipuation training;Manipulation of small objects    In Hand Manipulation Training reoving small beads x 5 with transliation 1 at a time to drop and sort in container with min drops    Manipulation of small objects no difficulty with bimanual and manipulation of tinker toys, small beads on golf tees x 6 beads on approx 10 golf tees with LUE  OT Education - 10/25/19 1253    Education Details encouraged patient to trial cake decorating and cross stitching (old hobbies)    Person(s) Educated Patient;Spouse    Methods Explanation;Demonstration    Comprehension Verbalized understanding;Returned demonstration            OT Short Term Goals - 10/25/19 1243      OT SHORT TERM GOAL #1   Title Patient will complete a home exercise program designed to improve shoulder range of motion due 10/14/19    Time 4    Period Weeks    Status Achieved    Target Date 10/14/19      OT SHORT TERM GOAL #2   Title Patient will complete a home exercise program designed to improve hand strength    Time 4    Period Weeks    Status Achieved      OT SHORT TERM GOAL #3   Title Patient will incorporate demonstrate adequate strength and range of motion in left arm to reach low to obtain and replace  lightweight object (less  than 1 lb) off table top while standing.    Time 4    Period Weeks    Status Achieved      OT SHORT TERM GOAL #4   Title Patient willa ctively attend to familiar functional or novel task x 15 minutes in distracting environment without any prompting   Completed novel task/pattern with small pegs with min verbal cues for technique for removing pegs for in hand manipulation.   Time 4    Period Weeks    Status Achieved      OT SHORT TERM GOAL #5   Title Patient will utilize BUE to  reach low and show sufficient grasp strength to grasp and pull up and fasten pants.    Time 4    Period Weeks    Status Achieved   pt is performing            OT Long Term Goals - 10/25/19 1309      OT LONG TERM GOAL #1   Title Patient will complete an updated HEP which addresses sactive movement, strength and functional use of LUE    Time 8    Period Weeks    Status On-going   issued HEP of table exercises for shoulder flexion     OT LONG TERM GOAL #2   Title Patient will demonstrate mid level supported reach with LUE to obtain or put away lightweight dishes at chest height    Time 8    Period Weeks    Status Achieved      OT LONG TERM GOAL #3   Title Patient will transport clothing to laundry and complete laundry with intermittent min assistance    Time 8    Period Weeks    Status Achieved   completed light laundry with folding clothes and placing clothes in dryer with mod I. no lifting basket 10/14/19     OT LONG TERM GOAL #4   Title Patient will demonstrate at least 10 lb increase in grip strength to allow her to open bottles, carry items in LUE    Baseline 7.9 lbs    Time 8    Period Weeks    Status Achieved   Pt measured 19.4 lbs in LUE 10/14/19                Plan - 10/25/19 1313    Clinical Impression Statement Patient increasing independence with  completing home management tasks with minmal assistance for lifting precautions. Patint continues to improve and report minimal pain.  Patient has met all STGs and working towards all LTGs    OT Occupational Profile and History Detailed Assessment- Review of Records and additional review of physical, cognitive, psychosocial history related to current functional performance    Occupational performance deficits (Please refer to evaluation for details): ADL's;IADL's;Work;Leisure    Body Structure / Function / Physical Skills ADL;Coordination;Endurance;GMC;UE functional use;Balance;Decreased knowledge of precautions;Sensation;Vestibular;Vision;Pain;IADL;Body mechanics;Dexterity;FMC;Strength;ROM;Edema    Cognitive Skills Attention;Memory    Rehab Potential Fair    Clinical Decision Making Several treatment options, min-mod task modification necessary    Comorbidities Affecting Occupational Performance: May have comorbidities impacting occupational performance    Modification or Assistance to Complete Evaluation  Min-Moderate modification of tasks or assist with assess necessary to complete eval    OT Frequency 2x / week    OT Duration 8 weeks    OT Treatment/Interventions Self-care/ADL training;Therapeutic exercise;Visual/perceptual remediation/compensation;Patient/family education;Neuromuscular education;Therapist, nutritional;Therapeutic activities;Balance training;Manual Therapy;DME and/or AE instruction;Aquatic Therapy;Splinting    Plan Shoulder ROM in supine, prep for discharge?    Consulted and Agree with Plan of Care Patient           Patient will benefit from skilled therapeutic intervention in order to improve the following deficits and impairments:   Body Structure / Function / Physical Skills: ADL, Coordination, Endurance, GMC, UE functional use, Balance, Decreased knowledge of precautions, Sensation, Vestibular, Vision, Pain, IADL, Body mechanics, Dexterity, FMC, Strength, ROM, Edema Cognitive Skills: Attention, Memory     Visit Diagnosis: Other abnormalities of gait and mobility  Stiffness of left elbow,  not elsewhere classified  Muscle weakness (generalized)  Visuospatial deficit    Problem List Patient Active Problem List   Diagnosis Date Noted  . Vertebral artery dissection ([place]) 07/02/2019  . Near syncope 07/02/2019  . Passive smoke exposure 07/02/2019    Zachery Conch  MOT, OTR/L  10/25/2019, 1:16 PM  [facility] [address] [place], [place], [address] Phone: [phone]   Fax:  [phone]  Name: Kimberly [name] MRN: [id number] Date of Birth: [birth year]

## 2019-11-01 ENCOUNTER — Ambulatory Visit: Payer: BC Managed Care – PPO | Admitting: Occupational Therapy

## 2019-11-01 ENCOUNTER — Ambulatory Visit: Payer: BC Managed Care – PPO

## 2019-11-01 ENCOUNTER — Other Ambulatory Visit: Payer: Self-pay

## 2019-11-01 ENCOUNTER — Encounter: Payer: Self-pay | Admitting: Occupational Therapy

## 2019-11-01 DIAGNOSIS — I69954 Hemiplegia and hemiparesis following unspecified cerebrovascular disease affecting left non-dominant side: Secondary | ICD-10-CM

## 2019-11-01 DIAGNOSIS — M6281 Muscle weakness (generalized): Secondary | ICD-10-CM

## 2019-11-01 DIAGNOSIS — R2689 Other abnormalities of gait and mobility: Secondary | ICD-10-CM

## 2019-11-01 DIAGNOSIS — R2681 Unsteadiness on feet: Secondary | ICD-10-CM

## 2019-11-01 DIAGNOSIS — M25622 Stiffness of left elbow, not elsewhere classified: Secondary | ICD-10-CM

## 2019-11-01 NOTE — Therapy (Signed)
[facility] [address] [place], [place], [address] Phone: [phone]   Fax:  [phone]  Occupational Therapy Treatment  Patient Details  Name: Kimberly [name] MRN: [id number] Date of Birth: 04/12/1965 No data recorded  Encounter Date: 11/01/2019   OT End of Session - 11/01/19 1346    Visit Number 12    Number of Visits 17    Date for OT Re-Evaluation 11/13/19    Authorization Type BCBS - 3 visits combined PT/OT/ST    Authorization - Visit Number 12    Authorization - Number of Visits 30    OT Start Time 7124    OT Stop Time 1341    OT Time Calculation (min) 24 min    Activity Tolerance Patient tolerated treatment well    Behavior During Therapy [facility] for tasks assessed/performed           Past Medical History:  Diagnosis Date  . Fibromyalgia   . IBS (irritable bowel syndrome)     History reviewed. No pertinent surgical history.  There were no vitals filed for this visit.   Subjective Assessment - 11/01/19 1320    Subjective  "Feeling tired and a little exhausted today" Pt reports cleaning yesterday and being busy over the weekend.    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 Yes    Pain Score 6     Pain Location Arm    Pain Orientation Left;Distal    Pain Descriptors / Indicators Aching    Pain Type Chronic pain    Pain Onset More than a month ago    Pain Frequency Intermittent    Pain Relieving Factors rest           Treatment: Reviewed HEP in preparation for discharge.        OT Education - 11/01/19 1344    Education Details Education provided on discharge plan. Patient agreeable.    Person(s) Educated Patient;Spouse    Methods Explanation    Comprehension Verbalized understanding            OT  Short Term Goals - 10/25/19 1243      OT SHORT TERM GOAL #1   Title Patient will complete a home exercise program designed to improve shoulder range of motion due 10/14/19    Time 4    Period Weeks    Status Achieved    Target Date 10/14/19      OT SHORT TERM GOAL #2   Title Patient will complete a home exercise program designed to improve hand strength    Time 4    Period Weeks    Status Achieved      OT SHORT TERM GOAL #3   Title Patient will incorporate demonstrate adequate strength and range of motion in left arm to reach low to obtain and replace  lightweight object (less than 1 lb) off table top while standing.    Time 4    Period Weeks    Status Achieved      OT SHORT TERM GOAL #4   Title Patient willa ctively attend to familiar functional or novel task x 15 minutes in distracting environment without any prompting   Completed novel task/pattern with small pegs with min verbal cues for technique for removing pegs for in hand manipulation.  Time 4    Period Weeks    Status Achieved      OT SHORT TERM GOAL #5   Title Patient will utilize BUE to  reach low and show sufficient grasp strength to grasp and pull up and fasten pants.    Time 4    Period Weeks    Status Achieved   pt is performing            OT Long Term Goals - 11/01/19 1337      OT LONG TERM GOAL #1   Title Patient will complete an updated HEP which addresses sactive movement, strength and functional use of LUE    Time 8    Period Weeks    Status Achieved   demonstrated HEP with mod I 11/01/19     OT LONG TERM GOAL #2   Title Patient will demonstrate mid level supported reach with LUE to obtain or put away lightweight dishes at chest height    Time 8    Period Weeks    Status Achieved      OT LONG TERM GOAL #3   Title Patient will transport clothing to laundry and complete laundry with intermittent min assistance    Time 8    Period Weeks    Status Achieved   completed light laundry with folding  clothes and placing clothes in dryer with mod I. no lifting basket 10/14/19     OT LONG TERM GOAL #4   Title Patient will demonstrate at least 10 lb increase in grip strength to allow her to open bottles, carry items in LUE    Baseline 7.9 lbs    Time 8    Period Weeks    Status Achieved   Pt measured 19.4 lbs in LUE 10/14/19                Plan - 11/01/19 1344    Clinical Impression Statement Patient has met all of her STG and LTGs. Patient is ready for discharge from occupational therapy. Pt has improved in the areas of LUE shoulder range of motion, grip strength and fine motor coordination. Patient is now more independent in the areas of ADLs and IADLs. Patient is limited by her precautions currently which have impeded anymore progress.    OT Occupational Profile and History Detailed Assessment- Review of Records and additional review of physical, cognitive, psychosocial history related to current functional performance    Occupational performance deficits (Please refer to evaluation for details): ADL's;IADL's;Work;Leisure    Body Structure / Function / Physical Skills ADL;Coordination;Endurance;GMC;UE functional use;Balance;Decreased knowledge of precautions;Sensation;Vestibular;Vision;Pain;IADL;Body mechanics;Dexterity;FMC;Strength;ROM;Edema    Cognitive Skills Attention;Memory    Rehab Potential Fair    Clinical Decision Making Several treatment options, min-mod task modification necessary    Comorbidities Affecting Occupational Performance: May have comorbidities impacting occupational performance    Modification or Assistance to Complete Evaluation  Min-Moderate modification of tasks or assist with assess necessary to complete eval    OT Frequency 2x / week    OT Duration 8 weeks    OT Treatment/Interventions Self-care/ADL training;Therapeutic exercise;Visual/perceptual remediation/compensation;Patient/family education;Neuromuscular education;Therapist, nutritional;Therapeutic  activities;Balance training;Manual Therapy;DME and/or AE instruction;Aquatic Therapy;Splinting    Consulted and Agree with Plan of Care Patient           OCCUPATIONAL THERAPY DISCHARGE SUMMARY  Visits from Start of Care: 12  Current functional level related to goals / functional outcomes: Mod I    Remaining deficits: Limited by strength and precautions   Education /  Equipment: Pt Mod I with HEP for shoulder flexion and range of motion Plan: Patient agrees to discharge.  Patient goals were met. Patient is being discharged due to meeting the stated rehab goals.  ?????        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 Vertebral artery dissection ([place]) 07/02/2019  . Near syncope 07/02/2019  . Passive smoke exposure 07/02/2019    Zachery Conch MOT, OTR/L  11/01/2019, 3:33 PM  [facility] [address] [place], [place], [address] Phone: [phone]   Fax:  [phone]  Name: Zalika Tieszen MRN: [id number] Date of Birth: June 03, 1965

## 2019-11-01 NOTE — Therapy (Signed)
[facility] [address] [place], [place], [address] Phone: [phone]   Fax:  [phone]  Physical Therapy Treatment  Patient Details  Name: Kimberly [name] MRN: [id number] Date of Birth: 1965-09-11 Referring Provider (PT): Referred by: Edsel Petrin, DO. Followed by Ihor Austin, NP   Encounter Date: 11/01/2019   PT End of Session - 11/01/19 1405    Visit Number 24    Number of Visits 26    Date for PT Re-Evaluation 01/02/20   Updated POC for 6 weeks, Cert for 90 days   Authorization Type BCBS    PT Start Time 1400    PT Stop Time 1443    PT Time Calculation (min) 43 min    Equipment Utilized During Treatment Gait belt    Activity Tolerance Patient tolerated treatment well    Behavior During Therapy WFL for tasks assessed/performed           Past Medical History:  Diagnosis Date  . Fibromyalgia   . IBS (irritable bowel syndrome)     No past surgical history on file.  There were no vitals filed for this visit.   Subjective Assessment - 11/01/19 1403    Subjective Patient reports increased vertigo today. Reports has been busy. Discharged from OT today.    Patient is accompained by: Family member   Husband   Pertinent History Fibromyalgia and IBS    Limitations Walking;Standing;Lifting    How long can you walk comfortably? 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 MRI brain negative for stroke    Patient Stated Goals Be able to go back to "normal", go back to work    Currently in Pain? Yes    Pain Score 7     Pain Location Leg    Pain Orientation Left    Pain Descriptors / Indicators Aching    Pain Type Chronic pain                             [facility] Adult PT Treatment/Exercise - 11/01/19 0001      Transfers   Transfers Sit to Stand;Stand to Sit    Sit to Stand 6:  Modified independent (Device/Increase time)    Five time sit to stand comments  30 second chair stand test: able to complete 7 sit <> stands w/ UE support    Stand to Sit 6: Modified independent (Device/Increase time)      Ambulation/Gait   Ambulation/Gait Yes    Ambulation/Gait Assistance 5: Supervision    Ambulation/Gait Assistance Details completed gait training without AD x 345 ft. Patient demonstrated improved step length and gait speed with completion. No instances of imbalance, superivison.     Ambulation Distance (Feet) 345 Feet    Assistive device None    Gait Pattern Step-through pattern;Decreased arm swing - left;Decreased arm swing - right;Decreased step length - right;Decreased step length - left;Decreased stance time - left;Decreased hip/knee flexion - left;Decreased dorsiflexion - left;Decreased weight shift to left;Poor foot clearance - left    Ambulation Surface Level;Indoor      Timed Up and Go Test   TUG Normal TUG    Normal TUG (seconds) 13.19   w/o AD          Vestibular Treatment/Exercise - 11/01/19 0001      Vestibular Treatment/Exercise  Vestibular Treatment Provided Gaze    Gaze Exercises X1 Viewing Horizontal;X1 Viewing Vertical      X1 Viewing Horizontal   Foot Position seated without back support    Reps 2    Comments x 45 seconds. increased symptoms reported, rated dizziness 6/10. symptoms able to resolve with rest break. Patient demo difficulty with left gaze      X1 Viewing Vertical   Foot Position seated without back support    Reps 1    Comments x 60 seconds. no symptoms reports              Balance Exercises - 11/01/19 0001      Balance Exercises: Standing   Standing Eyes Opened Narrow base of support (BOS);Head turns;Foam/compliant surface;Limitations    Standing Eyes Opened Limitations completed GENTLE horizontal/vertical head turns as tolerated by patient. completed 2 x 10 reps    Rockerboard Anterior/posterior;EO;EC;Limitations     Rockerboard Limitations completed standing on rockerboard with eyes open 2 x 1 minute each, progressed to EC 2 x 45 seconds each. CGA required, no UE support.     Other Standing Exercises completed alternating marching x 15 reps with eyes open and no UE support on upward incline.  CGA required with completion.              PT Education - 11/01/19 1404    Education Details progress toward STG's    Person(s) Educated Patient;Spouse    Methods Explanation    Comprehension Verbalized understanding            PT Short Term Goals - 11/01/19 1409      PT SHORT TERM GOAL #1   Title Patient will complete TUG w/o AD in </= 15 secs to demonstrate improved functional mobility (ALL STG DUE: 10/25/19)    Baseline 17.31 secs, 13.19 secs    Time 3    Period Weeks    Status Achieved    Target Date 10/25/19      PT SHORT TERM GOAL #2   Title Patient will demo ability to complete 7 sit <> stands with 30 second chair stand test to demo improved functional strength    Baseline 7 sit <> stands w/ UE support    Time 3    Period Weeks    Status Achieved             PT Long Term Goals - 10/04/19 1322      PT LONG TERM GOAL #1   Title Pt will be independent with Final HEP and walking program (All LTG's Due: 11/15/2019)    Baseline continue to progress HEP    Time 6    Period Weeks    Status Revised    Target Date 11/15/19      PT LONG TERM GOAL #2   Title Patient will be able to ascend/descend 12 stairs, Mod I with alternating pattern and single rail for improved ability to enter/exit home    Baseline 12 stairs, alternating, single rail, superivsion    Time 6    Period Weeks    Status On-going      PT LONG TERM GOAL #3   Title Patient will demo ability to complete 10 sit <> stands in 30 seconds with UE support to demo improved LE strength/endurance    Baseline 2 sit <> stands, 5 sit <> stands on 7/15, 6 sit <> stands on 8/30    Time 6    Period Weeks    Status  On-going      PT  LONG TERM GOAL #4   Title Patient will demo ability to ambulate >/= 1000 ft on various surfaces with LRAD to demo improved functional mobility and endurance    Baseline 850 ft on indoor surfaces w/ SPC    Time 6    Period Weeks    Status On-going      PT LONG TERM GOAL #5   Title Patient will improve TUG </= 12 seconds without AD to demo reduced fall risk    Baseline 17.31 secs    Time 6    Period Weeks    Status New      Additional Long Term Goals   Additional Long Term Goals Yes      PT LONG TERM GOAL #6   Title Patient will improve gait speed to >/= 2.3 ft/sec to demonstrate improved functional mobility    Baseline 1.97 ft/sec    Time 6    Period Weeks    Status New                 Plan - 11/01/19 1444    Clinical Impression Statement Today's skilled PT session included assessment of STG's, patient able to meet all STG demonstrated improved functional mobility and reduced risk for falls.  Continued vestibular and balance exercises working to promote improved vestibular input. Patient tolerating well, requiring intermittent rest breaks as needed due to fatigue. Patient continues to demo increased balance challenge with vision removed. Will continue to progress toward all goals.    Personal Factors and Comorbidities Comorbidity 2    Comorbidities Fibromyalgia and IBS    Examination-Activity Limitations Bend;Squat;Stairs;Stand;Lift    Examination-Participation Restrictions Cleaning;Community Activity;Driving   Work   Stability/Clinical Decision Making Evolving/Moderate complexity    Rehab Potential Good    PT Frequency 1x / week    PT Duration 6 weeks    PT Treatment/Interventions ADLs/Self Care Home Management;Electrical Stimulation;Moist Heat;Cryotherapy;DME Instruction;Gait training;Stair training;Functional mobility training;Therapeutic activities;Therapeutic exercise;Balance training;Neuromuscular re-education;Patient/family education;Orthotic Fit/Training;Passive  range of motion    PT Next Visit Plan Check LTG + Recert    Consulted and Agree with Plan of Care Patient;Family member/caregiver    Family Member Consulted Husband           Patient will benefit from skilled therapeutic intervention in order to improve the following deficits and impairments:  Abnormal gait, Decreased coordination, Difficulty walking, Decreased endurance, Decreased activity tolerance, Pain, Decreased balance, Decreased mobility, Decreased strength, Impaired sensation  Visit Diagnosis: Muscle weakness (generalized)  Unsteadiness on feet  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 Near syncope 07/02/2019  . Passive smoke exposure 07/02/2019    Tempie Donning, PT, DPT 11/01/2019, 2:45 PM  [facility] [address] [place], [place], [address] Phone: [phone]   Fax:  [phone]  Name: Kimberly [name] MRN: [id number] Date of Birth: Jan 06, 1966

## 2019-11-08 ENCOUNTER — Ambulatory Visit: Payer: BC Managed Care – PPO | Attending: Internal Medicine

## 2019-11-08 ENCOUNTER — Other Ambulatory Visit: Payer: Self-pay

## 2019-11-08 ENCOUNTER — Ambulatory Visit: Payer: BC Managed Care – PPO | Admitting: Occupational Therapy

## 2019-11-08 DIAGNOSIS — M6281 Muscle weakness (generalized): Secondary | ICD-10-CM | POA: Insufficient documentation

## 2019-11-08 DIAGNOSIS — R2681 Unsteadiness on feet: Secondary | ICD-10-CM | POA: Diagnosis present

## 2019-11-08 DIAGNOSIS — I69954 Hemiplegia and hemiparesis following unspecified cerebrovascular disease affecting left non-dominant side: Secondary | ICD-10-CM | POA: Diagnosis present

## 2019-11-08 DIAGNOSIS — R2689 Other abnormalities of gait and mobility: Secondary | ICD-10-CM | POA: Diagnosis present

## 2019-11-08 NOTE — Therapy (Signed)
[facility] [address] [place], [place], [address] Phone: [phone]   Fax:  [phone]  Physical Therapy Treatment/Discharge Summary  Patient Details  Name: Kimberly [name] MRN: [id number] Date of Birth: [birth year] Referring Provider (PT): Referred by: Cristal Ford, DO. Followed by Frann Rider, NP  PHYSICAL THERAPY DISCHARGE SUMMARY  Visits from Start of Care: 25  Current functional level related to goals / functional outcomes: See Clinical Impression Statement for Details.   Remaining deficits: Vertigo, Decreased Endurance   Education / Equipment: Educated on [employer] and walking program   Plan: Patient agrees to discharge.  Patient goals were partially met. Patient is being discharged due to meeting the stated rehab goals.  ?????         Encounter Date: 11/08/2019   PT End of Session - 11/08/19 1443    Visit Number 25    Number of Visits 26    Date for PT Re-Evaluation 01/02/20   Updated POC for 6 weeks, Cert for 90 days   Authorization Type BCBS    PT Start Time 1406   pt arriving late   PT Stop Time 1445    PT Time Calculation (min) 39 min    Equipment Utilized During Treatment Gait belt    Activity Tolerance Patient tolerated treatment well    Behavior During Therapy WFL for tasks assessed/performed           Past Medical History:  Diagnosis Date  . Fibromyalgia   . IBS (irritable bowel syndrome)     History reviewed. No pertinent surgical history.  There were no vitals filed for this visit.   Subjective Assessment - 11/08/19 1411    Subjective Patient reports doing well since last visit, patient reports has vertigo episodes when she is doing alot.    Patient is accompained by: Family member   Husband   Pertinent History Fibromyalgia and IBS    Limitations Walking;Standing;Lifting    How long can you walk comfortably? 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 MRI brain negative for stroke    Patient Stated Goals Be able to go back to "normal", go back to work    Currently in Pain? Yes    Pain Score 7     Pain Location Leg    Pain Orientation Left    Pain Descriptors / Indicators Aching;Sore    Pain Type Chronic pain    Pain Onset More than a month ago    Pain Frequency Intermittent    Aggravating Factors  weather    Pain Relieving Factors rest                             [facility] Adult PT Treatment/Exercise - 11/08/19 0001      Transfers   Transfers Sit to Stand;Stand to Sit    Sit to Stand 6: Modified independent (Device/Increase time)    Five time sit to stand comments  30 second chair stand test: able to complete 8 sit <> stands w/ UE support. increased fatigue/shakiness at end of completion.     Stand to Sit 6: Modified independent (Device/Increase time)      Ambulation/Gait   Ambulation/Gait Yes    Ambulation/Gait Assistance 6: Modified independent (Device/Increase time)    Ambulation/Gait Assistance Details unable to complete outdoor gait today due to  weather, but patient demo ability to ambulate outdoors for increased distances with no balance deficit noted in prior session. Completed ambulation on indoor surfaces x 1173 ft with SPC with quad tip attachment and Mod I level. Patient able to complete ambulation without standing rest breaks, increased fatigue noted at end.     Ambulation Distance (Feet) 1173 Feet    Assistive device None    Gait Pattern Step-through pattern;Decreased arm swing - left;Decreased arm swing - right;Decreased step length - right;Decreased step length - left;Decreased stance time - left;Decreased hip/knee flexion - left;Decreased dorsiflexion - left;Decreased weight shift to left;Poor foot clearance - left    Ambulation Surface Level;Indoor    Gait velocity 12.25 secs = 2.67 ft/sec    Stairs Yes    Stairs  Assistance 6: Modified independent (Device/Increase time)    Stairs Assistance Details (indicate cue type and reason) increased time for completion, but demo ability to ascend/descend stairs x 12 with single rail and alternating pattern. increased time required    Stair Management Technique One rail Right;Alternating pattern;Forwards    Number of Stairs 12    Height of Stairs 6      Standardized Balance Assessment   Standardized Balance Assessment Timed Up and Go Test      Timed Up and Go Test   TUG Normal TUG    Normal TUG (seconds) 13   w/o AD, 11.82 secs w/ [facility]     Self-Care   Self-Care Other Self-Care Comments    Other Self-Care Comments  PT provided paperwork for patient regarding disability.        Exercises   Exercises Other Exercises    Other Exercises  PT reviewed HEP to ensure compliance, PT educating on continued completion of HEP and walking program to maintain gains achieved with PT services.             Access Code: [email or web address] URL: [email or web address] Date: 10/14/2019 Prepared by: Baldomero Lamy  Exercises Sit to Stand with Hands on Knees - 1 x daily - 7 x weekly - 3 sets - 5 reps Supine Bridge - 1 x daily - 7 x weekly - 3 sets - 5 reps Clamshell - 1 x daily - 7 x weekly - 2 sets - 10 reps Supine Heel Slide - 1 x daily - 7 x weekly - 2 sets - 10 reps Walking March with Countertop Support - 1 x daily - 7 x weekly - 3 sets - 10 reps Seated Gastroc Stretch with Strap - 1 x daily - 7 x weekly - 1 sets - 3 reps - 30 hold Supine Lower Trunk Rotation - 1 x daily - 7 x weekly - 1 sets - 10 reps - 30 seconds hold Modified Thomas Stretch - 1 x daily - 7 x weekly - 1 sets - 3 reps - 30 secs - 1 min hold Seated Hamstring Stretch - 1 x daily - 7 x weekly - 1 sets - 3 reps - 30-45 seconds hold Seated March with Resistance - 1 x daily - 7 x weekly - 2 sets - 10 reps Seated Ankle Dorsiflexion with Anchored Resistance - 1 x daily - 7 x weekly - 2 sets - 10  reps Romberg Stance Eyes Closed on Foam Pad - 1 x daily - 7 x weekly - 1 sets - 3 reps - 20-30 seconds hold Romberg Stance on Foam Pad with Head Rotation - 1 x daily - 7 x weekly - 2 sets - 10 reps Romberg  Stance with Head Nods on Foam Pad - 1 x daily - 7 x weekly - 2 sets - 10 reps Tandem Stance with Eyes Closed in Corner - 1 x daily - 7 x weekly - 1 sets - 3 reps - 20-25 seconds hold       PT Education - 11/08/19 1617    Education Details Educated on progress toward LTG's, compliance with HEP    Person(s) Educated Patient;Spouse    Methods Explanation    Comprehension Verbalized understanding            PT Short Term Goals - 11/01/19 1409      PT SHORT TERM GOAL #1   Title Patient will complete TUG w/o AD in </= 15 secs to demonstrate improved functional mobility (ALL STG DUE: 10/25/19)    Baseline 17.31 secs, 13.19 secs    Time 3    Period Weeks    Status Achieved    Target Date 10/25/19      PT SHORT TERM GOAL #2   Title Patient will demo ability to complete 7 sit <> stands with 30 second chair stand test to demo improved functional strength    Baseline 7 sit <> stands w/ UE support    Time 3    Period Weeks    Status Achieved             PT Long Term Goals - 11/08/19 1414      PT LONG TERM GOAL #1   Title Pt will be independent with Final HEP and walking program (All LTG's Due: 11/15/2019)    Baseline patient reports independence with HEP and walking program    Time 6    Period Weeks    Status Achieved      PT LONG TERM GOAL #2   Title Patient will be able to ascend/descend 12 stairs, Mod I with alternating pattern and single rail for improved ability to enter/exit home    Baseline 12 stairs, alternating, single rail, Mod I    Time 6    Period Weeks    Status Achieved      PT LONG TERM GOAL #3   Title Patient will demo ability to complete 10 sit <> stands in 30 seconds with UE support to demo improved LE strength/endurance    Baseline 8 sit <> stands in  30 seconds w/ UE support    Time 6    Period Weeks    Status Not Met      PT LONG TERM GOAL #4   Title Patient will demo ability to ambulate >/= 1000 ft on various surfaces with LRAD to demo improved functional mobility and endurance    Baseline 1173 ft on indoor (outdoor not completed today due to weather, patient able to ambulate w/ SPC on outdoor surfaces.    Time 6    Period Weeks    Status Achieved      PT LONG TERM GOAL #5   Title Patient will improve TUG </= 12 seconds without AD to demo reduced fall risk    Baseline 13.00 secs w/o AD    Time 6    Period Weeks    Status Not Met      PT LONG TERM GOAL #6   Title Patient will improve gait speed to >/= 2.3 ft/sec to demonstrate improved functional mobility    Baseline 1.97 ft/sec, 2.67 ft/sec    Time 6    Period Weeks    Status Achieved  Plan - 11/08/19 1621    Clinical Impression Statement Today's skilled PT session included assessment of patient's progress toward all LTG, patient able to meet LTG #1, 2, 4, and 6 today. Patient demonstrating improvements in functiona mobility and gait on indoor/outdoor surfaces, reduced fall risk, and improved gait speed. Patient demonstrate improvements in TUG to 13 secs w/o AD, and 11.82 secs w/o AD. Pt is Mod I at this time, and required increased time for completion of activites due to fatigue. PT educaitng to continue to use AD outdoors at this time for improved stability and endurance with long ambulation distances. PT educating on readiness for discharge at this time, also educating on continued HEP and walking program to maintain progress achieved with PT services. Patient and husband verbalizing agreement with discharge today.    Personal Factors and Comorbidities Comorbidity 2    Comorbidities Fibromyalgia and IBS    Examination-Activity Limitations Bend;Squat;Stairs;Stand;Lift    Examination-Participation Restrictions Cleaning;Community Activity;Driving   Work    Stability/Clinical Decision Making Evolving/Moderate complexity    Rehab Potential Good    PT Frequency 1x / week    PT Duration 6 weeks    PT Treatment/Interventions ADLs/Self Care Home Management;Electrical Stimulation;Moist Heat;Cryotherapy;DME Instruction;Gait training;Stair training;Functional mobility training;Therapeutic activities;Therapeutic exercise;Balance training;Neuromuscular re-education;Patient/family education;Orthotic Fit/Training;Passive range of motion    Consulted and Agree with Plan of Care Patient;Family member/caregiver    Family Member Consulted Husband           Patient will benefit from skilled therapeutic intervention in order to improve the following deficits and impairments:  Abnormal gait, Decreased coordination, Difficulty walking, Decreased endurance, Decreased activity tolerance, Pain, Decreased balance, Decreased mobility, Decreased strength, Impaired sensation  Visit Diagnosis: Muscle weakness (generalized)  Unsteadiness on feet  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 Vertebral artery dissection ([place]) 07/02/2019  . Near syncope 07/02/2019  . Passive smoke exposure 07/02/2019    Jones Bales, PT, DPT 11/08/2019, 4:26 PM  [facility] [address] [place], [place], [address] Phone: [phone]   Fax:  [phone]  Name: Chyane Greer MRN: [id number] Date of Birth: 1965/08/17

## 2019-11-12 ENCOUNTER — Encounter: Payer: Self-pay | Admitting: Adult Health

## 2019-11-16 ENCOUNTER — Other Ambulatory Visit (HOSPITAL_COMMUNITY): Payer: Self-pay | Admitting: Interventional Radiology

## 2019-11-16 DIAGNOSIS — I7774 Dissection of vertebral artery: Secondary | ICD-10-CM

## 2019-11-22 ENCOUNTER — Other Ambulatory Visit: Payer: Self-pay | Admitting: Radiology

## 2019-11-23 ENCOUNTER — Ambulatory Visit (HOSPITAL_COMMUNITY)
Admission: RE | Admit: 2019-11-23 | Discharge: 2019-11-23 | Disposition: A | Discharge status: Home / Self Care | Payer: BC Managed Care – PPO | Source: Ambulatory Visit | Attending: Interventional Radiology | Admitting: Interventional Radiology

## 2019-11-23 ENCOUNTER — Other Ambulatory Visit (HOSPITAL_COMMUNITY): Payer: Self-pay | Admitting: Interventional Radiology

## 2019-11-23 ENCOUNTER — Other Ambulatory Visit: Payer: Self-pay

## 2019-11-23 DIAGNOSIS — R42 Dizziness and giddiness: Secondary | ICD-10-CM | POA: Diagnosis not present

## 2019-11-23 DIAGNOSIS — K589 Irritable bowel syndrome without diarrhea: Secondary | ICD-10-CM | POA: Insufficient documentation

## 2019-11-23 DIAGNOSIS — Z7982 Long term (current) use of aspirin: Secondary | ICD-10-CM | POA: Insufficient documentation

## 2019-11-23 DIAGNOSIS — E785 Hyperlipidemia, unspecified: Secondary | ICD-10-CM | POA: Diagnosis not present

## 2019-11-23 DIAGNOSIS — I7774 Dissection of vertebral artery: Secondary | ICD-10-CM | POA: Insufficient documentation

## 2019-11-23 DIAGNOSIS — M797 Fibromyalgia: Secondary | ICD-10-CM | POA: Diagnosis not present

## 2019-11-23 DIAGNOSIS — Z79899 Other long term (current) drug therapy: Secondary | ICD-10-CM | POA: Insufficient documentation

## 2019-11-23 DIAGNOSIS — Z7902 Long term (current) use of antithrombotics/antiplatelets: Secondary | ICD-10-CM | POA: Diagnosis not present

## 2019-11-23 DIAGNOSIS — R531 Weakness: Secondary | ICD-10-CM | POA: Insufficient documentation

## 2019-11-23 HISTORY — PX: IR 3D INDEPENDENT WKST: IMG2385

## 2019-11-23 HISTORY — PX: IR ANGIO VERTEBRAL SEL SUBCLAVIAN INNOMINATE UNI R MOD SED: IMG5365

## 2019-11-23 HISTORY — PX: IR ANGIO INTRA EXTRACRAN SEL COM CAROTID INNOMINATE BILAT MOD SED: IMG5360

## 2019-11-23 HISTORY — PX: IR ANGIO VERTEBRAL SEL VERTEBRAL UNI L MOD SED: IMG5367

## 2019-11-23 LAB — BASIC METABOLIC PANEL
Anion gap: 11 (ref 5–15)
BUN: 11 mg/dL (ref 6–20)
CO2: 21 mmol/L — ABNORMAL LOW (ref 22–32)
Calcium: 8.9 mg/dL (ref 8.9–10.3)
Chloride: 106 mmol/L (ref 98–111)
Creatinine, Ser: 0.58 mg/dL (ref 0.44–1.00)
GFR, Estimated: >60 mL/min (ref >60)
Glucose, Bld: 90 mg/dL (ref 70–99)
Potassium: 4.4 mmol/L (ref 3.5–5.1)
Sodium: 138 mmol/L (ref 135–145)

## 2019-11-23 LAB — CBC
HCT: 45.6 % (ref 36.0–46.0)
Hemoglobin: 15.4 g/dL — ABNORMAL HIGH (ref 12.0–15.0)
MCH: 30.7 pg (ref 26.0–34.0)
MCHC: 33.8 g/dL (ref 30.0–36.0)
MCV: 90.8 fL (ref 80.0–100.0)
Platelets: 354 10*3/uL (ref 150–400)
RBC: 5.02 MIL/uL (ref 3.87–5.11)
RDW: 11.9 % (ref 11.5–15.5)
WBC: 9.3 10*3/uL (ref 4.0–10.5)
nRBC: 0 % (ref 0.0–0.2)

## 2019-11-23 LAB — PROTIME-INR
INR: 0.9 (ref 0.8–1.2)
Prothrombin Time: 12.2 seconds (ref 11.4–15.2)

## 2019-11-23 LAB — APTT: aPTT: 34 seconds (ref 24–36)

## 2019-11-23 MED ORDER — FENTANYL CITRATE (PF) 100 MCG/2ML IJ SOLN
INTRAMUSCULAR | Status: AC
  Filled 2019-11-23: qty 2

## 2019-11-23 MED ORDER — SODIUM CHLORIDE 0.9 % IV SOLN: INTRAVENOUS | Status: DC

## 2019-11-23 MED ORDER — MIDAZOLAM HCL 2 MG/2ML IJ SOLN
INTRAMUSCULAR | Status: AC
  Filled 2019-11-23: qty 2

## 2019-11-23 MED ORDER — SODIUM CHLORIDE 0.9 % IV SOLN: INTRAVENOUS | Status: AC

## 2019-11-23 MED ORDER — HEPARIN SODIUM (PORCINE) 1000 UNIT/ML IJ SOLN
INTRAMUSCULAR | Status: AC | PRN
  Administered 2019-11-23: 1000 [IU] via INTRAVENOUS
  Administered 2019-11-23: 500 [IU] via INTRAVENOUS

## 2019-11-23 MED ORDER — IOHEXOL 300 MG/ML  SOLN
50.0000 mL | Freq: Once | INTRAMUSCULAR | Status: AC | PRN
  Administered 2019-11-23: 10 mL via INTRA_ARTERIAL

## 2019-11-23 MED ORDER — HEPARIN SODIUM (PORCINE) 1000 UNIT/ML IJ SOLN
INTRAMUSCULAR | Status: AC
  Filled 2019-11-23: qty 1

## 2019-11-23 MED ORDER — MIDAZOLAM HCL 2 MG/2ML IJ SOLN
INTRAMUSCULAR | Status: AC | PRN
  Administered 2019-11-23: 1 mg via INTRAVENOUS

## 2019-11-23 MED ORDER — FENTANYL CITRATE (PF) 100 MCG/2ML IJ SOLN
INTRAMUSCULAR | Status: AC | PRN
  Administered 2019-11-23 (×2): 25 ug via INTRAVENOUS

## 2019-11-23 MED ORDER — VERAPAMIL HCL 2.5 MG/ML IV SOLN
INTRAVENOUS | Status: AC
  Filled 2019-11-23: qty 2

## 2019-11-23 MED ORDER — SODIUM CHLORIDE 0.9 % IV SOLN
INTRAVENOUS | Status: AC | PRN
  Administered 2019-11-23: 250 mL via INTRAVENOUS

## 2019-11-23 MED ORDER — IOHEXOL 300 MG/ML  SOLN
150.0000 mL | Freq: Once | INTRAMUSCULAR | Status: AC | PRN
  Administered 2019-11-23: 75 mL via INTRA_ARTERIAL

## 2019-11-23 MED ORDER — LIDOCAINE HCL 1 % IJ SOLN
INTRAMUSCULAR | Status: AC
  Filled 2019-11-23: qty 20

## 2019-11-23 MED ORDER — IOHEXOL 300 MG/ML  SOLN
100.0000 mL | Freq: Once | INTRAMUSCULAR | Status: AC | PRN
  Administered 2019-11-23: 25 mL via INTRA_ARTERIAL

## 2019-11-23 NOTE — H&P (Signed)
Chief Complaint: Left sided weakness, vertigo and gait imbalance. Request is for cerebral angiogram  Referring Physician(s): Ihor Austin NP  Supervising Physician: Julieanne Cotton  Patient Status: [facility] - Out-pt  History of Present Illness: Kimberly [name] is a 54 y.o. 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 MRI from 5.28.21 reads Negative for acute infarct. Mild chronic white matter changes most likely due to small vessel ischemia. CT Angio neck reads Short segment of right V3 vertebral artery demonstrates a linear filling defect at the level of the right C1 transverse foramen. This is not extend intracranially. 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 Patient continues to have left sided deficits, vertigo and gait impairment. All other labs and medications are within acceptable parameter  Past Medical History:  Diagnosis Date  . Fibromyalgia   . IBS (irritable bowel syndrome)     No past surgical history on file.  Allergies: Patient has no known allergies.  Medications: Prior to Admission medications   Medication Sig Start Date End Date Taking? Authorizing Provider  aspirin EC 81 MG EC tablet Take 1 tablet (81 mg total) by mouth daily. 07/04/19  Yes Mikhail, Maryann, DO  atorvastatin (LIPITOR) 80 MG tablet Take 1 tablet (80 mg total) by mouth daily. 10/20/19  Yes McCue, Shanda Bumps, NP  clopidogrel (PLAVIX) 75 MG tablet Take 1 tablet (75 mg total) by mouth daily. 10/20/19  Yes McCue, Shanda Bumps, NP  diphenhydramine-acetaminophen (TYLENOL PM) 25-500 MG TABS tablet Take 1 tablet by mouth at bedtime as needed (sleep).   Yes [provider]  Meclizine HCl 25 MG CHEW Chew 25 mg by mouth 3 (three) times daily as needed (vertigo).   Yes [provider]  ondansetron  (ZOFRAN) 4 MG tablet Take 4 mg by mouth every 8 (eight) hours as needed for nausea or vomiting.   Yes [provider]  tizanidine (ZANAFLEX) 2 MG capsule Take 1 capsule (2 mg total) by mouth at bedtime. Patient taking differently: Take 2 mg by mouth at bedtime as needed for muscle spasms.  10/20/19  Yes McCue, Shanda Bumps, NP  Vitamin D, Ergocalciferol, (DRISDOL) 1.25 MG (50000 UNIT) CAPS capsule Take 50,000 Units by mouth every Monday.   Yes [provider]     No family history on file.  Social History   Socioeconomic History  . Marital status: Married    Spouse name: Not on file  . Number of children: Not on file  . Years of education: Not on file  . Highest education level: Not on file  Occupational History  . Not on file  Tobacco Use  . Smoking status: Passive Smoke Exposure - Never Smoker  . Smokeless tobacco: Never Used  Substance and Sexual Activity  . Alcohol use: Not Currently  . Drug use: Never  . Sexual activity: Yes    Partners: Male  Other Topics Concern  . Not on file  Social History Narrative  . Not on file   Social Determinants of Health   Financial Resource Strain:   . Difficulty of Paying Living Expenses: Not on file  Food Insecurity:   . Worried About Programme researcher, broadcasting/film/video in the Last Year: Not on file  . Ran Out of Food in the Last Year: Not on file  Transportation Needs:   . Lack of Transportation (Medical):  Not on file  . Lack of Transportation (Non-Medical): Not on file  Physical Activity:   . Days of Exercise per Week: Not on file  . Minutes of Exercise per Session: Not on file  Stress:   . Feeling of Stress : Not on file  Social Connections:   . Frequency of Communication with Friends and Family: Not on file  . Frequency of Social Gatherings with Friends and Family: Not on file  . Attends Religious Services: Not on file  . Active Member of Clubs or Organizations: Not on file  . Attends Banker Meetings: Not on file    . Marital Status: Not on file      Review of Systems: A 12 point ROS discussed and pertinent positives are indicated in the HPI above.  All other systems are negative.  Review of Systems  Constitutional: Negative for fatigue and fever.  HENT: Negative for congestion.   Respiratory: Negative for cough and shortness of breath.   Gastrointestinal: Negative for abdominal pain, diarrhea, nausea and vomiting.  Musculoskeletal: Positive for gait problem ( left sided weakness).       Left arm weakness with decrease in sensation to left side.   Neurological: Positive for headaches ( associated with NPO status and no caffiene. ).  Psychiatric/Behavioral: Negative for behavioral problems and confusion.    Vital Signs: There were no vitals taken for this visit.  Physical Exam Vitals and nursing note reviewed.  Constitutional:      Appearance: She is well-developed.  HENT:     Head: Normocephalic and atraumatic.  Eyes:     Conjunctiva/sclera: Conjunctivae normal.  Cardiovascular:     Rate and Rhythm: Normal rate and regular rhythm.     Heart sounds: Normal heart sounds.  Pulmonary:     Effort: Pulmonary effort is normal.     Breath sounds: Normal breath sounds.  Musculoskeletal:        General: Normal range of motion.     Cervical back: Normal range of motion.  Skin:    General: Skin is warm.  Neurological:     Mental Status: She is alert and oriented to person, place, and time.     Comments: Alert, aware and oriented X 3 Speech and comprehension is intact  Comprehension and fluency are normal.  Judgment and insight normal No facial droop noted Tongue midline Can spontaneously move all 4 extremities.  Left grip strength 4/5 Right grip strength 5/5  Left lower extremity drift Left lower extremity decrease in sensation. Fine motor and coordination slow intact.  Gaitnot assessed Rombergnot assessed Heel to toenot assessed Distal pulsesnot assessed      Imaging: No  results found.  Labs:  CBC: Recent Labs    07/02/19 1110 07/02/19 1140  WBC 5.9  --   HGB 15.4* 16.0*  HCT 46.2* 47.0*  PLT 391  --     COAGS: Recent Labs    07/02/19 1110  INR 1.0  APTT 36    BMP: Recent Labs    07/02/19 1110 07/02/19 1140  NA 139 142  K 3.7 3.6  CL 107 107  CO2 21*  --   GLUCOSE 90 90  BUN 5* 5*  CALCIUM 9.4  --   CREATININE 0.54 0.50  GFRNONAA >60  --   GFRAA >60  --     LIVER FUNCTION TESTS: Recent Labs    07/02/19 1110  BILITOT 1.4*  AST 28  ALT 32  ALKPHOS 79  PROT 7.7  ALBUMIN 4.3    Assessment and Plan:  54 y.o. female outpatient. 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 MRI from 5.28.21 reads Negative for acute infarct. Mild chronic white matter changes most likely due to small vessel ischemia. CT Angio neck reads Short segment of right V3 vertebral artery demonstrates a linear filling defect at the level of the right C1 transverse foramen. This is not extend intracranially. 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 Patient continues to have left sided deficits, vertigo and gait impairment. All other labs and medications are within acceptable parameters. NKDA allergies. Patient has been NPO since midnight.   Risks and benefits of cerebral angiogram were discussed with the patient including, but not limited to bleeding, infection, vascular injury or contrast induced renal failure.  This interventional procedure involves the use of X-rays and because of the nature of the planned procedure, it is possible that we will have prolonged use of X-ray fluoroscopy.  Potential radiation risks to you include (but are not limited to) the following: - A slightly elevated risk for cancer  several years later in life. This risk is typically  less than 0.5% percent. This risk is low in comparison to the normal incidence of human cancer, which is 33% for women and 50% for men according to the [employer] Society. - Radiation induced injury can include skin redness, resembling a rash, tissue breakdown / ulcers and hair loss (which can be temporary or permanent).   The likelihood of either of these occurring depends on the difficulty of the procedure and whether you are sensitive to radiation due to previous procedures, disease, or genetic conditions.   IF your procedure requires a prolonged use of radiation, you will be notified and given written instructions for further action.  It is your responsibility to monitor the irradiated area for the 2 weeks following the procedure and to notify your physician if you are concerned that you have suffered a radiation induced injury.    All of the patient's questions were answered, patient is agreeable to proceed.  Consent signed and in chart.   Thank you for this interesting consult.  I greatly enjoyed meeting Deisy Ozbun and look forward to participating in their care.  A copy of this report was sent to the requesting provider on this date.  Electronically Signed: Alene Mires, NP 11/23/2019, 8:28 AM   I spent a total of  30 Minutes   in face to face in clinical consultation, greater than 50% of which was counseling/coordinating care for cerebral angiogram.

## 2019-11-23 NOTE — Sedation Documentation (Signed)
02 C02 monitor removed per md

## 2019-11-23 NOTE — Procedures (Signed)
S/p 4 vessel cerebral artreriogra. Rt CFA approach. Findings. 1.Approx 15 mm fenestration of  RT VA at C1 witout stenosis. 2.Mild FMD like changes distal Lt VA at C1.  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 S.Christoph Copelan MD

## 2019-11-23 NOTE — Discharge Instructions (Signed)
Femoral Site Care This sheet gives you information about how to care for yourself after your procedure. Your health care provider may also give you more specific instructions. If you have problems or questions, contact your health care provider. What can I expect after the procedure? After the procedure, it is common to have:  Bruising that usually fades within 1-2 weeks.  Tenderness at the site. Follow these instructions at home: Wound care  Follow instructions from your health care provider about how to take care of your insertion site. Make sure you: ? Wash your hands with soap and water before you change your bandage (dressing). If soap and water are not available, use hand sanitizer. ? Change your dressing as told by your health care provider. ? Leave stitches (sutures), skin glue, or adhesive strips in place. These skin closures may need to stay in place for 2 weeks or longer. If adhesive strip edges start to loosen and curl up, you may trim the loose edges. Do not remove adhesive strips completely unless your health care provider tells you to do that.  Do not take baths, swim, or use a hot tub until your health care provider approves.  You may shower 24-48 hours after the procedure or as told by your health care provider. ? Gently wash the site with plain soap and water. ? Pat the area dry with a clean towel. ? Do not rub the site. This may cause bleeding.  Do not apply powder or lotion to the site. Keep the site clean and dry.  Check your femoral site every day for signs of infection. Check for: ? Redness, swelling, or pain. ? Fluid or blood. ? Warmth. ? Pus or a bad smell. Activity  For the first 2-3 days after your procedure, or as long as directed: ? Avoid climbing stairs as much as possible. ? Do not squat.  Do not lift anything that is heavier than 10 lb (4.5 kg), or the limit that you are told, until your health care provider says that it is safe.  Rest as  directed. ? Avoid sitting for a long time without moving. Get up to take short walks every 1-2 hours.  Do not drive for 24 hours if you were given a medicine to help you relax (sedative). General instructions  Take over-the-counter and prescription medicines only as told by your health care provider.  Keep all follow-up visits as told by your health care provider. This is important. Contact a health care provider if you have:  A fever or chills.  You have redness, swelling, or pain around your insertion site. Get help right away if:  The catheter insertion area swells very fast.  You pass out.  You suddenly start to sweat or your skin gets clammy.  The catheter insertion area is bleeding, and the bleeding does not stop when you hold steady pressure on the area.  The area near or just beyond the catheter insertion site becomes pale, cool, tingly, or numb. These symptoms may represent a serious problem that is an emergency. Do not wait to see if the symptoms will go away. Get medical help right away. Call your local emergency services (911 in the [place] Do not drive yourself to the hospital. Summary  After the procedure, it is common to have bruising that usually fades within 1-2 weeks.  Check your femoral site every day for signs of infection.  Do not lift anything that is heavier than 10 lb (4.5 kg), or the   limit that you are told, until your health care provider says that it is safe. This information is not intended to replace advice given to you by your health care provider. Make sure you discuss any questions you have with your health care provider. Document Revised: 02/03/2017 Document Reviewed: 02/03/2017 Elsevier Patient Education  2020 Elsevier Inc.  

## 2019-11-26 ENCOUNTER — Other Ambulatory Visit: Payer: Self-pay

## 2019-11-26 ENCOUNTER — Ambulatory Visit (HOSPITAL_COMMUNITY)
Admission: RE | Admit: 2019-11-26 | Discharge: 2019-11-26 | Disposition: A | Discharge status: Home / Self Care | Payer: BC Managed Care – PPO | Source: Ambulatory Visit | Attending: Interventional Radiology | Admitting: Interventional Radiology

## 2019-11-26 DIAGNOSIS — I7774 Dissection of vertebral artery: Secondary | ICD-10-CM

## 2019-11-29 ENCOUNTER — Other Ambulatory Visit (HOSPITAL_COMMUNITY): Payer: Self-pay | Admitting: Interventional Radiology

## 2019-11-29 DIAGNOSIS — I671 Cerebral aneurysm, nonruptured: Secondary | ICD-10-CM

## 2019-12-02 ENCOUNTER — Encounter (HOSPITAL_COMMUNITY): Payer: Self-pay | Admitting: Interventional Radiology

## 2019-12-02 ENCOUNTER — Other Ambulatory Visit: Payer: Self-pay

## 2019-12-02 NOTE — Progress Notes (Signed)
PCP -  Dr Wyline Mood Cardiologist - n/a Neuro - Ihor Austin, NP  Chest x-ray - 07/02/19 (1V) EKG - 07/02/19 Stress Test - n/a ECHO - 07/03/19 Cardiac Cath - n/a  Blood Thinner Instructions:  Follow your surgeon's instructions on when to stop Plavix prior to surgery.  Aspirin Instructions: Follow your surgeon's instructions on when to stop aspirin prior to surgery,  If no instructions were given by your surgeon then you will need to call the office for those instructions.  Anesthesia review: Yes  STOP now taking any Aspirin (unless otherwise instructed by your surgeon), Aleve, Naproxen, Ibuprofen, Motrin, Advil, Goody's, BC's, all herbal medications, fish oil, and all vitamins.   Coronavirus Screening Covid test is scheduled on Fri, 12/03/19. Do you have any of the following symptoms:  Cough yes/no: No Fever (>100.76F)  yes/no: No Runny nose yes/no: No Sore throat yes/no: No Difficulty breathing/shortness of breath  yes/no: No  Have you traveled in the last 14 days and where? yes/no: No  Patient verbalized understanding of instructions that were given via phone.

## 2019-12-03 ENCOUNTER — Other Ambulatory Visit (HOSPITAL_COMMUNITY): Payer: Self-pay | Admitting: Radiology

## 2019-12-03 ENCOUNTER — Other Ambulatory Visit: Payer: Self-pay | Admitting: Student

## 2019-12-03 ENCOUNTER — Other Ambulatory Visit (HOSPITAL_COMMUNITY)
Admission: RE | Admit: 2019-12-03 | Discharge: 2019-12-03 | Disposition: A | Discharge status: Home / Self Care | Payer: BC Managed Care – PPO | Source: Ambulatory Visit | Attending: Interventional Radiology | Admitting: Interventional Radiology

## 2019-12-03 DIAGNOSIS — Z20822 Contact with and (suspected) exposure to covid-19: Secondary | ICD-10-CM | POA: Insufficient documentation

## 2019-12-03 DIAGNOSIS — Z01812 Encounter for preprocedural laboratory examination: Secondary | ICD-10-CM | POA: Insufficient documentation

## 2019-12-03 DIAGNOSIS — I7774 Dissection of vertebral artery: Secondary | ICD-10-CM

## 2019-12-03 LAB — PLATELET INHIBITION P2Y12: Platelet Function  P2Y12: 84 [PRU] — ABNORMAL LOW (ref 182–335)

## 2019-12-03 NOTE — Anesthesia Preprocedure Evaluation (Addendum)
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))    GI/Hepatic negative GI ROS, Neg liver ROS,   Endo/Other  negative endocrine ROS  Renal/GU negative Renal ROS  negative genitourinary   Musculoskeletal  (+) Fibromyalgia -  Abdominal   Peds  Hematology ASA/Plavix   Anesthesia Other Findings  Echo 07/03/19: EF 55-60%, normal RV function, normal valves  Reproductive/Obstetrics                          Anesthesia Physical Anesthesia Plan  ASA: III  Anesthesia Plan: General   Post-op Pain Management:    Induction: Intravenous  PONV Risk Score and Plan: 3 and Ondansetron, Dexamethasone, Treatment may vary due to age or medical condition and Midazolam  Airway Management Planned: Oral ETT  Additional Equipment: Arterial line  Intra-op Plan:   Post-operative Plan: Extubation in OR  Informed Consent: I have reviewed the patients History and Physical, chart, labs and discussed the procedure including the risks, benefits and alternatives for the proposed anesthesia with the patient or authorized representative who has indicated his/her understanding and acceptance.     Dental advisory given  Plan Discussed with:   Anesthesia Plan Comments: (PAT note written 12/03/2019 by Shonna Chock, PA-C. )      Anesthesia Quick Evaluation

## 2019-12-03 NOTE — Progress Notes (Signed)
Anesthesia Chart Review:  Case: [id number] Date/Time: 12/06/19 0815   Procedure: IR WITH ANESTHESIA EMBOLIZATION (N/A )   Anesthesia type: General   Pre-op diagnosis: BRAIN ANEURSYM   Location: [facility] RAD[facility]OLOGY [facility] / [facility] OR   Surgeons: Julieanne Cotton, MD      DISCUSSION: Patient is a 54 year old female scheduled for the above procedure.  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  History includes passive smoke exposure, fibromyalgia, IBS, HTN, HLD, COPD, anxiety, dizziness, insomnia, headaches, right vertebral artery dissection (presented 07/02/19 with presyncope/vertigo with persistent headache, left sides weakness/tingling x 1 day, CTA showed short right vertebral artery dissection flap at the C1 level without intracranial extension and brain MRI was negative for CVA, discharged on ASA/Plavix with Neuro follow-up).  Repeat CTA in July showed continued appearance of right VA dissection, and Neurology referred her to IR for further evaluation. 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   12/03/19 P2y12 is 84.  She confirms she is on aspirin and Plavix.  12/03/2019 presurgical COVID-19 test is in process.  Anesthesia team to evaluate on the day of surgery.  She has additional lab orders per IR.   VS:  BP Readings from Last 3 Encounters:  11/23/19 (!) 137/93  10/20/19 (!) 144/86  07/27/19 127/89   Pulse Readings from Last 3 Encounters:  11/23/19 93  10/20/19 72  07/27/19 89     PROVIDERS: Suzan Slick, MD is PCP [facility] Care Everywhere) Delia Heady, MD is neurologist    LABS: Labs on day of procedure per IR orders. As of 11/23/19, Cr 0.58, glucose 90, H/H 15.4/45.6, PLT 354, normal  PT/PTT. A1c 5.3% 07/03/19.     IMAGES: Bilateral common carotid and innominate angiography 11/23/19: IMPRESSION: - Splitting of the right vertebral artery at the level C1 for a distance of 15 mm before rejoining to form the vertebral artery most consistent with a fenestration. Relatively smooth arms of the fenestration. - Associated mild proximal fusiform dilatation of the right vertebral artery. - Approximately 4 mm x 3.5 mm saccular outpouching with wide neck at the level of the left ophthalmic artery consistent with a paraophthalmic aneurysm. - Associated fusiform dysplastic changes of the caval cavernous segment of the left internal carotid artery. Mild FMD-like changes of the left vertebral artery at the skull base.   CTA neck 08/07/19 (copy in PACS/Canopy): IMPRESSION: 1. Unchanged right vertebral artery since 07/02/2019, where either a stable short segment arterial dissection or a congenital fenestration of the vessel (normal variant) is present at the C1 level. No associated stenosis. And if this finding remains unchanged over time a congenital fenestration would be more likely. 2. No other arterial abnormality in the neck. Tortuous proximal great vessels. Proximal left subclavian artery atherosclerosis without stenosis. Mild atherosclerosis in the distal aortic arch.   MRI Brain 07/02/19: IMPRESSION: Negative for acute infarct. Mild chronic white matter changes most likely due to small vessel ischemia.   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 MRI recommended to exclude acute infarct.   1V PCXR 07/02/19: FINDINGS: The cardiac silhouette remains near the upper limit of normal in size. The lungs remain mildly hyperexpanded with mild  peribronchial thickening. Interval minimal left basilar atelectasis. Unremarkable bones. IMPRESSION: 1. Interval minimal left basilar  atelectasis. 2. Stable mild changes of COPD.   EKG: 07/02/19: Normal sinus rhythm no acute ST/T changes No old tracing to compare Confirmed by Pricilla Loveless [phone]) on 07/02/2019 12:02:25 PM   CV: Echo 07/03/19: IMPRESSIONS  1. Left ventricular ejection fraction, by estimation, is 55 to 60%. The  left ventricle has normal function. The left ventricle has no regional  wall motion abnormalities. Left ventricular diastolic parameters were  normal.  2. Right ventricular systolic function is normal. The right ventricular  size is normal. There is normal pulmonary artery systolic pressure. The  estimated right ventricular systolic pressure is 19.0 mmHg.  3. The mitral valve is grossly normal. No evidence of mitral valve  regurgitation. No evidence of mitral stenosis.  4. The aortic valve is tricuspid. Aortic valve regurgitation is not  visualized. No aortic stenosis is present.  5. The inferior vena cava is normal in size with greater than 50%  respiratory variability, suggesting right atrial pressure of 3 mmHg.  - Conclusion(s)/Recommendation(s): Normal biventricular function without  evidence of hemodynamically significant valvular heart disease.    Past Medical History:  Diagnosis Date  . Anxiety   . COPD (chronic obstructive pulmonary disease) (HCC)    mild - no inhaler  . Dizziness   . Fibromyalgia   . Headache   . HLD (hyperlipidemia)   . Hypertension   . IBS (irritable bowel syndrome)   . Insomnia   . Stroke [facility]) 06/27/2019    Past Surgical History:  Procedure Laterality Date  . CHOLECYSTECTOMY  2003  . COLONOSCOPY    . IR 3D INDEPENDENT WKST  11/23/2019  . IR ANGIO INTRA EXTRACRAN SEL COM CAROTID INNOMINATE BILAT MOD SED  11/23/2019  . IR ANGIO VERTEBRAL SEL SUBCLAVIAN INNOMINATE UNI R MOD SED  11/23/2019  . IR ANGIO VERTEBRAL SEL VERTEBRAL UNI L MOD SED  11/23/2019  . LAPAROSCOPY  2003   removal of kidney cyst  . vaginal hysterectiny  04/1999     MEDICATIONS: No current facility-administered medications for this encounter.   Marland Kitchen aspirin EC 81 MG EC tablet  . atorvastatin (LIPITOR) 80 MG tablet  . clopidogrel (PLAVIX) 75 MG tablet  . diphenhydramine-acetaminophen (TYLENOL PM) 25-500 MG TABS tablet  . Meclizine HCl 25 MG CHEW  . ondansetron (ZOFRAN) 4 MG tablet  . tizanidine (ZANAFLEX) 2 MG capsule  . Vitamin D, Ergocalciferol, (DRISDOL) 1.25 MG (50000 UNIT) CAPS capsule    Shonna Chock, PA-C Surgical Short Stay/Anesthesiology [facility] Phone [phone] [facility] Phone [phone] 12/03/2019 3:18 PM

## 2019-12-04 LAB — SARS CORONAVIRUS 2 (TAT 6-24 HRS): SARS Coronavirus 2: NEGATIVE

## 2019-12-06 ENCOUNTER — Encounter (HOSPITAL_COMMUNITY): Payer: Self-pay | Admitting: Interventional Radiology

## 2019-12-06 ENCOUNTER — Ambulatory Visit (HOSPITAL_COMMUNITY)
Admission: RE | Admit: 2019-12-06 | Discharge: 2019-12-06 | Disposition: A | Discharge status: Home / Self Care | Payer: BC Managed Care – PPO | Source: Ambulatory Visit | Attending: Interventional Radiology | Admitting: Interventional Radiology

## 2019-12-06 ENCOUNTER — Other Ambulatory Visit: Payer: Self-pay

## 2019-12-06 ENCOUNTER — Ambulatory Visit (HOSPITAL_COMMUNITY): Payer: BC Managed Care – PPO | Admitting: Vascular Surgery

## 2019-12-06 ENCOUNTER — Inpatient Hospital Stay (HOSPITAL_COMMUNITY)
Admission: AD | Admit: 2019-12-06 | Discharge: 2019-12-07 | DRG: 025 | Disposition: A | Discharge status: Home / Self Care | Payer: BC Managed Care – PPO | Attending: Interventional Radiology | Admitting: Interventional Radiology

## 2019-12-06 ENCOUNTER — Encounter (HOSPITAL_COMMUNITY)
Admission: AD | Disposition: A | Discharge status: Home / Self Care | Payer: Self-pay | Source: Home / Self Care | Attending: Interventional Radiology

## 2019-12-06 DIAGNOSIS — E785 Hyperlipidemia, unspecified: Secondary | ICD-10-CM | POA: Diagnosis present

## 2019-12-06 DIAGNOSIS — I671 Cerebral aneurysm, nonruptured: Secondary | ICD-10-CM

## 2019-12-06 DIAGNOSIS — M797 Fibromyalgia: Secondary | ICD-10-CM | POA: Diagnosis present

## 2019-12-06 DIAGNOSIS — J449 Chronic obstructive pulmonary disease, unspecified: Secondary | ICD-10-CM | POA: Diagnosis present

## 2019-12-06 DIAGNOSIS — Z8673 Personal history of transient ischemic attack (TIA), and cerebral infarction without residual deficits: Secondary | ICD-10-CM | POA: Diagnosis not present

## 2019-12-06 DIAGNOSIS — R519 Headache, unspecified: Secondary | ICD-10-CM | POA: Diagnosis present

## 2019-12-06 DIAGNOSIS — Z7722 Contact with and (suspected) exposure to environmental tobacco smoke (acute) (chronic): Secondary | ICD-10-CM | POA: Diagnosis present

## 2019-12-06 DIAGNOSIS — G4700 Insomnia, unspecified: Secondary | ICD-10-CM | POA: Diagnosis present

## 2019-12-06 DIAGNOSIS — Z20822 Contact with and (suspected) exposure to covid-19: Secondary | ICD-10-CM | POA: Diagnosis present

## 2019-12-06 DIAGNOSIS — I7774 Dissection of vertebral artery: Secondary | ICD-10-CM | POA: Diagnosis present

## 2019-12-06 DIAGNOSIS — K589 Irritable bowel syndrome without diarrhea: Secondary | ICD-10-CM | POA: Diagnosis present

## 2019-12-06 DIAGNOSIS — I10 Essential (primary) hypertension: Secondary | ICD-10-CM | POA: Diagnosis present

## 2019-12-06 DIAGNOSIS — Z7982 Long term (current) use of aspirin: Secondary | ICD-10-CM

## 2019-12-06 DIAGNOSIS — Z23 Encounter for immunization: Secondary | ICD-10-CM | POA: Diagnosis not present

## 2019-12-06 DIAGNOSIS — Z7902 Long term (current) use of antithrombotics/antiplatelets: Secondary | ICD-10-CM

## 2019-12-06 DIAGNOSIS — F419 Anxiety disorder, unspecified: Secondary | ICD-10-CM | POA: Diagnosis present

## 2019-12-06 HISTORY — DX: Hyperlipidemia, unspecified: E78.5

## 2019-12-06 HISTORY — DX: Insomnia, unspecified: G47.00

## 2019-12-06 HISTORY — DX: Anxiety disorder, unspecified: F41.9

## 2019-12-06 HISTORY — DX: Essential (primary) hypertension: I10

## 2019-12-06 HISTORY — DX: Chronic obstructive pulmonary disease, unspecified: J44.9

## 2019-12-06 HISTORY — PX: IR ANGIOGRAM FOLLOW UP STUDY: IMG697

## 2019-12-06 HISTORY — DX: Dizziness and giddiness: R42

## 2019-12-06 HISTORY — PX: IR TRANSCATH/EMBOLIZ: IMG695

## 2019-12-06 HISTORY — PX: IR ANGIO INTRA EXTRACRAN SEL INTERNAL CAROTID UNI L MOD SED: IMG5361

## 2019-12-06 HISTORY — DX: Headache, unspecified: R51.9

## 2019-12-06 HISTORY — PX: RADIOLOGY WITH ANESTHESIA: SHX6223

## 2019-12-06 LAB — POCT ACTIVATED CLOTTING TIME
Activated Clotting Time: 208 seconds
Activated Clotting Time: 241 seconds

## 2019-12-06 LAB — CBC WITH DIFFERENTIAL/PLATELET
Abs Immature Granulocytes: 0.02 10*3/uL (ref 0.00–0.07)
Basophils Absolute: 0.1 10*3/uL (ref 0.0–0.1)
Basophils Relative: 1 %
Eosinophils Absolute: 0.2 10*3/uL (ref 0.0–0.5)
Eosinophils Relative: 2 %
HCT: 45.2 % (ref 36.0–46.0)
Hemoglobin: 15 g/dL (ref 12.0–15.0)
Immature Granulocytes: 0 %
Lymphocytes Relative: 31 %
Lymphs Abs: 2.2 10*3/uL (ref 0.7–4.0)
MCH: 30.3 pg (ref 26.0–34.0)
MCHC: 33.2 g/dL (ref 30.0–36.0)
MCV: 91.3 fL (ref 80.0–100.0)
Monocytes Absolute: 0.6 10*3/uL (ref 0.1–1.0)
Monocytes Relative: 9 %
Neutro Abs: 3.9 10*3/uL (ref 1.7–7.7)
Neutrophils Relative %: 57 %
Platelets: 341 10*3/uL (ref 150–400)
RBC: 4.95 MIL/uL (ref 3.87–5.11)
RDW: 11.9 % (ref 11.5–15.5)
WBC: 6.9 10*3/uL (ref 4.0–10.5)
nRBC: 0 % (ref 0.0–0.2)

## 2019-12-06 LAB — APTT: aPTT: 36 seconds (ref 24–36)

## 2019-12-06 LAB — MRSA PCR SCREENING: MRSA by PCR: NEGATIVE

## 2019-12-06 LAB — BASIC METABOLIC PANEL
Anion gap: 11 (ref 5–15)
BUN: 7 mg/dL (ref 6–20)
CO2: 23 mmol/L (ref 22–32)
Calcium: 9.1 mg/dL (ref 8.9–10.3)
Chloride: 106 mmol/L (ref 98–111)
Creatinine, Ser: 0.53 mg/dL (ref 0.44–1.00)
GFR, Estimated: >60 mL/min (ref >60)
Glucose, Bld: 103 mg/dL — ABNORMAL HIGH (ref 70–99)
Potassium: 3.1 mmol/L — ABNORMAL LOW (ref 3.5–5.1)
Sodium: 140 mmol/L (ref 135–145)

## 2019-12-06 LAB — PLATELET INHIBITION P2Y12: Platelet Function  P2Y12: 149 [PRU] — ABNORMAL LOW (ref 182–335)

## 2019-12-06 LAB — PROTIME-INR
INR: 1 (ref 0.8–1.2)
Prothrombin Time: 12.7 seconds (ref 11.4–15.2)

## 2019-12-06 LAB — HEPARIN LEVEL (UNFRACTIONATED): Heparin Unfractionated: 0.22 IU/mL — ABNORMAL LOW (ref 0.30–0.70)

## 2019-12-06 SURGERY — IR WITH ANESTHESIA
Anesthesia: General

## 2019-12-06 MED ORDER — FENTANYL CITRATE (PF) 100 MCG/2ML IJ SOLN: 25.0000 ug | INTRAMUSCULAR | Status: DC | PRN

## 2019-12-06 MED ORDER — HEPARIN SODIUM (PORCINE) 1000 UNIT/ML IJ SOLN
INTRAMUSCULAR | Status: DC | PRN
  Administered 2019-12-06: 3000 [IU] via INTRAVENOUS
  Administered 2019-12-06: 1000 [IU] via INTRAVENOUS

## 2019-12-06 MED ORDER — PNEUMOCOCCAL VAC POLYVALENT 25 MCG/0.5ML IJ INJ
0.5000 mL | INJECTION | INTRAMUSCULAR | Status: AC | PRN
  Administered 2019-12-07: 0.5 mL via INTRAMUSCULAR
  Filled 2019-12-06: qty 0.5

## 2019-12-06 MED ORDER — HEPARIN SODIUM (PORCINE) 1000 UNIT/ML IJ SOLN
INTRAMUSCULAR | Status: AC
  Filled 2019-12-06: qty 1

## 2019-12-06 MED ORDER — ONDANSETRON HCL 4 MG/2ML IJ SOLN: 4.0000 mg | Freq: Once | INTRAMUSCULAR | Status: DC | PRN

## 2019-12-06 MED ORDER — CEFAZOLIN SODIUM-DEXTROSE 2-4 GM/100ML-% IV SOLN
INTRAVENOUS | Status: AC
  Filled 2019-12-06: qty 100

## 2019-12-06 MED ORDER — ASPIRIN EC 325 MG PO TBEC: 325.0000 mg | DELAYED_RELEASE_TABLET | ORAL | Status: DC

## 2019-12-06 MED ORDER — HEPARIN (PORCINE) 25000 UT/250ML-% IV SOLN: 500.0000 [IU]/h | INTRAVENOUS | Status: DC

## 2019-12-06 MED ORDER — CHLORHEXIDINE GLUCONATE CLOTH 2 % EX PADS
6.0000 | MEDICATED_PAD | Freq: Every day | CUTANEOUS | Status: DC
  Administered 2019-12-06: 6 via TOPICAL

## 2019-12-06 MED ORDER — PHENYLEPHRINE HCL-NACL 10-0.9 MG/250ML-% IV SOLN
INTRAVENOUS | Status: DC | PRN
  Administered 2019-12-06: 30 ug/min via INTRAVENOUS

## 2019-12-06 MED ORDER — CLOPIDOGREL BISULFATE 75 MG PO TABS
75.0000 mg | ORAL_TABLET | Freq: Every day | ORAL | Status: DC
  Administered 2019-12-07: 75 mg via ORAL
  Filled 2019-12-06: qty 1

## 2019-12-06 MED ORDER — LIDOCAINE 2% (20 MG/ML) 5 ML SYRINGE
INTRAMUSCULAR | Status: DC | PRN
  Administered 2019-12-06: 100 mg via INTRAVENOUS

## 2019-12-06 MED ORDER — EPTIFIBATIDE 20 MG/10ML IV SOLN
INTRAVENOUS | Status: DC | PRN
  Administered 2019-12-06 (×4): 1.5 mg via INTRAVENOUS

## 2019-12-06 MED ORDER — ASPIRIN 81 MG PO CHEW: 81.0000 mg | CHEWABLE_TABLET | Freq: Every day | ORAL | Status: DC

## 2019-12-06 MED ORDER — OXYCODONE HCL 5 MG/5ML PO SOLN: 5.0000 mg | Freq: Once | ORAL | Status: DC | PRN

## 2019-12-06 MED ORDER — TIZANIDINE HCL 2 MG PO TABS
2.0000 mg | ORAL_TABLET | Freq: Every evening | ORAL | Status: DC | PRN
  Filled 2019-12-06: qty 1

## 2019-12-06 MED ORDER — CLEVIDIPINE BUTYRATE 0.5 MG/ML IV EMUL
INTRAVENOUS | Status: AC
  Filled 2019-12-06: qty 50

## 2019-12-06 MED ORDER — DEXAMETHASONE SODIUM PHOSPHATE 10 MG/ML IJ SOLN
INTRAMUSCULAR | Status: DC | PRN
  Administered 2019-12-06: 10 mg via INTRAVENOUS

## 2019-12-06 MED ORDER — SUGAMMADEX SODIUM 200 MG/2ML IV SOLN
INTRAVENOUS | Status: DC | PRN
  Administered 2019-12-06: 200 mg via INTRAVENOUS

## 2019-12-06 MED ORDER — NITROGLYCERIN 1 MG/10 ML FOR IR/CATH LAB
INTRA_ARTERIAL | Status: AC
  Filled 2019-12-06: qty 10

## 2019-12-06 MED ORDER — CLOPIDOGREL BISULFATE 75 MG PO TABS: 75.0000 mg | ORAL_TABLET | ORAL | Status: DC

## 2019-12-06 MED ORDER — CHLORHEXIDINE GLUCONATE 0.12 % MT SOLN
OROMUCOSAL | Status: AC
  Administered 2019-12-06: 15 mL via OROMUCOSAL
  Filled 2019-12-06: qty 15

## 2019-12-06 MED ORDER — HEPARIN (PORCINE) 25000 UT/250ML-% IV SOLN
INTRAVENOUS | Status: AC
  Administered 2019-12-06: 500 [IU]/h via INTRAVENOUS
  Filled 2019-12-06: qty 250

## 2019-12-06 MED ORDER — SODIUM CHLORIDE 0.9 % IV SOLN: INTRAVENOUS | Status: DC

## 2019-12-06 MED ORDER — ONDANSETRON HCL 4 MG PO TABS: 4.0000 mg | ORAL_TABLET | Freq: Three times a day (TID) | ORAL | Status: DC | PRN

## 2019-12-06 MED ORDER — CEFAZOLIN SODIUM-DEXTROSE 2-4 GM/100ML-% IV SOLN
2.0000 g | INTRAVENOUS | Status: AC
  Administered 2019-12-06: 2 g via INTRAVENOUS
  Filled 2019-12-06: qty 100

## 2019-12-06 MED ORDER — LIDOCAINE HCL 1 % IJ SOLN
INTRAMUSCULAR | Status: AC
  Filled 2019-12-06: qty 20

## 2019-12-06 MED ORDER — ACETAMINOPHEN 160 MG/5ML PO SOLN: 650.0000 mg | ORAL | Status: DC | PRN

## 2019-12-06 MED ORDER — LACTATED RINGERS IV SOLN: INTRAVENOUS | Status: DC

## 2019-12-06 MED ORDER — EPHEDRINE SULFATE-NACL 50-0.9 MG/10ML-% IV SOSY
PREFILLED_SYRINGE | INTRAVENOUS | Status: DC | PRN
  Administered 2019-12-06: 5 mg via INTRAVENOUS

## 2019-12-06 MED ORDER — MIDAZOLAM HCL 5 MG/5ML IJ SOLN
INTRAMUSCULAR | Status: DC | PRN
  Administered 2019-12-06: 2 mg via INTRAVENOUS

## 2019-12-06 MED ORDER — ASPIRIN EC 81 MG PO TBEC
DELAYED_RELEASE_TABLET | ORAL | Status: AC
  Administered 2019-12-06: 81 mg via ORAL
  Filled 2019-12-06: qty 1

## 2019-12-06 MED ORDER — CLOPIDOGREL BISULFATE 75 MG PO TABS: 150.0000 mg | ORAL_TABLET | ORAL | Status: AC

## 2019-12-06 MED ORDER — AMISULPRIDE (ANTIEMETIC) 5 MG/2ML IV SOLN: 10.0000 mg | Freq: Once | INTRAVENOUS | Status: AC | PRN

## 2019-12-06 MED ORDER — PROTAMINE SULFATE 10 MG/ML IV SOLN
INTRAVENOUS | Status: DC | PRN
  Administered 2019-12-06: 5 mg via INTRAVENOUS

## 2019-12-06 MED ORDER — ORAL CARE MOUTH RINSE: 15.0000 mL | Freq: Once | OROMUCOSAL | Status: AC

## 2019-12-06 MED ORDER — ACETAMINOPHEN 650 MG RE SUPP: 650.0000 mg | RECTAL | Status: DC | PRN

## 2019-12-06 MED ORDER — ATORVASTATIN CALCIUM 80 MG PO TABS
80.0000 mg | ORAL_TABLET | Freq: Every day | ORAL | Status: DC
  Administered 2019-12-07: 80 mg via ORAL
  Filled 2019-12-06: qty 1

## 2019-12-06 MED ORDER — CLOPIDOGREL BISULFATE 75 MG PO TABS
ORAL_TABLET | ORAL | Status: AC
  Administered 2019-12-06: 150 mg via ORAL
  Filled 2019-12-06: qty 2

## 2019-12-06 MED ORDER — OXYCODONE HCL 5 MG PO TABS: 5.0000 mg | ORAL_TABLET | Freq: Once | ORAL | Status: DC | PRN

## 2019-12-06 MED ORDER — EPTIFIBATIDE 20 MG/10ML IV SOLN
INTRAVENOUS | Status: AC
  Filled 2019-12-06: qty 10

## 2019-12-06 MED ORDER — ONDANSETRON HCL 4 MG/2ML IJ SOLN
INTRAMUSCULAR | Status: DC | PRN
  Administered 2019-12-06: 4 mg via INTRAVENOUS

## 2019-12-06 MED ORDER — CHLORHEXIDINE GLUCONATE 0.12 % MT SOLN: 15.0000 mL | Freq: Once | OROMUCOSAL | Status: AC

## 2019-12-06 MED ORDER — IOHEXOL 300 MG/ML  SOLN
150.0000 mL | Freq: Once | INTRAMUSCULAR | Status: AC | PRN
  Administered 2019-12-06: 75 mL via INTRA_ARTERIAL

## 2019-12-06 MED ORDER — NIMODIPINE 30 MG PO CAPS
0.0000 mg | ORAL_CAPSULE | ORAL | Status: AC
  Administered 2019-12-06: 30 mg via ORAL
  Filled 2019-12-06: qty 1

## 2019-12-06 MED ORDER — ACETAMINOPHEN 325 MG PO TABS
650.0000 mg | ORAL_TABLET | ORAL | Status: DC | PRN
  Administered 2019-12-07: 650 mg via ORAL
  Filled 2019-12-06: qty 2

## 2019-12-06 MED ORDER — PROPOFOL 10 MG/ML IV BOLUS
INTRAVENOUS | Status: DC | PRN
  Administered 2019-12-06: 200 mg via INTRAVENOUS

## 2019-12-06 MED ORDER — IOHEXOL 300 MG/ML  SOLN
50.0000 mL | Freq: Once | INTRAMUSCULAR | Status: AC | PRN
  Administered 2019-12-06: 25 mL via INTRA_ARTERIAL

## 2019-12-06 MED ORDER — ASPIRIN EC 81 MG PO TBEC
81.0000 mg | DELAYED_RELEASE_TABLET | ORAL | Status: AC
  Filled 2019-12-06: qty 1

## 2019-12-06 MED ORDER — HEPARIN (PORCINE) 25000 UT/250ML-% IV SOLN
500.0000 [IU]/h | INTRAVENOUS | Status: DC
  Administered 2019-12-06: 500 [IU]/h via INTRAVENOUS

## 2019-12-06 MED ORDER — FENTANYL CITRATE (PF) 100 MCG/2ML IJ SOLN
INTRAMUSCULAR | Status: AC
  Administered 2019-12-06: 50 ug via INTRAVENOUS
  Filled 2019-12-06: qty 2

## 2019-12-06 MED ORDER — FENTANYL CITRATE (PF) 250 MCG/5ML IJ SOLN
INTRAMUSCULAR | Status: DC | PRN
Start: 2019-12-06 — End: 2019-12-06
  Administered 2019-12-06 (×2): 50 ug via INTRAVENOUS

## 2019-12-06 MED ORDER — MECLIZINE HCL 25 MG PO TABS
25.0000 mg | ORAL_TABLET | Freq: Three times a day (TID) | ORAL | Status: DC | PRN
  Filled 2019-12-06: qty 1

## 2019-12-06 MED ORDER — CLEVIDIPINE BUTYRATE 0.5 MG/ML IV EMUL
0.0000 mg/h | INTRAVENOUS | Status: DC
  Administered 2019-12-06: 6 mg/h via INTRAVENOUS
  Administered 2019-12-06: 2 mg/h via INTRAVENOUS
  Administered 2019-12-06: 18 mg/h via INTRAVENOUS
  Administered 2019-12-06: 14 mg/h via INTRAVENOUS
  Administered 2019-12-06: 4 mg/h via INTRAVENOUS
  Filled 2019-12-06 (×5): qty 50

## 2019-12-06 MED ORDER — AMISULPRIDE (ANTIEMETIC) 5 MG/2ML IV SOLN
INTRAVENOUS | Status: AC
  Administered 2019-12-06: 10 mg via INTRAVENOUS
  Filled 2019-12-06: qty 4

## 2019-12-06 MED ORDER — PHENYLEPHRINE 40 MCG/ML (10ML) SYRINGE FOR IV PUSH (FOR BLOOD PRESSURE SUPPORT)
PREFILLED_SYRINGE | INTRAVENOUS | Status: DC | PRN
  Administered 2019-12-06: 80 ug via INTRAVENOUS

## 2019-12-06 MED ORDER — ESMOLOL HCL 100 MG/10ML IV SOLN
INTRAVENOUS | Status: DC | PRN
  Administered 2019-12-06: 30 mg via INTRAVENOUS

## 2019-12-06 MED ORDER — CLOPIDOGREL BISULFATE 75 MG PO TABS: 75.0000 mg | ORAL_TABLET | Freq: Every day | ORAL | Status: DC

## 2019-12-06 MED ORDER — INFLUENZA VAC SPLIT QUAD 0.5 ML IM SUSY
0.5000 mL | PREFILLED_SYRINGE | INTRAMUSCULAR | Status: AC | PRN
  Administered 2019-12-07: 0.5 mL via INTRAMUSCULAR
  Filled 2019-12-06: qty 0.5

## 2019-12-06 MED ORDER — ASPIRIN 81 MG PO CHEW
81.0000 mg | CHEWABLE_TABLET | Freq: Every day | ORAL | Status: DC
  Administered 2019-12-07: 81 mg via ORAL
  Filled 2019-12-06: qty 1

## 2019-12-06 MED ORDER — ROCURONIUM BROMIDE 10 MG/ML (PF) SYRINGE
PREFILLED_SYRINGE | INTRAVENOUS | Status: DC | PRN
  Administered 2019-12-06: 50 mg via INTRAVENOUS
  Administered 2019-12-06: 30 mg via INTRAVENOUS
  Administered 2019-12-06: 20 mg via INTRAVENOUS

## 2019-12-06 NOTE — Progress Notes (Signed)
Pt under the care of anesthesia  

## 2019-12-06 NOTE — Progress Notes (Signed)
Verbal order from Dr. Corliss Skains:  Give ASA 81 mg Give Plavix 150 mg Give Nimotop 30 mg

## 2019-12-06 NOTE — Procedures (Signed)
S/P Lt common carotid artrriogram \\Rt  CFA approach. Findings. 1,S/P embolization of wide neck Lt ICA paraophthalmic  bilobed aneurysm with  4,23mm x 17 mm Evolve flow diverter device. Patient etubated. Denies andy H/As. C/O mild nausea. Denies any visual changes. Oriented to place and hospital. Responds appropriately to simple commands. O/E pupils 2 mm Rt = Lt. ? Mild Lt Nasolabial fold flattening. Tongue midline. Minimal Lt pronation drift Motor Lt UE and Lt LE 4+/5  To 5-/5. RT groin soft . Distal pulses dopplerable bilaterally.  S.Earlee Herald MD

## 2019-12-06 NOTE — Transfer of Care (Signed)
Immediate Anesthesia Transfer of Care Note  Patient: Kimberly [name]  Procedure(s) Performed: IR WITH ANESTHESIA EMBOLIZATION (N/A )  Patient Location: PACU  Anesthesia Type:General  Level of Consciousness: awake, alert  and oriented  Airway & Oxygen Therapy: Patient Spontanous Breathing  Post-op Assessment: Report given to RN, Post -op Vital signs reviewed and stable, Patient moving all extremities X 4 and Patient able to stick tongue midline  Post vital signs: Reviewed and stable  Last Vitals:  Vitals Value Taken Time  BP 137/99 12/06/19 1300  Temp    Pulse 91 12/06/19 1303  Resp 16 12/06/19 1303  SpO2 96 % 12/06/19 1303  Vitals shown include unvalidated device data.  Last Pain:  Vitals:   12/06/19 0728  TempSrc:   PainSc: 0-No pain         Complications: No complications documented.

## 2019-12-06 NOTE — Progress Notes (Signed)
26fr angioseal

## 2019-12-06 NOTE — Anesthesia Postprocedure Evaluation (Signed)
Anesthesia Post Note  Patient: Fidelia Cathers  Procedure(s) Performed: IR WITH ANESTHESIA EMBOLIZATION (N/A )     Patient location during evaluation: PACU Anesthesia Type: General Level of consciousness: awake and alert Pain management: pain level controlled Vital Signs Assessment: post-procedure vital signs reviewed and stable Respiratory status: spontaneous breathing, nonlabored ventilation and respiratory function stable Cardiovascular status: blood pressure returned to baseline and stable Postop Assessment: no apparent nausea or vomiting Anesthetic complications: no   No complications documented.  Last Vitals:  Vitals:   12/06/19 1430 12/06/19 1445  BP: (!) 137/106 (!) 131/92  Pulse: 71 93  Resp: 15 16  Temp: 36.9 C 36.9 C  SpO2: 99% 98%    Last Pain:  Vitals:   12/06/19 1445  TempSrc:   PainSc: 0-No pain                 Lucretia Kern

## 2019-12-06 NOTE — H&P (Addendum)
Chief Complaint: Patient was seen in consultation today for cerebral angiogram with intervention  Referring Physician(s): Julieanne Cotton  Supervising Physician: Julieanne Cotton  Patient Status: [facility] - Out-pt  History of Present Illness: Kimberly [name] is a 54 y.o. female with a medical history significant for anxiety, COPD, fibromyalgia, headache and stroke. 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 She presented to the [facility]ventional [facility]iology department 11/23/19 for a diagnostic cerebral angiogram.   Diagnostic Cerebral Angiogram 11/23/19: IMPRESSION: 1. Splitting of the right vertebral artery at the level C1 for a distance of 15 mm before rejoining to form the vertebral artery most consistent with a fenestration. Relatively smooth arms of the fenestration. 2. Associated mild proximal fusiform dilatation of the right vertebral artery. 3. Approximately 4 mm x 3.5 mm saccular outpouching with wide neck at the level of the left ophthalmic artery consistent with a paraophthalmic aneurysm. 4. 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 Mild FMD-like changes of the left vertebral artery at the skull base  Past Medical History:  Diagnosis Date  . Anxiety   . COPD (chronic obstructive pulmonary disease) (HCC)    mild - no inhaler  . Dizziness   . Fibromyalgia   . Headache   . HLD (hyperlipidemia)   . Hypertension   . IBS (irritable bowel syndrome)   . Insomnia   . Stroke [facility]) 06/27/2019    Past Surgical History:  Procedure Laterality Date  . CHOLECYSTECTOMY  2003  . COLONOSCOPY    . IR 3D INDEPENDENT WKST  11/23/2019  . IR ANGIO INTRA EXTRACRAN SEL COM CAROTID INNOMINATE BILAT MOD SED  11/23/2019  . IR ANGIO VERTEBRAL SEL SUBCLAVIAN INNOMINATE UNI R MOD SED  11/23/2019  . IR ANGIO VERTEBRAL  SEL VERTEBRAL UNI L MOD SED  11/23/2019  . LAPAROSCOPY  2003   removal of kidney cyst  . vaginal hysterectiny  04/1999    Allergies: Patient has no known allergies.  Medications: Prior to Admission medications   Medication Sig Start Date End Date Taking? Authorizing Provider  aspirin EC 81 MG EC tablet Take 1 tablet (81 mg total) by mouth daily. 07/04/19   Mikhail, Nita Sells, DO  atorvastatin (LIPITOR) 80 MG tablet Take 1 tablet (80 mg total) by mouth daily. 10/20/19   Ihor Austin, NP  clopidogrel (PLAVIX) 75 MG tablet Take 1 tablet (75 mg total) by mouth daily. 10/20/19   Ihor Austin, NP  diphenhydramine-acetaminophen (TYLENOL PM) 25-500 MG TABS tablet Take 1 tablet by mouth at bedtime as needed (sleep).    [provider]  Meclizine HCl 25 MG CHEW Chew 25 mg by mouth 3 (three) times daily as needed (vertigo).    [provider]  ondansetron (ZOFRAN) 4 MG tablet Take 4 mg by mouth every 8 (eight) hours as needed for nausea or vomiting.    [provider]  tizanidine (ZANAFLEX) 2 MG capsule Take 1 capsule (2 mg total) by mouth at bedtime. Patient taking differently: Take 2 mg by mouth at bedtime as needed for muscle spasms.  10/20/19   Ihor Austin, NP  Vitamin D, Ergocalciferol, (DRISDOL) 1.25 MG (50000 UNIT) CAPS capsule Take 50,000 Units by mouth every Monday.    [provider]     No family history on file.  Social History   Socioeconomic History  . Marital status: Married  Spouse name: Not on file  . Number of children: Not on file  . Years of education: Not on file  . Highest education level: Not on file  Occupational History  . Not on file  Tobacco Use  . Smoking status: Passive Smoke Exposure - Never Smoker  . Smokeless tobacco: Never Used  Vaping Use  . Vaping Use: Never used  Substance and Sexual Activity  . Alcohol use: Not Currently  . Drug use: Never  . Sexual activity: Yes    Partners: Male    Birth  control/protection: Surgical    Comment: Hysterectomy  Other Topics Concern  . Not on file  Social History Narrative  . Not on file   Social Determinants of Health   Financial Resource Strain:   . Difficulty of Paying Living Expenses: Not on file  Food Insecurity:   . Worried About Programme researcher, broadcasting/film/video in the Last Year: Not on file  . Ran Out of Food in the Last Year: Not on file  Transportation Needs:   . Lack of Transportation (Medical): Not on file  . Lack of Transportation (Non-Medical): Not on file  Physical Activity:   . Days of Exercise per Week: Not on file  . Minutes of Exercise per Session: Not on file  Stress:   . Feeling of Stress : Not on file  Social Connections:   . Frequency of Communication with Friends and Family: Not on file  . Frequency of Social Gatherings with Friends and Family: Not on file  . Attends Religious Services: Not on file  . Active Member of Clubs or Organizations: Not on file  . Attends Banker Meetings: Not on file  . Marital Status: Not on file    Review of Systems: A 12 point ROS discussed and pertinent positives are indicated in the HPI above.  All other systems are negative.  Review of Systems  Constitutional: Negative for appetite change and fatigue.  Respiratory: Negative for cough and shortness of breath.   Cardiovascular: Negative for chest pain and leg swelling.  Gastrointestinal: Negative for abdominal pain, diarrhea, nausea and vomiting.  Musculoskeletal: Negative for back pain.  Neurological: Positive for weakness. Negative for headaches.       Left-sided weakness; residual from prior stroke.     Vital Signs: 97.8, 153/92, 77 HR, 17 RR, 96% RA   Physical Exam Constitutional:      General: She is not in acute distress. HENT:     Mouth/Throat:     Mouth: Mucous membranes are moist.     Pharynx: Oropharynx is clear.  Cardiovascular:     Rate and Rhythm: Normal rate and regular rhythm.     Pulses: Normal  pulses.     Heart sounds: Normal heart sounds.  Pulmonary:     Effort: Pulmonary effort is normal.     Breath sounds: Normal breath sounds.  Abdominal:     General: Bowel sounds are normal.     Palpations: Abdomen is soft.  Skin:    General: Skin is warm and dry.  Neurological:     Mental Status: She is alert and oriented to person, place, and time.     Motor: Weakness present.     Gait: Gait abnormal.     Comments: Ambulates with a cane and left foot/ankle brace.   Psychiatric:        Mood and Affect: Mood normal.        Behavior: Behavior normal.  Thought Content: Thought content normal.        Judgment: Judgment normal.     Imaging: IR 3D Independent Wkst  Result Date: 11/24/2019 CLINICAL DATA:  History of vertigo and dizziness with neck movement. Abnormal CT angiogram of the head and neck. EXAM: BILATERAL COMMON CAROTID AND INNOMINATE ANGIOGRAPHY COMPARISON:  CT angiogram of the head and neck of Jul 02, 2019. MEDICATIONS: Heparin 1,500 units IV; No antibiotic was administered within 1 hour of the procedure. ANESTHESIA/SEDATION: Versed 1 mg IV; Fentanyl 50 mcg IV Moderate Sedation Time:  60 minutes The patient was continuously monitored during the procedure by the interventional radiology nurse under my direct supervision. CONTRAST:  Isovue 300 approximately 100 mL FLUOROSCOPY TIME:  Fluoroscopy Time: 20 minutes 18 seconds (1429 mGy). COMPLICATIONS: None immediate. TECHNIQUE: Informed written consent was obtained from the patient after a thorough discussion of the procedural risks, benefits and alternatives. All questions were addressed. Maximal Sterile Barrier Technique was utilized including caps, mask, sterile gowns, sterile gloves, sterile drape, hand hygiene and skin antiseptic. A timeout was performed prior to the initiation of the procedure. The right groin was prepped and draped in the usual sterile fashion. Thereafter using modified Seldinger technique, transfemoral  access into the right common femoral artery was obtained without difficulty. Over a 0.035 inch guidewire, a 5 French Pinnacle sheath was inserted. Through this, and also over 0.035 inch guidewire, a 5 [place] JB 1 catheter was advanced to the aortic arch region and selectively positioned in the right common carotid artery, the right subclavian artery, the left common carotid artery and the left vertebral artery. Also performed was a 3D rotational arteriogram of the left anterior circulation via the left common carotid artery injection. The 3D images were developed on a separate workstation. FINDINGS:.: FINDINGS:. The origin of the right vertebral artery is widely patent. The vessel is seen to opacify to the cranial skull base. Mild fusiform dilatation is seen of the right vertebral artery at the level of C1 with two smooth channels noted above the posterior arch of C1 which joined to form the right vertebral artery. This measures approximately 15 mm. More distally the opacified portion of the basilar artery, the posterior cerebral arteries, the superior cerebellar arteries and the anterior-inferior cerebellar arteries opacify into the capillary and venous phases. Unopacified blood is seen in the basilar artery from the contralateral vertebral artery. Arteriograms were also performed with the patient's head turned to the left, and to the right without any evidence of kinking or slowing of hemodynamic ascent of contrast was noted. The patient did not exhibit any clinical symptoms at this time. The right common carotid arteriogram demonstrates the right external carotid artery and its major branches to be widely patent. The right internal carotid artery at the bulb to the cranial skull base demonstrates wide patency. The petrous, the cavernous and the supraclinoid segments are widely patent. The right middle cerebral artery and the right anterior cerebral artery opacify into the capillary and venous phases. The left  common carotid arteriogram demonstrates the left external carotid artery and its major branches to be widely patent. The left internal carotid artery at the bulb to the cranial skull base is widely patent. The petrous, the cavernous and the supraclinoid segments are also widely patent. There is dysplastic fusiform dilatation of the caval cavernous segment of the left internal carotid artery associated with a double density. 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 The left ophthalmic artery is widely patent. The left middle cerebral artery and the left anterior cerebral artery opacify into the capillary and venous phases. The dominant left vertebral artery is widely patent. The vessel has mild tortuosity just distal to its origin. More distally the vessel is seen to opacify to the cranial skull base. There is mild fusiform dilatation of the first horizontal segment of the left vertebral artery, with mild narrowing of the vertical segment and just distal to this. More distally the left vertebrobasilar junction and the left posterior-inferior cerebellar artery are patent. The basilar artery, the posterior cerebral arteries, the superior cerebellar arteries and the anterior-inferior cerebellar arteries opacify into the capillary and venous phases. 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 Relatively smooth arms of the fenestration. Associated mild proximal fusiform dilatation of the right vertebral artery. Approximately 4 mm x 3.5 mm saccular outpouching with wide neck at the level of the left ophthalmic artery consistent with a paraophthalmic aneurysm. Associated fusiform dysplastic changes of  the caval cavernous segment of the left internal carotid artery. Mild FMD-like changes of the left vertebral artery at the skull base. PLAN: Findings were reviewed with the patient. Patient wishes to discuss the angiographic finding in the outpatient clinic accompanied by her husband as soon as possible. Electronically Signed   By: Julieanne Cotton M.D.   On: 11/23/2019 14:57   IR ANGIO INTRA EXTRACRAN SEL COM CAROTID INNOMINATE BILAT MOD SED  Result Date: 11/24/2019 CLINICAL DATA:  History of vertigo and dizziness with neck movement. Abnormal CT angiogram of the head and neck. EXAM: BILATERAL COMMON CAROTID AND INNOMINATE ANGIOGRAPHY COMPARISON:  CT angiogram of the head and neck of Jul 02, 2019. MEDICATIONS: Heparin 1,500 units IV; No antibiotic was administered within 1 hour of the procedure. ANESTHESIA/SEDATION: Versed 1 mg IV; Fentanyl 50 mcg IV Moderate Sedation Time:  60 minutes The patient was continuously monitored during the procedure by the interventional radiology nurse under my direct supervision. CONTRAST:  Isovue 300 approximately 100 mL FLUOROSCOPY TIME:  Fluoroscopy Time: 20 minutes 18 seconds (1429 mGy). COMPLICATIONS: None immediate. TECHNIQUE: Informed written consent was obtained from the patient after a thorough discussion of the procedural risks, benefits and alternatives. All questions were addressed. Maximal Sterile Barrier Technique was utilized including caps, mask, sterile gowns, sterile gloves, sterile drape, hand hygiene and skin antiseptic. A timeout was performed prior to the initiation of the procedure. The right groin was prepped and draped in the usual sterile fashion. Thereafter using modified Seldinger technique, transfemoral access into the right common femoral artery was obtained without difficulty. Over a 0.035 inch guidewire, a 5 French Pinnacle sheath was inserted. Through this, and also over 0.035 inch guidewire, a 5 [place] JB 1 catheter was advanced to the aortic  arch region and selectively positioned in the right common carotid artery, the right subclavian artery, the left common carotid artery and the left vertebral artery. Also performed was a 3D rotational arteriogram of the left anterior circulation via the left common carotid artery injection. The 3D images were developed on a separate workstation. FINDINGS:.: FINDINGS:. The origin of the right vertebral artery is widely patent. The vessel is seen to opacify to the cranial skull base. Mild fusiform dilatation is seen of the right vertebral artery  at the level of C1 with two smooth channels noted above the posterior arch of C1 which joined to form the right vertebral artery. This measures approximately 15 mm. More distally the opacified portion of the basilar artery, the posterior cerebral arteries, the superior cerebellar arteries and the anterior-inferior cerebellar arteries opacify into the capillary and venous phases. Unopacified blood is seen in the basilar artery from the contralateral vertebral artery. Arteriograms were also performed with the patient's head turned to the left, and to the right without any evidence of kinking or slowing of hemodynamic ascent of contrast was noted. The patient did not exhibit any clinical symptoms at this time. The right common carotid arteriogram demonstrates the right external carotid artery and its major branches to be widely patent. The right internal carotid artery at the bulb to the cranial skull base demonstrates wide patency. The petrous, the cavernous and the supraclinoid segments are widely patent. The right middle cerebral artery and the right anterior cerebral artery opacify into the capillary and venous phases. The left common carotid arteriogram demonstrates the left external carotid artery and its major branches to be widely patent. The left internal carotid artery at the bulb to the cranial skull base is widely patent. The petrous, the cavernous and the  supraclinoid segments are also widely patent. There is dysplastic fusiform dilatation of the caval cavernous segment of the left internal carotid artery associated with a double density. 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 The left ophthalmic artery is widely patent. The left middle cerebral artery and the left anterior cerebral artery opacify into the capillary and venous phases. The dominant left vertebral artery is widely patent. The vessel has mild tortuosity just distal to its origin. More distally the vessel is seen to opacify to the cranial skull base. There is mild fusiform dilatation of the first horizontal segment of the left vertebral artery, with mild narrowing of the vertical segment and just distal to this. More distally the left vertebrobasilar junction and the left posterior-inferior cerebellar artery are patent. The basilar artery, the posterior cerebral arteries, the superior cerebellar arteries and the anterior-inferior cerebellar arteries opacify into the capillary and venous phases. 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 Relatively smooth arms of the fenestration. Associated mild proximal fusiform dilatation of the right vertebral artery. Approximately 4 mm x 3.5 mm saccular outpouching with wide neck at the level of the left ophthalmic artery consistent with a paraophthalmic aneurysm. 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 Mild FMD-like changes of the left vertebral artery at the skull base. PLAN: Findings were reviewed with the patient. Patient wishes to discuss the angiographic finding in the  outpatient clinic accompanied by her husband as soon as possible. Electronically Signed   By: Julieanne Cotton M.D.   On: 11/23/2019 14:57   IR ANGIO VERTEBRAL SEL SUBCLAVIAN INNOMINATE UNI R MOD SED  Result Date: 11/24/2019 CLINICAL DATA:  History of vertigo and dizziness with neck movement. Abnormal CT angiogram of the head and neck. EXAM: BILATERAL COMMON CAROTID AND INNOMINATE ANGIOGRAPHY COMPARISON:  CT angiogram of the head and neck of Jul 02, 2019. MEDICATIONS: Heparin 1,500 units IV; No antibiotic was  administered within 1 hour of the procedure. ANESTHESIA/SEDATION: Versed 1 mg IV; Fentanyl 50 mcg IV Moderate Sedation Time:  60 minutes The patient was continuously monitored during the procedure by the interventional radiology nurse under my direct supervision. CONTRAST:  Isovue 300 approximately 100 mL FLUOROSCOPY TIME:  Fluoroscopy Time: 20 minutes 18 seconds (1429 mGy). COMPLICATIONS: None immediate. TECHNIQUE: Informed written consent was obtained from the patient after a thorough discussion of the procedural risks, benefits and alternatives. All questions were addressed. Maximal Sterile Barrier Technique was utilized including caps, mask, sterile gowns, sterile gloves, sterile drape, hand hygiene and skin antiseptic. A timeout was performed prior to the initiation of the procedure. The right groin was prepped and draped in the usual sterile fashion. Thereafter using modified Seldinger technique, transfemoral access into the right common femoral artery was obtained without difficulty. Over a 0.035 inch guidewire, a 5 French Pinnacle sheath was inserted. Through this, and also over 0.035 inch guidewire, a 5 [place] JB 1 catheter was advanced to the aortic arch region and selectively positioned in the right common carotid artery, the right subclavian artery, the left common carotid artery and the left vertebral artery. Also performed was a 3D rotational arteriogram of the left anterior circulation via  the left common carotid artery injection. The 3D images were developed on a separate workstation. FINDINGS:.: FINDINGS:. The origin of the right vertebral artery is widely patent. The vessel is seen to opacify to the cranial skull base. Mild fusiform dilatation is seen of the right vertebral artery at the level of C1 with two smooth channels noted above the posterior arch of C1 which joined to form the right vertebral artery. This measures approximately 15 mm. More distally the opacified portion of the basilar artery, the posterior cerebral arteries, the superior cerebellar arteries and the anterior-inferior cerebellar arteries opacify into the capillary and venous phases. Unopacified blood is seen in the basilar artery from the contralateral vertebral artery. Arteriograms were also performed with the patient's head turned to the left, and to the right without any evidence of kinking or slowing of hemodynamic ascent of contrast was noted. The patient did not exhibit any clinical symptoms at this time. The right common carotid arteriogram demonstrates the right external carotid artery and its major branches to be widely patent. The right internal carotid artery at the bulb to the cranial skull base demonstrates wide patency. The petrous, the cavernous and the supraclinoid segments are widely patent. The right middle cerebral artery and the right anterior cerebral artery opacify into the capillary and venous phases. The left common carotid arteriogram demonstrates the left external carotid artery and its major branches to be widely patent. The left internal carotid artery at the bulb to the cranial skull base is widely patent. The petrous, the cavernous and the supraclinoid segments are also widely patent. There is dysplastic fusiform dilatation of the caval cavernous segment of the left internal carotid artery associated with a double density. 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 The left ophthalmic artery is widely patent. The left middle cerebral artery and the left anterior cerebral artery opacify into the capillary and venous phases. The dominant left vertebral artery is widely patent. The vessel has mild tortuosity just distal to its origin. More distally the vessel is seen to opacify to the cranial skull base. There is  mild fusiform dilatation of the first horizontal segment of the left vertebral artery, with mild narrowing of the vertical segment and just distal to this. More distally the left vertebrobasilar junction and the left posterior-inferior cerebellar artery are patent. The basilar artery, the posterior cerebral arteries, the superior cerebellar arteries and the anterior-inferior cerebellar arteries opacify into the capillary and venous phases. 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 Relatively smooth arms of the fenestration. Associated mild proximal fusiform dilatation of the right vertebral artery. Approximately 4 mm x 3.5 mm saccular outpouching with wide neck at the level of the left ophthalmic artery consistent with a paraophthalmic aneurysm. 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 Mild FMD-like changes of the left vertebral artery at the skull base. PLAN: Findings were reviewed with the patient. Patient wishes to discuss the angiographic finding in the outpatient clinic accompanied by her husband as soon as possible. Electronically Signed   By: Julieanne Cotton M.D.   On: 11/23/2019 14:57   IR ANGIO VERTEBRAL SEL VERTEBRAL UNI L MOD SED  Result Date: 11/24/2019 CLINICAL DATA:  History of vertigo and  dizziness with neck movement. Abnormal CT angiogram of the head and neck. EXAM: BILATERAL COMMON CAROTID AND INNOMINATE ANGIOGRAPHY COMPARISON:  CT angiogram of the head and neck of Jul 02, 2019. MEDICATIONS: Heparin 1,500 units IV; No antibiotic was administered within 1 hour of the procedure. ANESTHESIA/SEDATION: Versed 1 mg IV; Fentanyl 50 mcg IV Moderate Sedation Time:  60 minutes The patient was continuously monitored during the procedure by the interventional radiology nurse under my direct supervision. CONTRAST:  Isovue 300 approximately 100 mL FLUOROSCOPY TIME:  Fluoroscopy Time: 20 minutes 18 seconds (1429 mGy). COMPLICATIONS: None immediate. TECHNIQUE: Informed written consent was obtained from the patient after a thorough discussion of the procedural risks, benefits and alternatives. All questions were addressed. Maximal Sterile Barrier Technique was utilized including caps, mask, sterile gowns, sterile gloves, sterile drape, hand hygiene and skin antiseptic. A timeout was performed prior to the initiation of the procedure. The right groin was prepped and draped in the usual sterile fashion. Thereafter using modified Seldinger technique, transfemoral access into the right common femoral artery was obtained without difficulty. Over a 0.035 inch guidewire, a 5 French Pinnacle sheath was inserted. Through this, and also over 0.035 inch guidewire, a 5 [place] JB 1 catheter was advanced to the aortic arch region and selectively positioned in the right common carotid artery, the right subclavian artery, the left common carotid artery and the left vertebral artery. Also performed was a 3D rotational arteriogram of the left anterior circulation via the left common carotid artery injection. The 3D images were developed on a separate workstation. FINDINGS:.: FINDINGS:. The origin of the right vertebral artery is widely patent. The vessel is seen to opacify to the cranial skull base. Mild fusiform dilatation is seen  of the right vertebral artery at the level of C1 with two smooth channels noted above the posterior arch of C1 which joined to form the right vertebral artery. This measures approximately 15 mm. More distally the opacified portion of the basilar artery, the posterior cerebral arteries, the superior cerebellar arteries and the anterior-inferior cerebellar arteries opacify into the capillary and venous phases. Unopacified blood is seen in the basilar artery from the contralateral vertebral artery. Arteriograms were also performed with the  patient's head turned to the left, and to the right without any evidence of kinking or slowing of hemodynamic ascent of contrast was noted. The patient did not exhibit any clinical symptoms at this time. The right common carotid arteriogram demonstrates the right external carotid artery and its major branches to be widely patent. The right internal carotid artery at the bulb to the cranial skull base demonstrates wide patency. The petrous, the cavernous and the supraclinoid segments are widely patent. The right middle cerebral artery and the right anterior cerebral artery opacify into the capillary and venous phases. The left common carotid arteriogram demonstrates the left external carotid artery and its major branches to be widely patent. The left internal carotid artery at the bulb to the cranial skull base is widely patent. The petrous, the cavernous and the supraclinoid segments are also widely patent. There is dysplastic fusiform dilatation of the caval cavernous segment of the left internal carotid artery associated with a double density. 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 The left ophthalmic artery is widely patent. The left middle cerebral artery and the left anterior cerebral  artery opacify into the capillary and venous phases. The dominant left vertebral artery is widely patent. The vessel has mild tortuosity just distal to its origin. More distally the vessel is seen to opacify to the cranial skull base. There is mild fusiform dilatation of the first horizontal segment of the left vertebral artery, with mild narrowing of the vertical segment and just distal to this. More distally the left vertebrobasilar junction and the left posterior-inferior cerebellar artery are patent. The basilar artery, the posterior cerebral arteries, the superior cerebellar arteries and the anterior-inferior cerebellar arteries opacify into the capillary and venous phases. 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 Relatively smooth arms of the fenestration. Associated mild proximal fusiform dilatation of the right vertebral artery. Approximately 4 mm x 3.5 mm saccular outpouching with wide neck at the level of the left ophthalmic artery consistent with a paraophthalmic aneurysm. 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 Mild FMD-like changes of the left vertebral artery at the skull base. PLAN: Findings were reviewed with the patient. Patient wishes to discuss the angiographic finding in the outpatient clinic accompanied by her husband as soon as possible. Electronically Signed   By: Julieanne Cotton M.D.   On: 11/23/2019 14:57    Labs:  CBC: Recent Labs    07/02/19 1110 07/02/19 1140 11/23/19 0854 12/06/19 0658  WBC 5.9  --  9.3 6.9  HGB 15.4* 16.0* 15.4* 15.0  HCT 46.2* 47.0* 45.6 45.2  PLT 391  --  354 341    COAGS: Recent Labs    07/02/19 1110 11/23/19 0854 12/06/19 0658  INR 1.0 0.9 1.0  APTT 36 34 36    BMP: Recent Labs    07/02/19 1110 07/02/19 1140 11/23/19 0854  12/06/19 0658  NA 139 142 138 140  K 3.7 3.6 4.4 3.1*  CL 107 107 106 106  CO2 21*  --  21* 23  GLUCOSE 90 90 90 103*  BUN 5* 5* 11 7  CALCIUM 9.4  --  8.9 9.1  CREATININE 0.54 0.50 0.58  0.53  GFRNONAA >60  --  >60 >60  GFRAA >60  --   --   --     LIVER FUNCTION TESTS: Recent Labs    07/02/19 1110  BILITOT 1.4*  AST 28  ALT 32  ALKPHOS 79  PROT 7.7  ALBUMIN 4.3    TUMOR MARKERS: No results for input(s): AFPTM, CEA, CA199, CHROMGRNA in the last 8760 hours.  Assessment and Plan:  Left paraophthalmic aneurysm: Kimberly [name], 54 year old female, [facility] for an image-guided cerebral angiogram with intervention. This procedure will be done under general anesthesia and the patient will be admitted for overnight observation.   Risks and benefits of this procedure were discussed with the patient including, but not limited to bleeding, infection, vascular injury or contrast induced renal failure.  This interventional procedure involves the use of X-rays and because of the nature of the planned procedure, it is possible that we will have prolonged use of X-ray fluoroscopy.  Potential radiation risks to you include (but are not limited to) the following: - A slightly elevated risk for cancer  several years later in life. This risk is typically less than 0.5% percent. This risk is low in comparison to the normal incidence of human cancer, which is 33% for women and 50% for men according to the [employer] Society. - Radiation induced injury can include skin redness, resembling a rash, tissue breakdown / ulcers and hair loss (which can be temporary or permanent).   The likelihood of either of these occurring depends on the difficulty of the procedure and whether you are sensitive to radiation due to previous procedures, disease, or genetic conditions.   IF your procedure requires a prolonged use of radiation, you  will be notified and given written instructions for further action.  It is your responsibility to monitor the irradiated area for the 2 weeks following the procedure and to notify your physician if you are concerned that you have suffered a radiation induced injury.    All of the patient's questions were answered, patient is agreeable to proceed.  Consent signed and in chart.  Thank you for this interesting consult.  I greatly enjoyed meeting Kimberly Hoppingracy Seckel and look forward to participating in their care.  A copy of this report was sent to the requesting provider on this date.  Electronically Signed: Alwyn RenJamie Lupe Bonner, AGACNP-BC [phone] 12/06/2019, 7:59 AM   I spent a total of  30 Minutes   in face to face in clinical consultation, greater than 50% of which was counseling/coordinating care for cerebral angiogram with intervention.

## 2019-12-06 NOTE — Progress Notes (Signed)
ANTICOAGULATION CONSULT NOTE - Initial Consult  Pharmacy Consult for heparin Indication: Post Interventional Neuroradiology Procedure  No Known Allergies  Patient Measurements: Height: 5\' 4"  (162.6 cm) Weight: 93.4 kg (206 lb) IBW/kg (Calculated) : 54.7 Heparin Dosing Weight: 75.9 kg   Vital Signs: Temp: 98.5 F (36.9 C) (11/01 1445) Temp Source: Oral (11/01 0647) BP: 131/92 (11/01 1445) Pulse Rate: 93 (11/01 1445)  Labs: Recent Labs    12/06/19 0658  HGB 15.0  HCT 45.2  PLT 341  APTT 36  LABPROT 12.7  INR 1.0  CREATININE 0.53    Estimated Creatinine Clearance: 89.1 mL/min (by C-G formula based on SCr of 0.53 mg/dL).   Medical History: Past Medical History:  Diagnosis Date  . Anxiety   . COPD (chronic obstructive pulmonary disease) (HCC)    mild - no inhaler  . Dizziness   . Fibromyalgia   . Headache   . HLD (hyperlipidemia)   . Hypertension   . IBS (irritable bowel syndrome)   . Insomnia   . Stroke (HCC) 06/27/2019    Medications:  Scheduled:  . [START ON 12/07/2019] aspirin  81 mg Oral Daily   Or  . [START ON 12/07/2019] aspirin  81 mg Per Tube Daily  . [START ON 12/07/2019] atorvastatin  80 mg Oral Daily  . Chlorhexidine Gluconate Cloth  6 each Topical Daily  . [START ON 12/07/2019] clopidogrel  75 mg Oral Daily   Or  . [START ON 12/07/2019] clopidogrel  75 mg Per Tube Daily  . nitroGLYCERIN        Assessment: 67 yof who underwent cerebral arteriogram for embolization of Lt ICA paraophthalmic aneurysm. No AC PTA.    Hgb 15, plt 341. No s/sx of bleeding documented. Was started at 500 units/hr on 11/1@1329 .   Goal of Therapy:  Heparin level 0.1-0.25 units/ml Monitor platelets by anticoagulation protocol: Yes   Plan:  Continue heparin infusion at 500 units/hr Order heparin level in 6 hours Monitor s/sx of bleeding, CBC, and HL   [id number], PharmD, BCCCP Clinical Pharmacist  Phone: [phone] 12/06/2019 3:24 PM  Please check AMION for all  [facility] phone numbers After 10:00 PM, call Main Pharmacy [phone]

## 2019-12-06 NOTE — Anesthesia Procedure Notes (Signed)
Arterial Line Insertion Start/End11/02/2019 9:05 AM, 12/06/2019 9:11 AM Performed by: Lucretia Kern, MD, anesthesiologist  Preanesthetic checklist: patient identified, risks and benefits discussed, surgical consent and pre-op evaluation Lidocaine 1% used for infiltration radial was placed Catheter size: 20 G Hand hygiene performed  and Seldinger technique used  Attempts: 1 Procedure performed using ultrasound guided technique. Ultrasound Notes:image(s) printed for medical record Following insertion, dressing applied and Biopatch. Post procedure assessment: normal  Patient tolerated the procedure well with no immediate complications.

## 2019-12-06 NOTE — Progress Notes (Signed)
Device deployed per Dr. Corliss Skains, [facility]

## 2019-12-06 NOTE — Progress Notes (Signed)
ANTICOAGULATION CONSULT NOTE - Follow Up Consult  Pharmacy Consult for Heparin Indication: Post Interventional Neuroradiology Procedure  No Known Allergies  Patient Measurements: Height: 5\' 4"  (162.6 cm) (husband says 5'6") Weight: 95.2 kg (209 lb 14.1 oz) IBW/kg (Calculated) : 54.7 Heparin Dosing Weight: 75.9 kg   Vital Signs: Temp: 98.8 F (37.1 C) (11/01 2000) Temp Source: Oral (11/01 2000) BP: 113/72 (11/01 2000) Pulse Rate: 114 (11/01 2000)  Labs: Recent Labs    12/06/19 0658 12/06/19 2000  HGB 15.0  --   HCT 45.2  --   PLT 341  --   APTT 36  --   LABPROT 12.7  --   INR 1.0  --   HEPARINUNFRC  --  0.22*  CREATININE 0.53  --     Estimated Creatinine Clearance: 90 mL/min (by C-G formula based on SCr of 0.53 mg/dL).   Medical History: Past Medical History:  Diagnosis Date  . Anxiety   . COPD (chronic obstructive pulmonary disease) (HCC)    mild - no inhaler  . Dizziness   . Fibromyalgia   . Headache   . HLD (hyperlipidemia)   . Hypertension   . IBS (irritable bowel syndrome)   . Insomnia   . Stroke [facility]) 06/27/2019    Assessment: 54 yr old female underwent cerebral arteriogram for embolization  ICA paraophthalmic aneurysm. Pharmacy was consulted for heparin post procedure; pt was on no anticoagulants PTA.   Pt was started on heparin infusion at 500 units/hr today at ~1330 PM. Heparin level drawn ~6.5 hrs after infusion started was 0.22 units/ml, which is within the goal range for this pt. H/H, platelets WNL. Per RN, no issues with IV or bleeding observed.  Goal of Therapy:  Heparin level 0.1-0.25 units/ml Monitor platelets by anticoagulation protocol: Yes   Plan:  Continue heparin infusion at 500 units/hr (heparin to be turned off at 0800 tomorrow, 11/2) Check confirmatory heparin level in 6 hrs Monitor daily heparin level, CBC while on heparin Monitor for signs/symptoms of bleeding  13/2, PharmD, BCPS, [facility] Clinical  Pharmacist 12/06/2019 8:46 PM

## 2019-12-06 NOTE — Anesthesia Procedure Notes (Signed)
Procedure Name: Intubation Date/Time: 12/06/2019 9:52 AM Performed by: Lidia Collum, MD Pre-anesthesia Checklist: Patient identified, Emergency Drugs available, Suction available and Patient being monitored Patient Re-evaluated:Patient Re-evaluated prior to induction Oxygen Delivery Method: Circle system utilized Preoxygenation: Pre-oxygenation with 100% oxygen Induction Type: IV induction Ventilation: Mask ventilation without difficulty and Oral airway inserted - appropriate to patient size Laryngoscope Size: Glidescope and 3 Grade View: Grade I Tube type: Oral Tube size: 7.0 mm Number of attempts: 1 Airway Equipment and Method: Stylet and Oral airway Placement Confirmation: ETT inserted through vocal cords under direct vision,  positive ETCO2 and breath sounds checked- equal and bilateral Secured at: 23 cm Tube secured with: Tape Dental Injury: Teeth and Oropharynx as per pre-operative assessment  Difficulty Due To: Difficulty was unanticipated and Difficult Airway- due to anterior larynx Comments: Limited view with MAC 4 and Miller 2. Grade 1 view achieved with glidescope S3 by Dr. Christella Hartigan

## 2019-12-06 NOTE — Plan of Care (Signed)
Resp e/u, SpO2 98% on RA.

## 2019-12-06 NOTE — Progress Notes (Signed)
NIR.  Patient underwent an image-guided cerebral arteriogram with embolization of left ICA paraophthalmic aneurysm using an Evolve flow diverter device via right femoral approach today by Dr. Corliss Skains.  Patient evaluated bedside in neuro ICU alongside Dr. Corliss Skains following procedure. Patient awake and alert laying in bed with no complaints at this time. Denies headache, weakness, dizziness, vision changes, N/V.  Alert, awake, and oriented x3. Speech and comprehension intact. PERRL bilaterally. EOMs intact bilaterally without nystagmus or subjective diplopia. No facial asymmetry. Tongue midline. Can spontaneously move all extremities. No pronator drift. Fine motor and coordination intact and symmetric. Distal pulses (DPs) 2+ bilaterally. Right femoral puncture site soft without active bleeding or hematoma.  Plan to stay in neuro ICU for overnight observation. SBP goal = 120-140 systolic. Advance diet as tolerated. Right NIR to follow.   Kimberly Boga Adrionna Delcid, PA-C 12/06/2019, 3:53 PM

## 2019-12-07 ENCOUNTER — Encounter (HOSPITAL_COMMUNITY): Payer: Self-pay | Admitting: Interventional Radiology

## 2019-12-07 LAB — BASIC METABOLIC PANEL
Anion gap: 11 (ref 5–15)
BUN: 5 mg/dL — ABNORMAL LOW (ref 6–20)
CO2: 22 mmol/L (ref 22–32)
Calcium: 8.9 mg/dL (ref 8.9–10.3)
Chloride: 109 mmol/L (ref 98–111)
Creatinine, Ser: 0.58 mg/dL (ref 0.44–1.00)
GFR, Estimated: >60 mL/min (ref >60)
Glucose, Bld: 140 mg/dL — ABNORMAL HIGH (ref 70–99)
Potassium: 3.3 mmol/L — ABNORMAL LOW (ref 3.5–5.1)
Sodium: 142 mmol/L (ref 135–145)

## 2019-12-07 LAB — CBC WITH DIFFERENTIAL/PLATELET
Abs Immature Granulocytes: 0.06 10*3/uL (ref 0.00–0.07)
Basophils Absolute: 0 10*3/uL (ref 0.0–0.1)
Basophils Relative: 0 %
Eosinophils Absolute: 0 10*3/uL (ref 0.0–0.5)
Eosinophils Relative: 0 %
HCT: 40 % (ref 36.0–46.0)
Hemoglobin: 13.2 g/dL (ref 12.0–15.0)
Immature Granulocytes: 1 %
Lymphocytes Relative: 12 %
Lymphs Abs: 1.4 10*3/uL (ref 0.7–4.0)
MCH: 30.1 pg (ref 26.0–34.0)
MCHC: 33 g/dL (ref 30.0–36.0)
MCV: 91.3 fL (ref 80.0–100.0)
Monocytes Absolute: 0.9 10*3/uL (ref 0.1–1.0)
Monocytes Relative: 7 %
Neutro Abs: 9.7 10*3/uL — ABNORMAL HIGH (ref 1.7–7.7)
Neutrophils Relative %: 80 %
Platelets: 327 10*3/uL (ref 150–400)
RBC: 4.38 MIL/uL (ref 3.87–5.11)
RDW: 11.9 % (ref 11.5–15.5)
WBC: 12 10*3/uL — ABNORMAL HIGH (ref 4.0–10.5)
nRBC: 0 % (ref 0.0–0.2)

## 2019-12-07 LAB — HEPARIN LEVEL (UNFRACTIONATED): Heparin Unfractionated: <0.1 IU/mL — ABNORMAL LOW (ref 0.30–0.70)

## 2019-12-07 LAB — PLATELET INHIBITION P2Y12: Platelet Function  P2Y12: 107 [PRU] — ABNORMAL LOW (ref 182–335)

## 2019-12-07 MED ORDER — ASPIRIN 81 MG PO TBEC: 325.0000 mg | DELAYED_RELEASE_TABLET | Freq: Every day | ORAL | 3 refills | Status: AC

## 2019-12-07 MED ORDER — CLEVIDIPINE BUTYRATE 0.5 MG/ML IV EMUL: 0.0000 mg/h | INTRAVENOUS | Status: DC

## 2019-12-07 MED ORDER — POTASSIUM CHLORIDE CRYS ER 20 MEQ PO TBCR
40.0000 meq | EXTENDED_RELEASE_TABLET | Freq: Once | ORAL | Status: AC
  Administered 2019-12-07: 40 meq via ORAL
  Filled 2019-12-07: qty 2

## 2019-12-07 NOTE — Plan of Care (Signed)
Patient is neuro intact and ambulatory at baseline. Tolerating diet. Plan of care reviewed with all questions answered.

## 2019-12-07 NOTE — Progress Notes (Signed)
AVS reviewed with patient. All questions answered and confirmed all belongings present upon discharge. Patient at baseline and able to ambulate self with use of cane. Patient was assisted to car via wheelchair.

## 2019-12-07 NOTE — Discharge Instructions (Signed)
Endovascular Therapy for Cerebral Aneurysm, Care After This sheet gives you information about how to care for yourself after your procedure. Your health care provider may also give you more specific instructions. If you have problems or questions, contact your health care provider. What can I expect after the procedure? After the procedure, it is common to have:  Pain, tenderness, and swelling around your incision.  Headaches. Follow these instructions at home: Medicines  Continue taking Plavix 75 mg once daily.  Begin taking Aspirin 325 mg once daily. Incision care  Follow instructions from your health care provider about how to take care of your incision. Make sure you: ? Wash your hands with soap and water before you change your bandage (dressing). If soap and water are not available, use hand sanitizer. ? Ok to remove dressing from right groin 24 hours after procedure. No further dressing changes needed after this- ensure area remains clean/dry until fully healed.  Check your incision area every day for signs of infection. Check for: ? Redness, swelling, or pain. ? Fluid or blood. ? Warmth. ? Pus or a bad smell. Activity  Do not drive self until 2 week follow-up appointment.  Do not stoop, bend, or lift anything that is heavier than 10 lb (4.5 kg) until 2 week follow-up appointment. Eating and drinking  Drink enough fluid to keep your urine pale yellow.  Eat a healthy diet. This includes plenty of fruits and vegetables, whole grains, low-fat dairy products, and lean protein. General instructions  Do not use any products that contain nicotine or tobacco, such as cigarettes and e-cigarettes. If you need help quitting, ask your health care provider.  Do not take baths, swim, or use a hot tub for 7 days post-procedure. Ok to shower 24 hours post-procedure.  Manage your stress. If you need help with this, talk with your health care provider.  Keep all follow-up visits as  told by your health care provider. This is important. Contact a health care provider if:  You have redness, swelling, or pain around your incision.  You have fluid or blood coming from your incision.  Your incision feels warm to the touch.  You have pus or a bad smell coming from your incision.  You have a fever. Get help right away if:  You have: ? Stiffness in your neck. ? Pain, numbness, weakness, or swelling in your legs. ? Severe chest pain. ? Difficulty breathing. ? Confusion.  You have any symptoms of stroke. "BE FAST" is an easy way to remember the main warning signs of stroke: ? B - Balance. Signs are dizziness, sudden trouble walking, or loss of balance. ? E - Eyes. Signs are trouble seeing or a sudden change in vision. ? F - Face. Signs are sudden weakness or numbness of the face, or the face or eyelid drooping on one side. ? A - Arms. Signs are weakness or numbness in an arm. This happens suddenly and usually on one side of the body. ? S - Speech. Signs are sudden trouble speaking, slurred speech, or trouble understanding what people say. ? T - Time. Time to call emergency services. Write down what time symptoms started.  You have other signs of stroke, such as: ? A sudden, severe headache that does not get better with medicine. ? Sudden nausea or vomiting. ? A seizure. These symptoms may represent a serious problem that is an emergency. Do not wait to see if the symptoms will go away. Get medical help right  away. Call your local emergency services (911 in the [place] Do not drive yourself to the hospital. This information is not intended to replace advice given to you by your health care provider. Make sure you discuss any questions you have with your health care provider. Document Revised: 03/13/2018 Document Reviewed: 04/29/2016 Elsevier Patient Education  2020 [employer].

## 2019-12-07 NOTE — Discharge Summary (Signed)
Patient ID: Kimberly [name] MRN: [id number] DOB/AGE: 1965/02/11 54 y.o.  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 Corliss Skainseveshwar. Procedure occurred without major complications and patient was transferred to neuro ICU in stable condition (VSS, right femoral puncture site stable) for overnight observation. No major events occurred overnight.  Patient awake and alert sitting in bed with no complaints at this time. Denies headache, weakness, or vision changes. Right femoral puncture site stable. Plan to discharge home today and follow-up with Dr. Corliss Skainseveshwar in clinic 2 weeks after discharge.   Consults: None  Significant Diagnostic Studies: IR 3D Independent Wkst  Result Date: 11/24/2019 CLINICAL DATA:  History of vertigo and dizziness with neck movement. Abnormal CT angiogram of the head and neck. EXAM: BILATERAL COMMON CAROTID AND INNOMINATE ANGIOGRAPHY COMPARISON:  CT angiogram of the head and neck of Jul 02, 2019. MEDICATIONS: Heparin 1,500 units IV; No antibiotic was administered within 1 hour of the procedure. ANESTHESIA/SEDATION: Versed 1 mg IV; Fentanyl 50 mcg IV Moderate Sedation Time:  60 minutes The patient was continuously monitored during the procedure by the interventional radiology nurse under my direct supervision. CONTRAST:  Isovue 300 approximately 100 mL FLUOROSCOPY TIME:  Fluoroscopy Time: 20 minutes 18 seconds (1429 mGy). COMPLICATIONS: None immediate. TECHNIQUE: Informed written consent was obtained from the patient after a thorough discussion of  the procedural risks, benefits and alternatives. All questions were addressed. Maximal Sterile Barrier Technique was utilized including caps, mask, sterile gowns, sterile gloves, sterile drape, hand hygiene and skin antiseptic. A timeout was performed prior to the initiation of the procedure. The right groin was prepped and draped in the usual sterile fashion. Thereafter using modified Seldinger technique, transfemoral access into the right common femoral artery was obtained without difficulty. Over a 0.035 inch guidewire, a 5 French Pinnacle sheath was inserted. Through this, and also over 0.035 inch guidewire, a 5 [place] JB 1 catheter was advanced to the aortic arch region and selectively positioned in the right common carotid artery, the right subclavian artery, the left common carotid artery and the left vertebral artery. Also performed was a 3D rotational arteriogram of the left anterior circulation via the left common carotid artery injection. The 3D images were developed on a separate workstation. FINDINGS:.: FINDINGS:. The origin of the right vertebral artery is widely patent. The vessel is seen to opacify to the cranial skull base. Mild fusiform dilatation is seen of the right vertebral artery at the level of C1 with two smooth channels noted above the posterior arch of C1 which joined to form the right vertebral artery. This measures approximately 15 mm. More distally the opacified portion of the basilar artery, the posterior cerebral arteries, the superior cerebellar arteries and the anterior-inferior cerebellar arteries opacify into the capillary and venous phases. Unopacified blood is seen in the basilar artery from the contralateral vertebral artery. Arteriograms were also performed with the patient's head turned to the left, and to the right without any evidence of kinking or slowing of hemodynamic ascent of contrast was noted. The patient did not exhibit any clinical symptoms at this time. The right  common carotid arteriogram demonstrates the right external  carotid artery and its major branches to be widely patent. The right internal carotid artery at the bulb to the cranial skull base demonstrates wide patency. The petrous, the cavernous and the supraclinoid segments are widely patent. The right middle cerebral artery and the right anterior cerebral artery opacify into the capillary and venous phases. The left common carotid arteriogram demonstrates the left external carotid artery and its major branches to be widely patent. The left internal carotid artery at the bulb to the cranial skull base is widely patent. The petrous, the cavernous and the supraclinoid segments are also widely patent. There is dysplastic fusiform dilatation of the caval cavernous segment of the left internal carotid artery associated with a double density. 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 The left ophthalmic artery is widely patent. The left middle cerebral artery and the left anterior cerebral artery opacify into the capillary and venous phases. The dominant left vertebral artery is widely patent. The vessel has mild tortuosity just distal to its origin. More distally the vessel is seen to opacify to the cranial skull base. There is mild fusiform dilatation of the first horizontal segment of the left vertebral artery, with mild narrowing of the vertical segment and just distal to this. More distally the left vertebrobasilar junction and the left posterior-inferior cerebellar artery are patent. The basilar artery, the posterior cerebral arteries, the superior cerebellar arteries and the anterior-inferior cerebellar arteries opacify into the capillary and venous phases. 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 Relatively smooth arms of the fenestration. Associated mild proximal fusiform dilatation of the right vertebral artery. Approximately 4 mm x 3.5 mm saccular outpouching with wide neck at the level of the left ophthalmic artery consistent with a paraophthalmic aneurysm. 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 Mild FMD-like changes of the left vertebral artery at the skull base. PLAN: Findings were reviewed with the patient. Patient wishes to discuss the angiographic finding in the outpatient clinic accompanied by her husband as soon as possible. Electronically Signed   By: Julieanne Cotton M.D.   On: 11/23/2019 14:57   IR ANGIO INTRA EXTRACRAN SEL COM CAROTID INNOMINATE BILAT MOD SED  Result Date: 11/24/2019 CLINICAL DATA:  History of vertigo and dizziness with neck movement. Abnormal CT angiogram of the head and neck. EXAM: BILATERAL COMMON CAROTID AND INNOMINATE ANGIOGRAPHY COMPARISON:  CT angiogram of the head and neck of Jul 02, 2019. MEDICATIONS: Heparin 1,500 units IV; No antibiotic was administered within 1 hour of the procedure. ANESTHESIA/SEDATION: Versed 1 mg IV; Fentanyl 50 mcg IV Moderate Sedation Time:  60 minutes The patient was continuously monitored during the procedure by the interventional radiology nurse under my direct supervision. CONTRAST:  Isovue 300 approximately 100 mL FLUOROSCOPY TIME:  Fluoroscopy Time: 20 minutes 18 seconds (1429 mGy). COMPLICATIONS: None immediate. TECHNIQUE: Informed written consent was obtained from the patient after a thorough discussion of the procedural risks, benefits and alternatives. All questions were addressed. Maximal Sterile Barrier Technique was utilized including caps, mask, sterile gowns, sterile gloves, sterile drape, hand hygiene and skin antiseptic. A timeout was performed  prior to the initiation of the procedure. The right groin was prepped  and draped in the usual sterile fashion. Thereafter using modified Seldinger technique, transfemoral access into the right common femoral artery was obtained without difficulty. Over a 0.035 inch guidewire, a 5 French Pinnacle sheath was inserted. Through this, and also over 0.035 inch guidewire, a 5 [place] JB 1 catheter was advanced to the aortic arch region and selectively positioned in the right common carotid artery, the right subclavian artery, the left common carotid artery and the left vertebral artery. Also performed was a 3D rotational arteriogram of the left anterior circulation via the left common carotid artery injection. The 3D images were developed on a separate workstation. FINDINGS:.: FINDINGS:. The origin of the right vertebral artery is widely patent. The vessel is seen to opacify to the cranial skull base. Mild fusiform dilatation is seen of the right vertebral artery at the level of C1 with two smooth channels noted above the posterior arch of C1 which joined to form the right vertebral artery. This measures approximately 15 mm. More distally the opacified portion of the basilar artery, the posterior cerebral arteries, the superior cerebellar arteries and the anterior-inferior cerebellar arteries opacify into the capillary and venous phases. Unopacified blood is seen in the basilar artery from the contralateral vertebral artery. Arteriograms were also performed with the patient's head turned to the left, and to the right without any evidence of kinking or slowing of hemodynamic ascent of contrast was noted. The patient did not exhibit any clinical symptoms at this time. The right common carotid arteriogram demonstrates the right external carotid artery and its major branches to be widely patent. The right internal carotid artery at the bulb to the cranial skull base demonstrates wide patency. The petrous, the cavernous and the  supraclinoid segments are widely patent. The right middle cerebral artery and the right anterior cerebral artery opacify into the capillary and venous phases. The left common carotid arteriogram demonstrates the left external carotid artery and its major branches to be widely patent. The left internal carotid artery at the bulb to the cranial skull base is widely patent. The petrous, the cavernous and the supraclinoid segments are also widely patent. There is dysplastic fusiform dilatation of the caval cavernous segment of the left internal carotid artery associated with a double density. 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 The left ophthalmic artery is widely patent. The left middle cerebral artery and the left anterior cerebral artery opacify into the capillary and venous phases. The dominant left vertebral artery is widely patent. The vessel has mild tortuosity just distal to its origin. More distally the vessel is seen to opacify to the cranial skull base. There is mild fusiform dilatation of the first horizontal segment of the left vertebral artery, with mild narrowing of the vertical segment and just distal to this. More distally the left vertebrobasilar junction and the left posterior-inferior cerebellar artery are patent. The basilar artery, the posterior cerebral arteries, the superior cerebellar arteries and the anterior-inferior cerebellar arteries opacify into the capillary and venous phases. 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 Relatively smooth arms of the fenestration. Associated mild proximal fusiform dilatation of the right vertebral artery. Approximately 4 mm x 3.5  mm saccular outpouching with wide neck at the  level of the left ophthalmic artery consistent with a paraophthalmic aneurysm. 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 Mild FMD-like changes of the left vertebral artery at the skull base. PLAN: Findings were reviewed with the patient. Patient wishes to discuss the angiographic finding in the outpatient clinic accompanied by her husband as soon as possible. Electronically Signed   By: Julieanne Cotton M.D.   On: 11/23/2019 14:57   IR ANGIO VERTEBRAL SEL SUBCLAVIAN INNOMINATE UNI R MOD SED  Result Date: 11/24/2019 CLINICAL DATA:  History of vertigo and dizziness with neck movement. Abnormal CT angiogram of the head and neck. EXAM: BILATERAL COMMON CAROTID AND INNOMINATE ANGIOGRAPHY COMPARISON:  CT angiogram of the head and neck of Jul 02, 2019. MEDICATIONS: Heparin 1,500 units IV; No antibiotic was administered within 1 hour of the procedure. ANESTHESIA/SEDATION: Versed 1 mg IV; Fentanyl 50 mcg IV Moderate Sedation Time:  60 minutes The patient was continuously monitored during the procedure by the interventional radiology nurse under my direct supervision. CONTRAST:  Isovue 300 approximately 100 mL FLUOROSCOPY TIME:  Fluoroscopy Time: 20 minutes 18 seconds (1429 mGy). COMPLICATIONS: None immediate. TECHNIQUE: Informed written consent was obtained from the patient after a thorough discussion of the procedural risks, benefits and alternatives. All questions were addressed. Maximal Sterile Barrier Technique was utilized including caps, mask, sterile gowns, sterile gloves, sterile drape, hand hygiene and skin antiseptic. A timeout was performed prior to the initiation of the procedure. The right groin was prepped and draped in the usual sterile fashion. Thereafter using modified Seldinger technique, transfemoral access into the right common femoral artery was obtained without difficulty. Over a 0.035  inch guidewire, a 5 French Pinnacle sheath was inserted. Through this, and also over 0.035 inch guidewire, a 5 [place] JB 1 catheter was advanced to the aortic arch region and selectively positioned in the right common carotid artery, the right subclavian artery, the left common carotid artery and the left vertebral artery. Also performed was a 3D rotational arteriogram of the left anterior circulation via the left common carotid artery injection. The 3D images were developed on a separate workstation. FINDINGS:.: FINDINGS:. The origin of the right vertebral artery is widely patent. The vessel is seen to opacify to the cranial skull base. Mild fusiform dilatation is seen of the right vertebral artery at the level of C1 with two smooth channels noted above the posterior arch of C1 which joined to form the right vertebral artery. This measures approximately 15 mm. More distally the opacified portion of the basilar artery, the posterior cerebral arteries, the superior cerebellar arteries and the anterior-inferior cerebellar arteries opacify into the capillary and venous phases. Unopacified blood is seen in the basilar artery from the contralateral vertebral artery. Arteriograms were also performed with the patient's head turned to the left, and to the right without any evidence of kinking or slowing of hemodynamic ascent of contrast was noted. The patient did not exhibit any clinical symptoms at this time. The right common carotid arteriogram demonstrates the right external carotid artery and its major branches to be widely patent. The right internal carotid artery at the bulb to the cranial skull base demonstrates wide patency. The petrous, the cavernous and the supraclinoid segments are widely patent. The right middle cerebral artery and the right anterior cerebral artery opacify into the capillary and venous phases. The left common carotid arteriogram demonstrates the left external carotid artery and its major  branches to be widely patent. The  left internal carotid artery at the bulb to the cranial skull base is widely patent. The petrous, the cavernous and the supraclinoid segments are also widely patent. There is dysplastic fusiform dilatation of the caval cavernous segment of the left internal carotid artery associated with a double density. 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 The left ophthalmic artery is widely patent. The left middle cerebral artery and the left anterior cerebral artery opacify into the capillary and venous phases. The dominant left vertebral artery is widely patent. The vessel has mild tortuosity just distal to its origin. More distally the vessel is seen to opacify to the cranial skull base. There is mild fusiform dilatation of the first horizontal segment of the left vertebral artery, with mild narrowing of the vertical segment and just distal to this. More distally the left vertebrobasilar junction and the left posterior-inferior cerebellar artery are patent. The basilar artery, the posterior cerebral arteries, the superior cerebellar arteries and the anterior-inferior cerebellar arteries opacify into the capillary and venous phases. 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 Relatively smooth arms of the fenestration. Associated mild proximal fusiform dilatation of the right vertebral artery. Approximately 4 mm x 3.5 mm saccular outpouching with wide neck at the level of the left ophthalmic artery consistent with a paraophthalmic aneurysm. 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 Mild FMD-like changes of  the left vertebral artery at the skull base. PLAN: Findings were reviewed with the patient. Patient wishes to discuss the angiographic finding in the outpatient clinic accompanied by her husband as soon as possible. Electronically Signed   By: Julieanne Cotton M.D.   On: 11/23/2019 14:57   IR ANGIO VERTEBRAL SEL VERTEBRAL UNI L MOD SED  Result Date: 11/24/2019 CLINICAL DATA:  History of vertigo and dizziness with neck movement. Abnormal CT angiogram of the head and neck. EXAM: BILATERAL COMMON CAROTID AND INNOMINATE ANGIOGRAPHY COMPARISON:  CT angiogram of the head and neck of Jul 02, 2019. MEDICATIONS: Heparin 1,500 units IV; No antibiotic was administered within 1 hour of the procedure. ANESTHESIA/SEDATION: Versed 1 mg IV; Fentanyl 50 mcg IV Moderate Sedation Time:  60 minutes The patient was continuously monitored during the procedure by the interventional radiology nurse under my direct supervision. CONTRAST:  Isovue 300 approximately 100 mL FLUOROSCOPY TIME:  Fluoroscopy Time: 20 minutes 18 seconds (1429 mGy). COMPLICATIONS: None immediate. TECHNIQUE: Informed written consent was obtained from the patient after a thorough discussion of the procedural risks, benefits and alternatives. All questions were addressed. Maximal Sterile Barrier Technique was utilized including caps, mask, sterile gowns, sterile gloves, sterile drape, hand hygiene and skin antiseptic. A timeout was performed prior to the initiation of the procedure. The right groin was prepped and draped in the usual sterile fashion. Thereafter using modified Seldinger technique, transfemoral access into the right common femoral artery was obtained without difficulty. Over a 0.035 inch guidewire, a 5 French Pinnacle sheath was inserted. Through this, and also over 0.035 inch guidewire, a 5 [place] JB 1 catheter was advanced to the aortic arch region and selectively positioned in the right common carotid artery, the right subclavian artery, the  left common carotid artery and the  left vertebral artery. Also performed was a 3D rotational arteriogram of the left anterior circulation via the left common carotid artery injection. The 3D images were developed on a separate workstation. FINDINGS:.: FINDINGS:. The origin of the right vertebral artery is widely patent. The vessel is seen to opacify to the cranial skull base. Mild fusiform dilatation is seen of the right vertebral artery at the level of C1 with two smooth channels noted above the posterior arch of C1 which joined to form the right vertebral artery. This measures approximately 15 mm. More distally the opacified portion of the basilar artery, the posterior cerebral arteries, the superior cerebellar arteries and the anterior-inferior cerebellar arteries opacify into the capillary and venous phases. Unopacified blood is seen in the basilar artery from the contralateral vertebral artery. Arteriograms were also performed with the patient's head turned to the left, and to the right without any evidence of kinking or slowing of hemodynamic ascent of contrast was noted. The patient did not exhibit any clinical symptoms at this time. The right common carotid arteriogram demonstrates the right external carotid artery and its major branches to be widely patent. The right internal carotid artery at the bulb to the cranial skull base demonstrates wide patency. The petrous, the cavernous and the supraclinoid segments are widely patent. The right middle cerebral artery and the right anterior cerebral artery opacify into the capillary and venous phases. The left common carotid arteriogram demonstrates the left external carotid artery and its major branches to be widely patent. The left internal carotid artery at the bulb to the cranial skull base is widely patent. The petrous, the cavernous and the supraclinoid segments are also widely patent. There is dysplastic fusiform dilatation of the caval cavernous segment of  the left internal carotid artery associated with a double density. 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 The left ophthalmic artery is widely patent. The left middle cerebral artery and the left anterior cerebral artery opacify into the capillary and venous phases. The dominant left vertebral artery is widely patent. The vessel has mild tortuosity just distal to its origin. More distally the vessel is seen to opacify to the cranial skull base. There is mild fusiform dilatation of the first horizontal segment of the left vertebral artery, with mild narrowing of the vertical segment and just distal to this. More distally the left vertebrobasilar junction and the left posterior-inferior cerebellar artery are patent. The basilar artery, the posterior cerebral arteries, the superior cerebellar arteries and the anterior-inferior cerebellar arteries opacify into the capillary and venous phases. 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 Relatively smooth arms of the fenestration. Associated mild proximal fusiform dilatation of the right vertebral artery. Approximately 4 mm x 3.5 mm saccular outpouching with wide neck at the level of the left ophthalmic artery consistent with a paraophthalmic aneurysm. 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 Mild FMD-like changes of the left vertebral artery at the skull base. PLAN: Findings were reviewed with the patient. Patient wishes to discuss the angiographic finding in the outpatient clinic accompanied by her husband as soon as possible. Electronically Signed   By: Julieanne Cotton M.D.   On:  11/23/2019  14:57    Treatments: Endovascular embolization of left ICA paraophthalmic aneurysm using an Evolve flow diverter  Discharge Exam: Blood pressure 115/84, pulse 99, temperature 98.7 F (37.1 C), temperature source Oral, resp. rate 16, height  (1.626 m), weight 209 lb 14.1 oz (95.2 kg), SpO2 94 %. Physical Exam Vitals and nursing note reviewed.  Constitutional:      General: She is not in acute distress.    Appearance: Normal appearance.  Cardiovascular:     Rate and Rhythm: Normal rate and regular rhythm.     Heart sounds: Normal heart sounds. No murmur heard.   Pulmonary:     Effort: Pulmonary effort is normal. No respiratory distress.     Breath sounds: Normal breath sounds. No wheezing.  Skin:    General: Skin is warm and dry.     Comments: Right femoral puncture site soft without active bleeding or hematoma.  Neurological:     Mental Status: She is alert and oriented to person, place, and time.     Comments: PERRL bilaterally. Can spontaneously move all extremities. No pronator drift. Distal pulses (DPs) 2+ bilaterally.  Psychiatric:        Mood and Affect: Mood normal.        Behavior: Behavior normal.     Disposition: Discharge disposition: 01-Home or Self Care       Discharge Instructions    Call MD for:  difficulty breathing, headache or visual disturbances   Complete by: As directed    Call MD for:  extreme fatigue   Complete by: As directed    Call MD for:  hives   Complete by: As directed    Call MD for:  persistant dizziness or light-headedness   Complete by: As directed    Call MD for:  persistant nausea and vomiting   Complete by: As directed    Call MD for:  redness, tenderness, or signs of infection (pain, swelling, redness, odor or green/yellow discharge around incision site)   Complete by: As directed    Call MD for:  severe uncontrolled pain   Complete by: As directed    Call MD for:  temperature >100.4   Complete by: As  directed    Diet - low sodium heart healthy   Complete by: As directed    Driving Restrictions   Complete by: As directed    No driving self until 2 week follow-up appointment.   Increase activity slowly   Complete by: As directed    Lifting restrictions   Complete by: As directed    No stooping, bending, or lifting more than 10 pounds until 2 week follow-up appointment.   Remove dressing in 24 hours   Complete by: As directed    Ok to remove dressing from right groin 24 hours post-procedure. Ok to shower 24 hours post-procedure. No submerging (swimming, bathing) for 7 days post-procedure.     Allergies as of 12/07/2019   No Known Allergies     Medication List    TAKE these medications   aspirin 81 MG EC tablet Take 4 tablets (325 mg total) by mouth daily. What changed: how much to take   atorvastatin 80 MG tablet Commonly known as: LIPITOR Take 1 tablet (80 mg total) by mouth daily.   clopidogrel 75 MG tablet Commonly known as: PLAVIX Take 1 tablet (75 mg total) by mouth daily.   diphenhydramine-acetaminophen 25-500 MG Tabs tablet Commonly known as: TYLENOL PM Take 1 tablet by mouth at bedtime  as needed (sleep).   Meclizine HCl 25 MG Chew Chew 25 mg by mouth 3 (three) times daily as needed (vertigo).   ondansetron 4 MG tablet Commonly known as: ZOFRAN Take 4 mg by mouth every 8 (eight) hours as needed for nausea or vomiting.   tizanidine 2 MG capsule Commonly known as: ZANAFLEX Take 1 capsule (2 mg total) by mouth at bedtime. What changed:   when to take this  reasons to take this   Vitamin D (Ergocalciferol) 1.25 MG (50000 UNIT) Caps capsule Commonly known as: DRISDOL Take 50,000 Units by mouth every Monday.       Follow-up Information    Julieanne Cotton, MD Follow up in 2 week(s).   Specialties: Interventional Radiology, Radiology Why: Please follow-up with Dr. Corliss Skains in clinic 2 weeks after discharge. Our office will call you to set up this  appointment. Contact information: [address] [place] [address] [phone]        Suzan Slick, MD Follow up in 2 week(s).   Specialty: Family Medicine Why: Please follow-up with PCP within 2 weeks of discharge for general health assessment. Contact information: [address] [place] [address] [phone]                Electronically Signed: Elwin Mocha, PA-C 12/07/2019, 12:26 PM   I have spent Less Than 30 Minutes discharging Kimberly Hopping.

## 2019-12-09 ENCOUNTER — Other Ambulatory Visit (HOSPITAL_COMMUNITY): Payer: Self-pay | Admitting: Interventional Radiology

## 2019-12-09 DIAGNOSIS — I671 Cerebral aneurysm, nonruptured: Secondary | ICD-10-CM

## 2020-01-04 DIAGNOSIS — Z0289 Encounter for other administrative examinations: Secondary | ICD-10-CM

## 2020-01-10 ENCOUNTER — Other Ambulatory Visit: Payer: Self-pay | Admitting: Radiology

## 2020-01-11 ENCOUNTER — Ambulatory Visit (HOSPITAL_COMMUNITY)
Admission: RE | Admit: 2020-01-11 | Discharge: 2020-01-11 | Disposition: A | Discharge status: Home / Self Care | Payer: BC Managed Care – PPO | Source: Ambulatory Visit | Attending: Student | Admitting: Student

## 2020-01-11 ENCOUNTER — Other Ambulatory Visit: Payer: Self-pay

## 2020-01-11 DIAGNOSIS — I671 Cerebral aneurysm, nonruptured: Secondary | ICD-10-CM

## 2020-01-12 HISTORY — PX: IR RADIOLOGIST EVAL & MGMT: IMG5224

## 2020-01-31 ENCOUNTER — Encounter: Payer: Self-pay | Admitting: Neurology

## 2020-01-31 ENCOUNTER — Ambulatory Visit: Payer: BC Managed Care – PPO | Admitting: Neurology

## 2020-01-31 VITALS — BP 156/93 | HR 85 | Ht 64.0 in | Wt 190.0 lb

## 2020-01-31 DIAGNOSIS — Q283 Other malformations of cerebral vessels: Secondary | ICD-10-CM

## 2020-01-31 DIAGNOSIS — I671 Cerebral aneurysm, nonruptured: Secondary | ICD-10-CM

## 2020-01-31 DIAGNOSIS — I69354 Hemiplegia and hemiparesis following cerebral infarction affecting left non-dominant side: Secondary | ICD-10-CM

## 2020-01-31 NOTE — Progress Notes (Signed)
[facility] [address] [place]. [place] [address] [phone]       STROKE FOLLOW UP NOTE  [name] Date of Birth:  02/03/1966 Medical Record Number:  [id number]   Reason for Referral: stroke follow up    SUBJECTIVE:   CHIEF COMPLAINT:  Chief Complaint  Patient presents with  . Follow-up    "found an aneurysm behind left eye" Room 2, Husband [name] in room    HPI:   Today, 10/20/2019, Kimberly [name] returns for follow-up regarding R VA dissection in 06/2019 accompanied by her husband  She continues to experience left-sided deficits, vertigo and gait impairment.  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 Noted as finding remains unchanged over time frame a congenital fenestration would be more likely per radiology report  She remains on both aspirin and Plavix without bleeding or bruising Remains on atorvastatin 80 mg daily without myalgias Blood pressure today 144/86  No further concerns at this time    History provided for reference purposes only Initial visit 07/27/2019 JM: She presents today for follow up regarding recent admission for stroke accompanied by her husband.  Since discharge, reports continued left-sided weakness, gait impairment and episodes of dizziness/vertigo worsened with head movement.  She also complains of neck pain and left orbital headache which has been persistent since  hospitalization.  Denies new or worsening stroke/TIA symptoms.  Currently working with PT and recently referred to OT by PCP and is currently waiting to schedule visits.  Ambulates with cane and denies any recent falls.  She has not returned back to work at [employer] and plans on obtaining short-term disability through PCP.  Has remained on DAPT without bleeding or bruising.  Continues on atorvastatin 80 mg daily.  Blood pressure today 127/89.  No further concerns at this time.  Stroke admission [name] is a 54 y.o. female with history of fibromyalgia and IBS  who presented on 07/02/2019 with  Vertigo, nausea, vomiting and left sided numbness / weakness.  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  Recommended to initiate DAPT for 3 months.  Recommended 4 to 6-week follow-up imaging in regards to possible dissection.  HTN stable.  LDL 191 initiate atorvastatin 80 mg daily.  Other stroke risk factors include prior EtOH use and obesity but no prior stroke history.  Discharged home in stable condition without therapy needs.  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  No mass or hemorrhage. Mucosal thickening noted in several ethmoid air cells. Probable cerumen in the right external auditory canal.   MRI head - Negative for acute infarct. Mild chronic white matter changes most likely due to small vessel ischemia.  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 MRI recommended to exclude acute infarct.   CT Perfusion - not ordered  Carotid Doppler - CTA neck  performed - carotid dopplers not indicated.  2D Echo - EF 55-60  Loyal Jacobson Virus 2  - negative  LDL - 191  HgbA1c - 5.3   Update 01/31/2020 : She returns for follow-up after last visit 3 months ago.  She is accompanied by her husband.  She has not had any recurrent stroke or TIA symptoms.  She continues to have intermittent dizziness and takes meclizine which seems to help.  She walks with a left ankle brace as well as of dragging her left foot and using a cane.  She can get by indoors in her house by holding onto walls and furniture.  She underwent diagnostic cerebral catheter angiogram by Dr. 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  Patient had a strong family history of multiple family members with cerebral aneurysms including ruptured once and she underwent elective endovascular aneurysm treatment with Dr. Corliss Skains on 12/06/2019 using a flow diverted device.  Procedure went well and she has had no complications.  She continues to have dizziness as well as left leg dragging and stiffness.  She walks with a ankle brace and uses a cane.  She has had no falls or injuries.  She remains on aspirin and Plavix which is tolerating well without side effects.  She does not smoke cigarettes.  She states her blood pressures well controlled today it is elevated in office at 156/93.   ROS:   14 system review of systems performed and negative with  exception of those listed in HPI  PMH:  Past Medical History:  Diagnosis Date  . Anxiety   . COPD (chronic obstructive pulmonary disease) (HCC)    mild - no inhaler  . Dizziness   . Fibromyalgia   . Headache   . HLD (hyperlipidemia)   . Hypertension   . IBS (irritable bowel syndrome)   . Insomnia   . Stroke [facility]) 06/27/2019    PSH:  Past Surgical History:  Procedure Laterality Date  . CHOLECYSTECTOMY  2003  . COLONOSCOPY    . IR 3D INDEPENDENT WKST  11/23/2019  . IR ANGIO INTRA EXTRACRAN SEL COM CAROTID INNOMINATE BILAT MOD SED  11/23/2019  . IR ANGIO INTRA EXTRACRAN SEL INTERNAL CAROTID UNI L MOD SED  12/06/2019  . IR ANGIO VERTEBRAL SEL SUBCLAVIAN INNOMINATE UNI R MOD SED  11/23/2019  . IR ANGIO VERTEBRAL SEL VERTEBRAL UNI L MOD SED  11/23/2019  . IR ANGIOGRAM FOLLOW UP STUDY  12/06/2019  . IR RADIOLOGIST EVAL & MGMT  01/12/2020  . IR TRANSCATH/EMBOLIZ  12/06/2019  . LAPAROSCOPY  2003   removal of kidney cyst  . RADIOLOGY WITH ANESTHESIA N/A 12/06/2019   Procedure: IR WITH ANESTHESIA EMBOLIZATION;  Surgeon: Corliss Skains,  Simonne Maffucci, MD;  Location: [facility] OR;  Service: Radiology;  Laterality: N/A;  . vaginal hysterectiny  04/1999    Social History:  Social History   Socioeconomic History  . Marital status: Married    Spouse name: [name]  . Number of children: Not on file  . Years of education: Not on file  . Highest education level: Not on file  Occupational History  . 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[employer]  Tobacco Use  . Smoking status: Passive Smoke Exposure - Never Smoker  . Smokeless tobacco: Never Used  Vaping Use  . Vaping Use: Never used  Substance and Sexual Activity  . Alcohol use: Not Currently  . Drug use: Never  . Sexual activity: Yes    Partners: Male    Birth control/protection: Surgical    Comment: Hysterectomy  Other Topics Concern  . Not on file  Social History Narrative   Lives with husband   Right Handed   Drinks 2-3 cups caffeine daily   Social  Determinants of Health   Financial Resource Strain: Not on file  Food Insecurity: Not on file  Transportation Needs: Not on file  Physical Activity: Not on file  Stress: Not on file  Social Connections: Not on file  Intimate Partner Violence: Not on file    Family History: History reviewed. No pertinent family history.  Medications:   Current Outpatient Medications on File Prior to Visit  Medication Sig Dispense Refill  . aspirin 81 MG EC tablet Take 4 tablets (325 mg total) by mouth daily. 30 tablet 3  . atorvastatin (LIPITOR) 80 MG tablet Take 1 tablet (80 mg total) by mouth daily. 90 tablet 3  . clopidogrel (PLAVIX) 75 MG tablet Take 1 tablet (75 mg total) by mouth daily. 90 tablet 0  . diphenhydramine-acetaminophen (TYLENOL PM) 25-500 MG TABS tablet Take 1 tablet by mouth at bedtime as needed (sleep).    . Meclizine HCl 25 MG CHEW Chew 25 mg by mouth 3 (three) times daily as needed (vertigo).    . ondansetron (ZOFRAN) 4 MG tablet Take 4 mg by mouth every 8 (eight) hours as needed for nausea or vomiting.    . tizanidine (ZANAFLEX) 2 MG capsule Take 1 capsule (2 mg total) by mouth at bedtime. (Patient taking differently: Take 2 mg by mouth at bedtime as needed for muscle spasms.) 30 capsule 4  . Vitamin D, Ergocalciferol, (DRISDOL) 1.25 MG (50000 UNIT) CAPS capsule Take 50,000 Units by mouth every Monday.     No current facility-administered medications on file prior to visit.    Allergies:  No Known Allergies    OBJECTIVE:  Physical Exam  Vitals:   01/31/20 1411  BP: (!) 156/93  Pulse: 85  Weight: 190 lb (86.2 kg)  Height: 5\' 4"  (1.626 m)   Body mass index is 32.61 kg/m. No exam data present  General: Mildly obese pleasant middle-aged,   Caucasian female, seated, in no evident distress Head: head normocephalic and atraumatic.   Neck: supple with no carotid or supraclavicular bruits Cardiovascular: regular rate and rhythm, no murmurs Musculoskeletal: no  deformity Skin:  no rash/petichiae Vascular:  Normal pulses all extremities   Neurologic Exam Mental Status: Awake and fully alert.  Fluent speech and language.  Oriented to place and time. Recent and remote memory intact. Attention span, concentration and fund of knowledge appropriate. Mood and affect appropriate.  Cranial Nerves: Pupils equal, briskly reactive to light. Extraocular movements full without nystagmus.   Visual fields full to confrontation. Hearing intact.  Facial sensation intact. Face, tongue, palate moves normally and symmetrically.  Motor: Normal bulk and tone.  Full strength right upper and lower extremity.    mild left-sided weakness with giveaway weakness noted; use of AFO brace for ambulation  Sensory.:  Hypersensitivity with light touch left face, upper and lower extremity.  Coordination: Rapid alternating movements normal in all extremities except decreased left hand. Finger-to-nose slowed movement on left but no evidence of ataxia or dysmetria and heel-to-shin performed accurately on right side with difficulty on the left; R>L upper extremity action tremors.  Gait and Station: Arises from chair with mild difficulty. Stance is normal. Gait demonstrates normal stride length with mild imbalance and use of cane and AFO bace Reflexes: 1+ and symmetric. Toes downgoing.        ASSESSMENT: Kimberly [name] is a 54 y.o. 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  MRI negative for stroke.  But persistent deficits suggest small brainstem infarct not visualized on MRI.  Vascular risk factors include fibromyalgia, HLD and prior EtOH use.  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  Incidental left paraophthalmic artery wide neck mildly bilobed  irregular left paraophthalmic region aneurysm was successfully endovascularly treated with placement of a 4.5 mm x 17 mm Evolve flow diverter device by Dr. Corliss Skainseveshwar on 12/06/2019.  She has strong family history of ruptured cerebral aneurysms multiple family members.   PLAN:  I had a long discussion with the patient and her husband regarding her remote stroke and residual left hemiparesis and gait difficulties as well as recent coiling of left paraophthalmic aneurysm.  I recommend she stay on aspirin and Plavix for minimum 6 months following her aneurysm coiling and not smoke cigarettes and maintain strict control of hypertension with blood pressure goal below 130/90.  She will continue meclizine as needed for dizziness.  She was advised to use a cane while walking outdoors at all times and to avoid sudden movements.  She will follow up with Dr. Corliss Skainseveshwar for aneurysm follow-up as scheduled.  She will return for follow-up in the future in 6 months with Shanda BumpsJessica nurse practitioner on call earlier if necessary. I spent 30 minutes of face-to-face and non-face-to-face time with patient and husband.  This included previsit chart review, lab review, study review, order entry, electronic health record documentation, patient education regarding R VA dissection, residual deficits, cranial nerve damage, importance of managing stroke risk factors and answered all questions to patient and husband satisfaction   Delia HeadyPramod Winola Drum, MD  [facility] [address] [place], [place] [id number]  Phone [phone] Fax [phone] Note: This document was prepared with digital dictation and possible smart phrase technology. Any transcriptional errors that result from this process are unintentional.

## 2020-01-31 NOTE — Patient Instructions (Signed)
I had a long discussion with the patient and her husband regarding her remote stroke and residual left hemiparesis and gait difficulties as well as recent coiling of left paraophthalmic aneurysm.  I recommend she stay on aspirin and Plavix for minimum 6 months following her aneurysm coiling and not smoke cigarettes and maintain strict control of hypertension with blood pressure goal below 130/90.  She will continue meclizine as needed for dizziness.  She was advised to use a cane while walking outdoors at all times and to avoid sudden movements.  She will follow up with Dr. Corliss Skains for aneurysm follow-up as scheduled.  She will return for follow-up in the future in 6 months with Shanda Bumps nurse practitioner on call earlier if necessary.

## 2020-02-29 DIAGNOSIS — Z0271 Encounter for disability determination: Secondary | ICD-10-CM

## 2020-02-29 NOTE — Telephone Encounter (Signed)
Paperwork given to Dr. [name] to complete and sign.

## 2020-03-07 ENCOUNTER — Telehealth (HOSPITAL_COMMUNITY): Payer: Self-pay

## 2020-03-07 ENCOUNTER — Other Ambulatory Visit (HOSPITAL_COMMUNITY): Payer: Self-pay | Admitting: Interventional Radiology

## 2020-03-07 DIAGNOSIS — I671 Cerebral aneurysm, nonruptured: Secondary | ICD-10-CM

## 2020-03-07 NOTE — Telephone Encounter (Signed)
Called to schedule f/u mri/mra, no answer, left vm. AW  

## 2020-03-09 ENCOUNTER — Telehealth (HOSPITAL_COMMUNITY): Payer: Self-pay

## 2020-03-09 NOTE — Telephone Encounter (Signed)
Called to schedule mri/mra, no answer, left vm. AW  

## 2020-03-24 ENCOUNTER — Ambulatory Visit (HOSPITAL_COMMUNITY): Payer: BC Managed Care – PPO

## 2020-03-24 ENCOUNTER — Other Ambulatory Visit: Payer: Self-pay

## 2020-03-24 ENCOUNTER — Encounter (HOSPITAL_COMMUNITY): Payer: Self-pay

## 2020-03-24 ENCOUNTER — Ambulatory Visit (HOSPITAL_COMMUNITY)
Admission: RE | Admit: 2020-03-24 | Discharge: 2020-03-24 | Disposition: A | Discharge status: Home / Self Care | Payer: BC Managed Care – PPO | Source: Ambulatory Visit | Attending: Interventional Radiology | Admitting: Interventional Radiology

## 2020-03-24 DIAGNOSIS — I671 Cerebral aneurysm, nonruptured: Secondary | ICD-10-CM | POA: Diagnosis present

## 2020-03-24 MED ORDER — GADOBUTROL 1 MMOL/ML IV SOLN
10.0000 mL | Freq: Once | INTRAVENOUS | Status: AC | PRN
  Administered 2020-03-24: 10 mL via INTRAVENOUS

## 2020-03-27 ENCOUNTER — Other Ambulatory Visit (HOSPITAL_COMMUNITY): Payer: Self-pay

## 2020-03-27 ENCOUNTER — Telehealth: Payer: Self-pay | Admitting: Student

## 2020-03-27 ENCOUNTER — Telehealth (HOSPITAL_COMMUNITY): Payer: Self-pay

## 2020-03-27 DIAGNOSIS — I729 Aneurysm of unspecified site: Secondary | ICD-10-CM

## 2020-03-27 LAB — PLATELET INHIBITION P2Y12: Platelet Function  P2Y12: 59 [PRU] — ABNORMAL LOW (ref 182–335)

## 2020-03-27 NOTE — Telephone Encounter (Addendum)
NIR.  P2Y12 59 PRU this AM. Discussed with Dr. Corliss Skains who recommends continuing Aspirin 325 mg once daily, discontinue taking Plavix 75 mg, and begin taking Plavix 37.5 mg once daily x 3 months. After 3 months, recommend discontinuing Plavix 37.5 mg and continue only taking Aspirin 325 mg once daily.  Called patient x3 this afternoon to discuss above with no answer. Will attempt to contact patient tomorrow.  Please call NIR with questions/concerns.   Waylan Boga Kripa Foskey, PA-C 03/27/2020, 3:29 PM    ADDENDUM: Called patient at [phone] to discuss above. Patient aware to take Plavix 37.5 mg once daily and Aspirin 325 mg once daily x 3 months (until 06/25/2020), then just take Aspirin 325 mg once daily. All questions answered and concerns addressed.   Waylan Boga Enedelia Martorelli, PA-C 03/28/2020, 8:33 AM

## 2020-03-27 NOTE — Telephone Encounter (Signed)
Pt agreed to f/u in 6 months with mri/mra. Deveshwar also wants the pt to stop by for a p2y12 any day this week. Pt has agreed to come by this week between 8 and 430 to get labs done. She is to come to radiology front desk and let them know.

## 2020-07-31 ENCOUNTER — Ambulatory Visit (INDEPENDENT_AMBULATORY_CARE_PROVIDER_SITE_OTHER): Payer: 59 | Admitting: Adult Health

## 2020-07-31 ENCOUNTER — Encounter: Payer: Self-pay | Admitting: Adult Health

## 2020-07-31 VITALS — BP 142/100 | HR 83 | Ht 64.0 in | Wt 210.0 lb

## 2020-07-31 DIAGNOSIS — I639 Cerebral infarction, unspecified: Secondary | ICD-10-CM | POA: Diagnosis not present

## 2020-07-31 DIAGNOSIS — Q283 Other malformations of cerebral vessels: Secondary | ICD-10-CM

## 2020-07-31 DIAGNOSIS — I671 Cerebral aneurysm, nonruptured: Secondary | ICD-10-CM | POA: Diagnosis not present

## 2020-07-31 DIAGNOSIS — I69354 Hemiplegia and hemiparesis following cerebral infarction affecting left non-dominant side: Secondary | ICD-10-CM | POA: Diagnosis not present

## 2020-07-31 DIAGNOSIS — R439 Unspecified disturbances of smell and taste: Secondary | ICD-10-CM

## 2020-07-31 DIAGNOSIS — R269 Unspecified abnormalities of gait and mobility: Secondary | ICD-10-CM

## 2020-07-31 DIAGNOSIS — I69398 Other sequelae of cerebral infarction: Secondary | ICD-10-CM

## 2020-07-31 NOTE — Patient Instructions (Addendum)
Continue aspirin 325 mg daily  and atorvastatin  for secondary stroke prevention  Continue to follow up with PCP regarding cholesterol and blood pressure management  Maintain strict control of hypertension with blood pressure goal below 130/90 and cholesterol with LDL cholesterol (bad cholesterol) goal below 70 mg/dL.   Referral placed to [facility] PT for left shoulder and neck pain for dry needling and further stretching and exercises  Ensure follow up with Dr. Corliss Skains - you should be called next month to schedule imaging - if you do not hear from them by end of July/beginning of August, please call their office  Follow up with your PCP for abnormal smell which could be due to poor denture/infection or sinus issues      Followup in the future with me in 6 months or call earlier if needed      Thank you for coming to see [place] at [facility]. I hope we have been able to provide you high quality care today.  You may receive a patient satisfaction survey over the next few weeks. We would appreciate your feedback and comments so that we may continue to improve ourselves and the health of our patients.

## 2020-07-31 NOTE — Progress Notes (Signed)
[facility] [address] Kimberly [name]. [place] [address] [phone]       STROKE FOLLOW UP NOTE  [name] Date of Birth:  20-Sep-1965 Medical Record Number:  [id number]   Reason for Referral: stroke follow up    SUBJECTIVE:   CHIEF COMPLAINT:  Chief Complaint  Patient presents with   Follow-up    TR with spouse Kimberly [name] Pt is well, still having weakness on L side, having a "carbon monoxide smell" when breathing and headaches.      HPI:   Today, 07/31/2020, [name] returns for 22-month stroke follow-up accompanied by her husband, Kimberly [name].  She has been stable without new stroke/TIA symptoms.  Reports residual intermittent dizziness/vertigo and left-sided weakness.  Intermittent use of meclizine for vertigo with benefit.  She has been using Rollator walker to assist with ambulation without any recent falls.  She is currently in the process of applying for Social Security disability with initial denial and currently in appeal process working with an Pensions consultant.  She has been trying to increase her daily activity and independence but does still need assistance by her husband.  She also reports occasional left orbital headache since aneurysm procedure but this is been gradually improving.  She has since completed DAPT and remains on aspirin 325 mg daily as well as atorvastatin without associated side effects.  Blood pressure today 142/100 -reports routinely monitoring at home and SBP typically 130s.  Follow-up with Dr. Corliss [name] with repeat imaging 03/2020 showed several chronic microhemorrhages within the left cerebral hemisphere new compared to prior imaging and stable appearance treated aneurysm.  Recommended repeat imaging around 09/2020.  She also reports occasionally smelling "carbon monoxide" describing as exhaust fumes since she was diagnosed with influenza on 4/9 -she does endorse continued congestion with cough and postnasal drip since that time.  No further concerns at  this time.    History provided for reference purposes only Update 01/31/2020 Dr. Pearlean [name]: She returns for follow-up after last visit 3 months ago.  She is accompanied by her husband.  She has not had any recurrent stroke or TIA symptoms.  She continues to have intermittent dizziness and takes meclizine which seems to help.  She walks with a left ankle brace as well as of dragging her left foot and using a cane.  She can get by indoors in her house by holding onto walls and furniture.  She underwent diagnostic cerebral catheter angiogram by Dr. 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  Patient had a strong family history of multiple family members with cerebral aneurysms including ruptured once and she underwent elective endovascular aneurysm treatment with Dr. Corliss [name] on 12/06/2019 using a flow diverted device.  Procedure went well and she has had no complications.  She continues to have dizziness as well as left leg dragging and stiffness.  She walks with a ankle brace and uses a cane.  She has had no falls or injuries.  She remains  on aspirin and Plavix which is tolerating well without side effects.  She does not smoke cigarettes.  She states her blood pressures well controlled today it is elevated in office at 156/93.   Update 10/20/2019 Kimberly [name]: [name] returns for follow-up regarding R VA dissection in 06/2019 accompanied by her husband  She  continues to experience left-sided deficits, vertigo and gait impairment.  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 Noted as finding remains unchanged over time frame a congenital fenestration would be more likely per radiology report  She remains on both aspirin and Plavix without bleeding or bruising Remains on atorvastatin 80 mg daily without myalgias Blood pressure today 144/86  No further concerns at this time  Initial visit 07/27/2019 Kimberly [name]: She presents today for follow up regarding recent admission for stroke accompanied by her husband.  Since discharge, reports continued left-sided weakness, gait impairment and episodes of dizziness/vertigo worsened with head movement.  She also complains of neck pain and left orbital headache which has been persistent since hospitalization.  Denies new or worsening stroke/TIA symptoms.  Currently working with PT and recently referred to OT by PCP and is currently waiting to schedule visits.  Ambulates with cane and denies any recent falls.  She has not returned back to work at [employer] and plans on obtaining short-term disability through PCP.  Has remained on DAPT without bleeding or bruising.  Continues on atorvastatin 80 mg daily.  Blood pressure today 127/89.  No further concerns at this time.  Stroke admission [name] is a 55 y.o. female with history of fibromyalgia and IBS  who  presented on 07/02/2019 with  Vertigo, nausea, vomiting and left sided numbness / weakness.  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  Recommended to initiate DAPT for 3 months.  Recommended 4 to 6-week follow-up imaging in regards to possible dissection.  HTN stable.  LDL 191 initiate atorvastatin 80 mg daily.  Other stroke risk factors include prior EtOH use and obesity but no prior stroke history.  Discharged home in stable condition without therapy needs.  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  No mass or hemorrhage. Mucosal thickening noted in several ethmoid air cells. Probable cerumen in the right external auditory canal.  MRI head - Negative for acute infarct. Mild chronic white matter changes most likely due to small vessel ischemia.  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 MRI recommended to exclude acute infarct.  CT Perfusion - not ordered Carotid Doppler - CTA neck performed - carotid dopplers not indicated. 2D Echo - EF 55-60 Loyal JacobsonSars Corona Virus 2  -  negative LDL - 191 HgbA1c - 5.3     ROS:   14 system review of systems performed and negative with exception of those listed in HPI  PMH:  Past Medical History:  Diagnosis Date   Anxiety    COPD (chronic obstructive pulmonary disease) (HCC)    mild - no inhaler   Dizziness    Fibromyalgia    Headache    HLD (hyperlipidemia)    Hypertension    IBS (irritable bowel syndrome)    Insomnia    Stroke (HCC) 06/27/2019    PSH:  Past Surgical History:  Procedure Laterality Date   CHOLECYSTECTOMY  2003    COLONOSCOPY     IR 3D INDEPENDENT WKST  11/23/2019   IR ANGIO INTRA EXTRACRAN SEL COM CAROTID INNOMINATE BILAT MOD SED  11/23/2019   IR ANGIO INTRA EXTRACRAN SEL INTERNAL CAROTID UNI L MOD SED  12/06/2019   IR ANGIO VERTEBRAL SEL SUBCLAVIAN INNOMINATE UNI R MOD SED  11/23/2019   IR ANGIO VERTEBRAL SEL VERTEBRAL UNI L MOD SED  11/23/2019   IR ANGIOGRAM FOLLOW UP STUDY  12/06/2019   IR RADIOLOGIST EVAL & MGMT  01/12/2020   IR TRANSCATH/EMBOLIZ  12/06/2019   LAPAROSCOPY  2003   removal of kidney cyst   RADIOLOGY WITH ANESTHESIA N/A 12/06/2019   Procedure: IR WITH ANESTHESIA EMBOLIZATION;  Surgeon: Julieanne Cotton, MD;  Location: [facility] OR;  Service: Radiology;  Laterality: N/A;   vaginal hysterectiny  04/1999    Social History:  Social History   Socioeconomic History   Marital status: Married    Spouse name: Kimberly [name]   Number of children: Not on file   Years of education: Not on file   Highest education level: Not on file  Occupational History   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[employer]  Tobacco Use   Smoking status: Passive Smoke Exposure - Never Smoker   Smokeless tobacco: Never  Vaping Use   Vaping Use: Never used  Substance and Sexual Activity   Alcohol use: Not Currently   Drug use: Never   Sexual activity: Yes    Partners: Male    Birth control/protection: Surgical    Comment: Hysterectomy  Other Topics Concern   Not on file  Social History Narrative   Lives with husband   Right Handed   Drinks 2-3 cups caffeine daily   Social Determinants of Health   Financial Resource Strain: Not on file  Food Insecurity: Not on file  Transportation Needs: Not on file  Physical Activity: Not on file  Stress: Not on file  Social Connections: Not on file  Intimate Partner Violence: Not on file    Family History: History reviewed. No pertinent family history.  Medications:   Current Outpatient Medications on File Prior to Visit  Medication Sig Dispense Refill   aspirin 81 MG EC tablet Take  4 tablets (325 mg total) by mouth daily. 30 tablet 3   atorvastatin (LIPITOR) 80 MG tablet Take 1 tablet (80 mg total) by mouth daily. 90 tablet 3   diphenhydramine-acetaminophen (TYLENOL PM) 25-500 MG TABS tablet Take 1 tablet by mouth at bedtime as needed (sleep).     Meclizine HCl 25 MG CHEW Chew 25 mg by mouth 3 (three) times daily as needed (vertigo).     metoprolol tartrate (LOPRESSOR) 25 MG tablet Take 25 mg by mouth 2 (two) times daily.     Multiple Vitamins-Minerals (ONE-A-DAY VITACRAVES ADULT PO) Take by mouth.     ondansetron (ZOFRAN) 4 MG tablet Take 4  mg by mouth every 8 (eight) hours as needed for nausea or vomiting.     tiZANidine (ZANAFLEX) 2 MG tablet Take by mouth every 6 (six) hours as needed for muscle spasms.     Vitamin D, Ergocalciferol, (DRISDOL) 1.25 MG (50000 UNIT) CAPS capsule Take 50,000 Units by mouth every Monday.     No current facility-administered medications on file prior to visit.    Allergies:  No Known Allergies    OBJECTIVE:  Physical Exam  Vitals:   07/31/20 1003  BP: (!) 142/100  Pulse: 83  Weight: 210 lb (95.3 kg)  Height: 5\' 4"  (1.626 m)    Body mass index is 36.05 kg/m. No results found.  General: Mildly obese pleasant middle-aged, Caucasian female, seated, in no evident distress Head: head normocephalic and atraumatic.   Neck: supple with no carotid or supraclavicular bruits Cardiovascular: regular rate and rhythm, no murmurs Musculoskeletal: no deformity Skin:  no rash/petichiae Vascular:  Normal pulses all extremities   Neurologic Exam Mental Status: Awake and fully alert.  Fluent speech and language.  Oriented to place and time. Recent and remote memory intact. Attention span, concentration and fund of knowledge appropriate. Mood and affect appropriate.  Cranial Nerves: Pupils equal, briskly reactive to light. Extraocular movements full without nystagmus.   Visual fields full to confrontation. Hearing intact. Facial sensation  intact. Face, tongue, palate moves normally and symmetrically.  Motor: Normal bulk and tone.  Full strength right upper and lower extremity.    mild left-sided weakness with giveaway weakness noted; limited left shoulder ROM d/t pain; use of AFO brace for ambulation  Sensory.:  Hypersensitivity with light touch left face, upper and lower extremity.  Coordination: Rapid alternating movements normal in all extremities except decreased left hand. Finger-to-nose slowed movement on left but no evidence of ataxia or dysmetria and heel-to-shin performed accurately on right side with difficulty on the left Gait and Station: Arises from chair with mild difficulty. Stance is normal. Gait demonstrates normal stride length with mild imbalance and use of Rollator walker and AFO brace.  Tandem walk and heel toe not attempted Reflexes: 1+ and symmetric. Toes downgoing.        ASSESSMENT: Kimberly [name] is a 55 y.o. 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  MRI negative for stroke but persistent deficits suggest small brainstem infarct not visualized on MRI.  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  Incidental left paraophthalmic artery wide neck mildly bilobed irregular left paraophthalmic region aneurysm was successfully endovascularly treated with placement of a 4.5 mm x 17 mm Evolve flow diverter device by Dr. 07/04/2019 on 12/06/2019.  She has strong family history of ruptured cerebral aneurysms multiple family members. Vascular risk factors include fibromyalgia, HLD and prior EtOH use.      PLAN:  1.  Small brainstem stroke -Residual deficits: Left hemiparesis, gait difficulties and  intermittent vertigo.   -She is currently in the process of applying for Social Security disability -in setting of residual stroke deficits she would likely have difficulty performing majority of different job functions as she continues to have difficulty with left arm functioning, impaired gait with decreased activity tolerance and difficulty ambulating long distance and intermittent vertigo which interferes with daily functioning and activities.  As her  stroke was over 1 year ago, she will likely not have any further recovery and her residual deficits are likely permanent at this time. -Referral placed to PT for left shoulder and neck pain with possible benefit of dry needling and stretching exercises. May need referral to orthopedics if no benefit -Continue aspirin and atorvastatin 80 mg daily for secondary stroke prevention -Discussed secondary stroke prevention measures and importance of close PCP follow-up for aggressive stroke risk factor management including HLD with LDL goal<70  2.  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 Corliss [name] in 09/2020 for repeat imaging  3.  Intermittent abnormal sense of smell -Unknown etiology but possibly in setting of influenza in 05/2020 as symptoms started at this time -advised to further follow-up with PCP and possible referral to ENT if indicated   Follow-up in 6 months or call earlier if needed    CC:  [facility] provider: Dr. Bayard Hugger, Beatrix Fetters, MD   I spent 43 minutes of face-to-face and non-face-to-face time with patient and husband.  This included previsit chart review, lab review, study review, electronic health record documentation, and patient and husband education and discussion regarding history of prior stroke and residual deficits, secondary stroke prevention measures and importance of aggressive stroke risk factor management, aneurysm s/p coiling, abnormal sense of smell and possible etiologies and answered all other  questions to patient and husband satisfaction  Ihor Austin, AGNP-BC  [facility] [address] [place], [place] [address]  Phone [phone] Fax [phone] Note: This document was prepared with digital dictation and possible smart phrase technology. Any transcriptional errors that result from this process are unintentional.

## 2020-07-31 NOTE — Telephone Encounter (Signed)
Thank you for this update!   Take care Shanda Bumps, NP

## 2020-08-04 NOTE — Progress Notes (Signed)
I agree with the above plan 

## 2020-10-02 DIAGNOSIS — Z0271 Encounter for disability determination: Secondary | ICD-10-CM

## 2020-10-11 ENCOUNTER — Telehealth: Payer: Self-pay

## 2020-10-11 DIAGNOSIS — Z0289 Encounter for other administrative examinations: Secondary | ICD-10-CM

## 2020-10-11 NOTE — Telephone Encounter (Signed)
Completed and signed.  Thank you. 

## 2020-10-11 NOTE — Telephone Encounter (Signed)
Form signed, completed and placed on jessicas desk for review and signature. Need pt med release signed to attach OV notes

## 2020-10-11 NOTE — Telephone Encounter (Signed)
Completed and given back to [place], medical records.

## 2020-10-18 ENCOUNTER — Telehealth (HOSPITAL_COMMUNITY): Payer: Self-pay

## 2020-10-18 NOTE — Telephone Encounter (Signed)
Called pt regarding insurance card, no answer, left vm. AW

## 2020-10-24 ENCOUNTER — Telehealth (HOSPITAL_COMMUNITY): Payer: Self-pay

## 2020-10-24 NOTE — Telephone Encounter (Signed)
Called to schedule mri/mra, no answer, no vm. AW  

## 2020-10-26 ENCOUNTER — Other Ambulatory Visit (HOSPITAL_COMMUNITY): Payer: Self-pay | Admitting: Interventional Radiology

## 2020-10-26 DIAGNOSIS — I7774 Dissection of vertebral artery: Secondary | ICD-10-CM

## 2020-11-06 ENCOUNTER — Ambulatory Visit (HOSPITAL_COMMUNITY)
Admission: RE | Admit: 2020-11-06 | Discharge: 2020-11-06 | Disposition: A | Discharge status: Home / Self Care | Payer: 59 | Source: Ambulatory Visit | Attending: Interventional Radiology | Admitting: Interventional Radiology

## 2020-11-06 ENCOUNTER — Other Ambulatory Visit: Payer: Self-pay

## 2020-11-06 ENCOUNTER — Encounter (HOSPITAL_COMMUNITY): Payer: Self-pay

## 2020-11-06 DIAGNOSIS — I7774 Dissection of vertebral artery: Secondary | ICD-10-CM | POA: Insufficient documentation

## 2020-11-08 ENCOUNTER — Telehealth (HOSPITAL_COMMUNITY): Payer: Self-pay

## 2020-11-08 NOTE — Telephone Encounter (Signed)
Pt agreed to f/u in 6 months with cta head/neck. AW 

## 2020-11-08 NOTE — Telephone Encounter (Signed)
Called pt regarding recent imaging, no answer, left vm. AW  

## 2020-11-09 ENCOUNTER — Telehealth (HOSPITAL_COMMUNITY): Payer: Self-pay

## 2020-11-09 NOTE — Telephone Encounter (Signed)
Returned pt's call, no answer, left vm. AW  

## 2020-12-14 ENCOUNTER — Other Ambulatory Visit: Payer: Self-pay | Admitting: Adult Health

## 2021-01-17 ENCOUNTER — Other Ambulatory Visit (HOSPITAL_COMMUNITY): Payer: Self-pay | Admitting: Internal Medicine

## 2021-01-17 DIAGNOSIS — Z1231 Encounter for screening mammogram for malignant neoplasm of breast: Secondary | ICD-10-CM

## 2021-01-30 ENCOUNTER — Ambulatory Visit (INDEPENDENT_AMBULATORY_CARE_PROVIDER_SITE_OTHER): Payer: 59 | Admitting: Adult Health

## 2021-01-30 ENCOUNTER — Encounter: Payer: Self-pay | Admitting: Adult Health

## 2021-01-30 VITALS — BP 144/99 | HR 70 | Ht 66.0 in | Wt 214.0 lb

## 2021-01-30 DIAGNOSIS — I639 Cerebral infarction, unspecified: Secondary | ICD-10-CM

## 2021-01-30 DIAGNOSIS — I69398 Other sequelae of cerebral infarction: Secondary | ICD-10-CM | POA: Diagnosis not present

## 2021-01-30 DIAGNOSIS — I69354 Hemiplegia and hemiparesis following cerebral infarction affecting left non-dominant side: Secondary | ICD-10-CM | POA: Diagnosis not present

## 2021-01-30 DIAGNOSIS — R269 Unspecified abnormalities of gait and mobility: Secondary | ICD-10-CM | POA: Diagnosis not present

## 2021-01-30 NOTE — Patient Instructions (Addendum)
Try use of compression devices on your left and hand to help with swelling   Continue aspirin 81 mg daily  and atorvastatin  for secondary stroke prevention  Continue to follow up with PCP regarding cholesterol and blood pressure management  Maintain strict control of hypertension with blood pressure goal below 130/90 and cholesterol with LDL cholesterol (bad cholesterol) goal below 70 mg/dL.   Signs of a Stroke? Follow the BEFAST method:  Balance Watch for a sudden loss of balance, trouble with coordination or vertigo Eyes Is there a sudden loss of vision in one or both eyes? Or double vision?  Face: Ask the person to smile. Does one side of the face droop or is it numb?  Arms: Ask the person to raise both arms. Does one arm drift downward? Is there weakness or numbness of a leg? Speech: Ask the person to repeat a simple phrase. Does the speech sound slurred/strange? Is the person confused ? Time: If you observe any of these signs, call 911.     Followup in the future with me in 6 months or call earlier if needed     Thank you for coming to see [place] at [facility]. I hope we have been able to provide you high quality care today.  You may receive a patient satisfaction survey over the next few weeks. We would appreciate your feedback and comments so that we may continue to improve ourselves and the health of our patients.

## 2021-01-30 NOTE — Progress Notes (Signed)
[facility] [address] [place]. [place] [address] [phone]       STROKE FOLLOW UP NOTE  [name] Date of Birth:  Apr 24, 1965 Medical Record Number:  [id number]   Reason for Referral: stroke follow up    SUBJECTIVE:   CHIEF COMPLAINT:  Chief Complaint  Patient presents with   Follow-up    Rm 2 with spouse [name]  Pt is well and stable, making some improving but having swelling on L side. no new concerns      HPI:   Today, 01/30/2021, JM: Kimberly [name] returns for stroke follow-up with husband [name]. Denies new or worsening stroke/TIA symptoms.  Continued left hemiparesis with subjective numbness stable.  Intermittent left-sided swelling (not new). Dizziness/vertigo stable. She is using a cane for short distance and Rollator walker for longer distances and denies recent falls. Continues using AFO soft brace LLE.  Able to perform ADLs and trying increase house chores but will need to take frequent breaks. Remains on Aspirin and Atorvastatin without side effects. BP today 144/99. Reports recent labs by PCP - reports improvement of cholesterol (unable to view via epci). Social security disability obtained - remains on LTD thru [employer] (prior employer). Repeat MR brain and MRA 11/2020 completed by Dr. Corliss Skains showed stable appearance compared to prior imaging 03/2020. Plans to repeat 03/2021.  No further/new concerns at this time    History provided for reference purposes only Update 07/31/2020 JM: Kimberly [name] returns for 41-month stroke follow-up accompanied by her husband, [name].  She has been stable without new stroke/TIA symptoms.  Reports residual intermittent dizziness/vertigo and left-sided weakness.  Intermittent use of meclizine for vertigo with benefit.  She has been using Rollator walker to assist with ambulation without any recent falls.  She is currently in the process of applying for Social Security disability with initial denial and currently in appeal  process working with an Pensions consultant.  She has been trying to increase her daily activity and independence but does still need assistance by her husband.  She also reports occasional left orbital headache since aneurysm procedure but this is been gradually improving.  She has since completed DAPT and remains on aspirin 325 mg daily as well as atorvastatin without associated side effects.  Blood pressure today 142/100 -reports routinely monitoring at home and SBP typically 130s.  Follow-up with Dr. Corliss Skains with repeat imaging 03/2020 showed several chronic microhemorrhages within the left cerebral hemisphere new compared to prior imaging and stable appearance treated aneurysm.  Recommended repeat imaging around 09/2020.  She also reports occasionally smelling "carbon monoxide" describing as exhaust fumes since she was diagnosed with influenza on 4/9 -she does endorse continued congestion with cough and postnasal drip since that time.  No further concerns at this time.   Update 01/31/2020 Dr. [name]: She returns for follow-up after last visit 3 months ago.  She is accompanied by her husband.  She has not had any recurrent stroke or TIA symptoms.  She continues to have intermittent dizziness and takes meclizine which seems to help.  She walks with a left ankle brace as well as of dragging her left foot and using a cane.  She can get by indoors in her house by holding onto walls and furniture.  She underwent diagnostic cerebral catheter angiogram by Dr. 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  There is also associated fusiform dysplastic changes  of the caval cavernous segment of the left ICA and mild fibromuscular dysplasia-like changes at the left vertebral artery at skull base.  Patient had a strong family history of multiple family members with cerebral aneurysms including ruptured once and she underwent elective endovascular aneurysm treatment with Dr. Corliss Skains on 12/06/2019 using a flow diverted device.  Procedure went well and she has had no complications.  She continues to have dizziness as well as left leg dragging and stiffness.  She walks with a ankle brace and uses a cane.  She has had no falls or injuries.  She remains on aspirin and Plavix which is tolerating well without side effects.  She does not smoke cigarettes.  She states her blood pressures well controlled today it is elevated in office at 156/93.   Update 10/20/2019 JM: Kimberly [name] returns for follow-up regarding R VA dissection in 06/2019 accompanied by her husband  She continues to experience left-sided deficits, vertigo and gait impairment.  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 Noted as finding remains unchanged over time frame a congenital fenestration would be more likely per radiology report  She remains on both aspirin and Plavix without bleeding or bruising Remains on atorvastatin 80 mg daily without myalgias Blood pressure today 144/86  No further concerns at this time  Initial visit 07/27/2019 JM: She  presents today for follow up regarding recent admission for stroke accompanied by her husband.  Since discharge, reports continued left-sided weakness, gait impairment and episodes of dizziness/vertigo worsened with head movement.  She also complains of neck pain and left orbital headache which has been persistent since hospitalization.  Denies new or worsening stroke/TIA symptoms.  Currently working with PT and recently referred to OT by PCP and is currently waiting to schedule visits.  Ambulates with cane and denies any recent falls.  She has not returned back to work at [employer] and plans on obtaining short-term disability through PCP.  Has remained on DAPT without bleeding or bruising.  Continues on atorvastatin 80 mg daily.  Blood pressure today 127/89.  No further concerns at this time.  Stroke admission Kimberly [name] is a 55 y.o. female with history of fibromyalgia and IBS  who presented on 07/02/2019 with  Vertigo, nausea, vomiting and left sided numbness / weakness.  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  Recommended to initiate DAPT for 3 months.  Recommended 4 to 6-week follow-up imaging in regards to possible dissection.  HTN stable.  LDL 191 initiate atorvastatin 80 mg daily.  Other stroke risk factors include prior EtOH use and obesity but no prior stroke history.  Discharged home in stable condition without therapy needs.  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  No mass or hemorrhage. Mucosal thickening noted in several ethmoid air cells. Probable cerumen in the right external auditory canal.  MRI head - Negative for acute infarct. Mild chronic  white matter changes most likely due to small vessel ischemia.  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 MRI recommended to exclude acute infarct.  CT Perfusion - not ordered Carotid Doppler - CTA neck performed - carotid dopplers not indicated. 2D Echo - EF 55-60 Loyal Jacobson Virus 2  - negative LDL - 191 HgbA1c - 5.3     ROS:   14 system review of systems performed and negative with exception of those listed in HPI  PMH:  Past Medical History:  Diagnosis Date   Anxiety    COPD (chronic obstructive pulmonary disease) (HCC)    mild - no inhaler   Dizziness    Fibromyalgia    Headache    HLD (hyperlipidemia)    Hypertension    IBS (irritable bowel syndrome)    Insomnia    Stroke (HCC) 06/27/2019    PSH:  Past Surgical History:  Procedure Laterality Date   CHOLECYSTECTOMY  2003   COLONOSCOPY     IR 3D INDEPENDENT WKST  11/23/2019   IR ANGIO INTRA EXTRACRAN SEL COM CAROTID INNOMINATE BILAT MOD SED  11/23/2019   IR ANGIO INTRA EXTRACRAN SEL INTERNAL CAROTID UNI L MOD SED  12/06/2019   IR ANGIO VERTEBRAL SEL SUBCLAVIAN INNOMINATE UNI R MOD SED  11/23/2019   IR ANGIO VERTEBRAL SEL VERTEBRAL UNI L MOD SED  11/23/2019   IR ANGIOGRAM FOLLOW UP STUDY  12/06/2019   IR RADIOLOGIST EVAL & MGMT  01/12/2020   IR TRANSCATH/EMBOLIZ  12/06/2019   LAPAROSCOPY  2003   removal of kidney cyst   RADIOLOGY WITH ANESTHESIA N/A 12/06/2019   Procedure: IR WITH ANESTHESIA EMBOLIZATION;  Surgeon: Julieanne Cotton, MD;  Location: [facility] OR;  Service: Radiology;  Laterality: N/A;   vaginal hysterectiny  04/1999    Social History:  Social History   Socioeconomic History   Marital status: Married    Spouse name: [name]   Number of children: Not on file   Years of education: Not on file   Highest education level: Not on file  Occupational History   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[employer]  Tobacco Use   Smoking  status: Passive Smoke Exposure - Never Smoker   Smokeless tobacco: Never  Vaping Use   Vaping Use: Never used  Substance and Sexual Activity   Alcohol use: Not Currently   Drug use: Never   Sexual activity: Yes    Partners: Male    Birth control/protection: Surgical    Comment: Hysterectomy  Other Topics Concern   Not on file  Social History Narrative   Lives with husband   Right Handed   Drinks 2-3 cups caffeine daily   Social Determinants of Health   Financial Resource Strain: Not on file  Food Insecurity: Not on file  Transportation Needs: Not on file  Physical Activity: Not on file  Stress: Not on file  Social Connections: Not on file  Intimate Partner Violence: Not on file  Family History: History reviewed. No pertinent family history.  Medications:   Current Outpatient Medications on File Prior to Visit  Medication Sig Dispense Refill   aspirin 81 MG EC tablet Take 4 tablets (325 mg total) by mouth daily. 30 tablet 3   atorvastatin (LIPITOR) 80 MG tablet Take 1 tablet by mouth once daily 90 tablet 0   diphenhydramine-acetaminophen (TYLENOL PM) 25-500 MG TABS tablet Take 1 tablet by mouth at bedtime as needed (sleep).     Meclizine HCl 25 MG CHEW Chew 25 mg by mouth 3 (three) times daily as needed (vertigo).     metoprolol tartrate (LOPRESSOR) 25 MG tablet Take 25 mg by mouth 2 (two) times daily.     Multiple Vitamins-Minerals (ONE-A-DAY VITACRAVES ADULT PO) Take by mouth.     ondansetron (ZOFRAN) 4 MG tablet Take 4 mg by mouth every 8 (eight) hours as needed for nausea or vomiting.     tiZANidine (ZANAFLEX) 2 MG tablet Take by mouth every 6 (six) hours as needed for muscle spasms.     Vitamin D, Ergocalciferol, (DRISDOL) 1.25 MG (50000 UNIT) CAPS capsule Take 50,000 Units by mouth every Monday.     No current facility-administered medications on file prior to visit.    Allergies:  No Known Allergies    OBJECTIVE:  Physical Exam  Vitals:   01/30/21 1030   BP: (!) 144/99  Pulse: 70  Weight: 214 lb (97.1 kg)  Height: 5\' 6"  (1.676 m)   Body mass index is 34.54 kg/m. No results found.  General: Mildly obese pleasant middle-aged, Caucasian female, seated, in no evident distress Head: head normocephalic and atraumatic.   Neck: supple with no carotid or supraclavicular bruits Cardiovascular: regular rate and rhythm, no murmurs Musculoskeletal: no deformity Skin:  no rash/petichiae Vascular:  Normal pulses all extremities   Neurologic Exam Mental Status: Awake and fully alert.  Fluent speech and language.  Oriented to place and time. Recent and remote memory intact. Attention span, concentration and fund of knowledge appropriate. Mood and affect appropriate.  Cranial Nerves: Pupils equal, briskly reactive to light. Extraocular movements full without nystagmus. Visual fields full to confrontation. Hearing intact. Facial sensation intact. Face, tongue, palate moves normally and symmetrically.  Motor: Normal bulk and tone. Full strength right upper and lower extremity.  Mild left-sided weakness although difficulty fully assessing d/t giveaway weakness; limited left shoulder ROM; use of AFO brace for ambulation  Sensory: Hypersensitivity with light touch and vibratory sensation left upper and lower extremity Coordination: Rapid alternating movements normal in all extremities except decreased left hand. Finger-to-nose slowed movement on left but no evidence of ataxia or dysmetria and heel-to-shin performed accurately on right side with difficulty on the left Gait and Station: Arises from chair with mild difficulty. Stance is normal. Gait demonstrates normal stride length with mild imbalance and use of cane and AFO brace.  Tandem walk and heel toe not attempted Reflexes: 1+ and symmetric. Toes downgoing.        ASSESSMENT: Kimberly [name] is a 55 y.o. 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  MRI negative for stroke but persistent deficits suggest small brainstem infarct not visualized on MRI.  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  Incidental left paraophthalmic artery wide neck  mildly bilobed irregular left paraophthalmic region aneurysm was successfully endovascularly treated with placement of a 4.5 mm x 17 mm Evolve flow diverter device by Dr. Corliss Skains on 12/06/2019.  She has strong family history of ruptured cerebral aneurysms multiple family members. Vascular risk factors include fibromyalgia, HLD and prior EtOH use.      PLAN:  1.  Small brainstem stroke -Residual deficits: Left hemiparesis with sensory impairment, gait difficulties and intermittent vertigo. stable  -Obtained Social Security disability and remains on LTD from [employer]  -Continue aspirin and atorvastatin 80 mg daily for secondary stroke prevention -Discussed secondary stroke prevention measures and importance of close PCP follow-up for aggressive stroke risk factor management including HLD with LDL goal<70  2.  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 Corliss Skains in 03/2021 for repeat imaging    Follow-up in 6 months or call earlier if needed - if remains stable, can follow up as needed    CC:  Hasanaj, Myra Gianotti, MD   I spent 37 minutes of face-to-face and non-face-to-face time with patient and husband.  This included previsit chart review, lab review, study review, electronic health record documentation, and patient and husband education and discussion regarding history of prior stroke and residual deficits, secondary stroke prevention measures and importance of aggressive stroke risk factor management, and answered  all other questions to patient and husband satisfaction  Ihor Austin, AGNP-BC  [facility] [address] [place], [place] [address]  Phone [phone] Fax [phone] Note: This document was prepared with digital dictation and possible smart phrase technology. Any transcriptional errors that result from this process are unintentional.

## 2021-03-29 ENCOUNTER — Telehealth (HOSPITAL_COMMUNITY): Payer: Self-pay

## 2021-03-29 NOTE — Telephone Encounter (Signed)
Called to let pt know that she is know due for a f/u until April. I will call her back at the end of March when it is time to schedule. AW

## 2021-04-02 ENCOUNTER — Other Ambulatory Visit (HOSPITAL_COMMUNITY): Payer: Self-pay | Admitting: Internal Medicine

## 2021-04-02 DIAGNOSIS — M810 Age-related osteoporosis without current pathological fracture: Secondary | ICD-10-CM

## 2021-04-13 ENCOUNTER — Encounter (HOSPITAL_COMMUNITY): Payer: 59

## 2021-04-13 DIAGNOSIS — Z1231 Encounter for screening mammogram for malignant neoplasm of breast: Secondary | ICD-10-CM

## 2021-04-23 ENCOUNTER — Ambulatory Visit (HOSPITAL_COMMUNITY)
Admission: RE | Admit: 2021-04-23 | Discharge: 2021-04-23 | Disposition: A | Discharge status: Home / Self Care | Payer: 59 | Source: Ambulatory Visit | Attending: Internal Medicine | Admitting: Internal Medicine

## 2021-04-23 ENCOUNTER — Other Ambulatory Visit: Payer: Self-pay

## 2021-04-23 DIAGNOSIS — M810 Age-related osteoporosis without current pathological fracture: Secondary | ICD-10-CM | POA: Diagnosis present

## 2021-04-23 DIAGNOSIS — Z1231 Encounter for screening mammogram for malignant neoplasm of breast: Secondary | ICD-10-CM | POA: Insufficient documentation

## 2021-04-25 ENCOUNTER — Other Ambulatory Visit (HOSPITAL_COMMUNITY): Payer: Self-pay | Admitting: Interventional Radiology

## 2021-04-25 DIAGNOSIS — I7774 Dissection of vertebral artery: Secondary | ICD-10-CM

## 2021-05-28 ENCOUNTER — Other Ambulatory Visit: Payer: Self-pay | Admitting: Adult Health

## 2021-06-04 ENCOUNTER — Ambulatory Visit (HOSPITAL_COMMUNITY)
Admission: RE | Admit: 2021-06-04 | Discharge: 2021-06-04 | Disposition: A | Discharge status: Home / Self Care | Payer: 59 | Source: Ambulatory Visit | Attending: Interventional Radiology | Admitting: Interventional Radiology

## 2021-06-04 ENCOUNTER — Encounter (HOSPITAL_COMMUNITY): Payer: Self-pay | Admitting: Radiology

## 2021-06-04 DIAGNOSIS — I7774 Dissection of vertebral artery: Secondary | ICD-10-CM | POA: Insufficient documentation

## 2021-06-04 LAB — POCT I-STAT CREATININE: Creatinine, Ser: 0.9 mg/dL (ref 0.44–1.00)

## 2021-06-04 MED ORDER — IOHEXOL 350 MG/ML SOLN
100.0000 mL | Freq: Once | INTRAVENOUS | Status: AC | PRN
  Administered 2021-06-04: 75 mL via INTRAVENOUS

## 2021-06-11 ENCOUNTER — Telehealth (HOSPITAL_COMMUNITY): Payer: Self-pay

## 2021-06-11 NOTE — Telephone Encounter (Signed)
Pt agreed to f/u in October/November with cta head/neck. AW  ?

## 2021-07-28 IMAGING — US IR ANGIO INTRA EXTRACRAN SEL COM CAROTID INNOMINATE BILAT MOD SE
1 series · 2 of 2 positions shown · non-contrast
Comparison: CT angiogram of the head and neck July 02, 2019.

CLINICAL DATA: History of vertigo and dizziness with neck movement.
Abnormal CT angiogram of the head and neck.

EXAM:
BILATERAL COMMON CAROTID AND INNOMINATE ANGIOGRAPHY
TECHNIQUE: Informed written consent was obtained from the patient after a
thorough discussion of the procedural risks, benefits and
alternatives. All questions were addressed. Maximal Sterile Barrier
Technique was utilized including caps, mask, sterile gowns, sterile
gloves, sterile drape, hand hygiene and skin antiseptic. A timeout
was performed prior to the initiation of the procedure.

[Series 1: (id) · 2 of 2 slices shown]
[im 1/2]
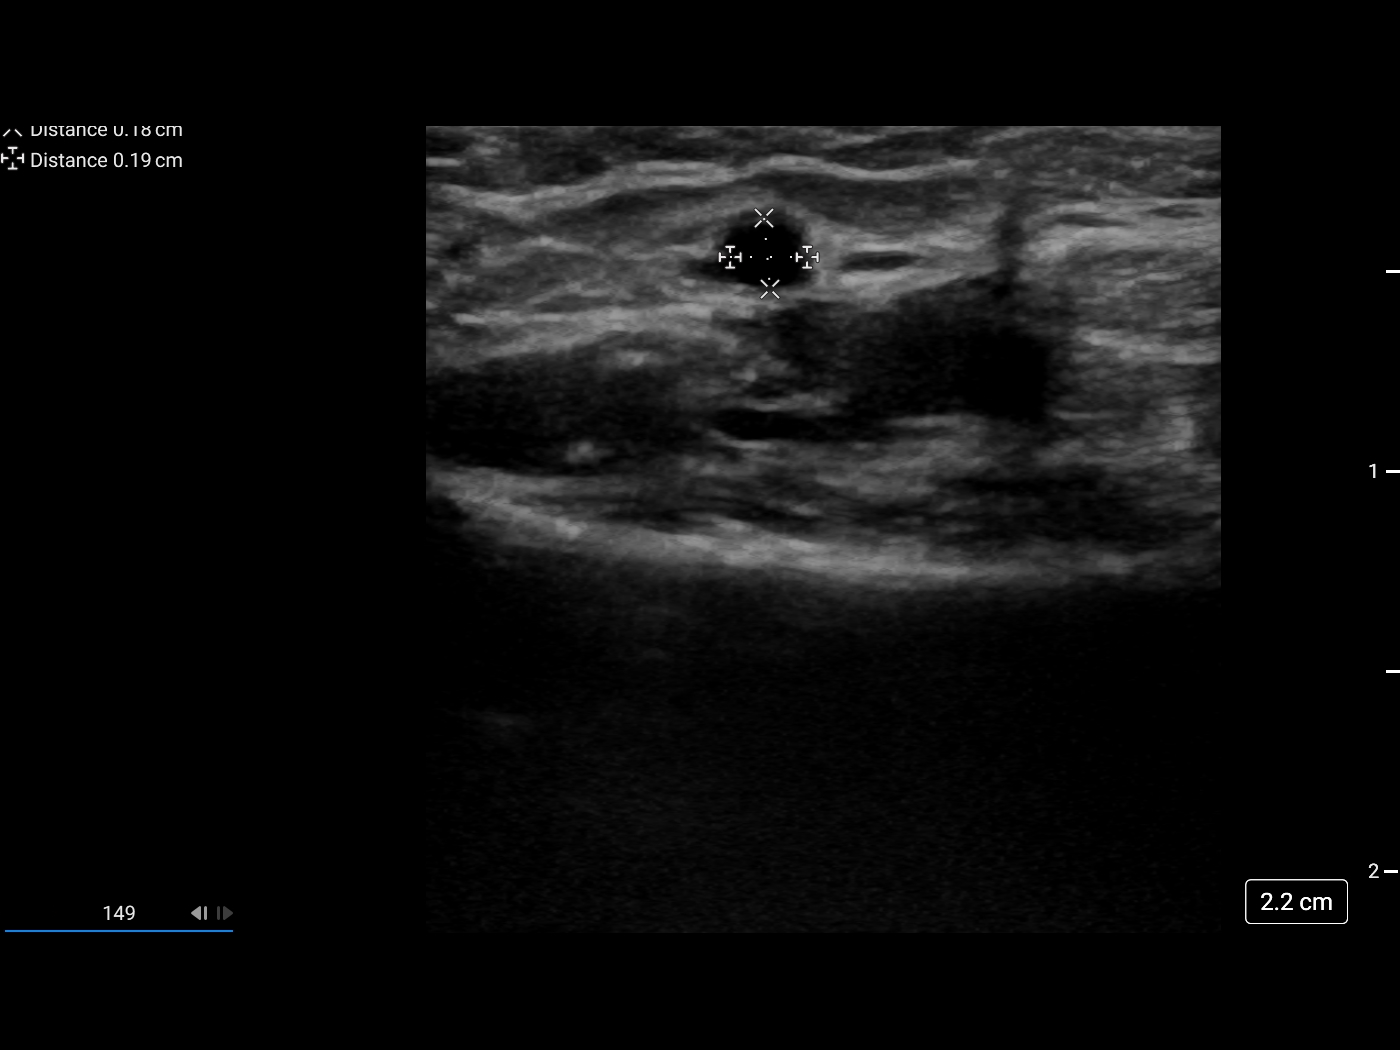
[im 2/2]
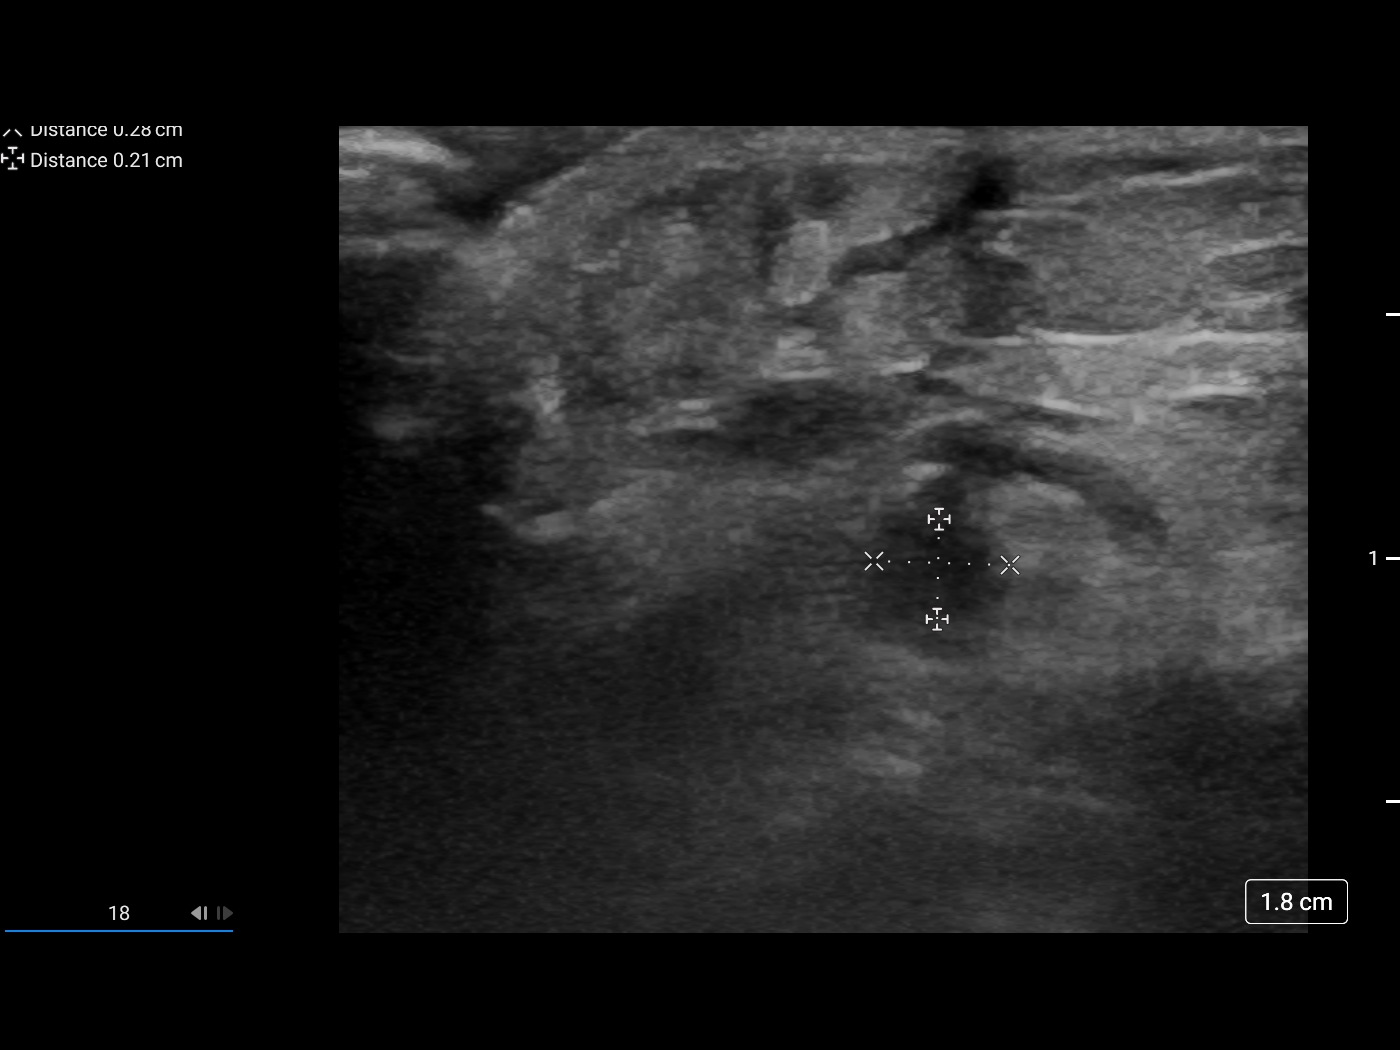

[2 of 2 positions shown; findings below may reference images not displayed]

MEDICATIONS:
Heparin 1,500 units IV; No antibiotic was administered within 1 hour
of the procedure.

ANESTHESIA/SEDATION:
Versed 1 mg IV; Fentanyl 50 mcg IV

Moderate Sedation Time:  60 minutes

The patient was continuously monitored during the procedure by the
interventional radiology nurse under my direct supervision.

CONTRAST:  Isovue 300 approximately 100 mL

FLUOROSCOPY TIME:  Fluoroscopy Time: 20 minutes 18 seconds (2987
mGy).

COMPLICATIONS:
None immediate.
The right groin was prepped and draped in the usual sterile fashion.
Thereafter using modified Seldinger technique, transfemoral access
into the right common femoral artery was obtained without
difficulty. Over a 0.035 inch guidewire, a 5 French Pinnacle sheath
was inserted. Through this, and also over 0.035 inch guidewire, a 5
French JB 1 catheter was advanced to the aortic arch region and
selectively positioned in the right common carotid artery, the right
subclavian artery, the left common carotid artery and the left
vertebral artery.

Also performed was a 3D rotational arteriogram of the left anterior
circulation via the left common carotid artery injection. The 3D
images were developed on a separate workstation.
FINDINGS: .:
FINDINGS: .
The origin of the right vertebral artery is widely patent. The
vessel is seen to opacify to the cranial skull base. Mild fusiform
dilatation is seen of the right vertebral artery at the level of C1
with two smooth channels noted above the posterior arch of C1 which
joined to form the right vertebral artery. This measures
approximately 15 mm.

More distally the opacified portion of the basilar artery, the
posterior cerebral arteries, the superior cerebellar arteries and
the anterior-inferior cerebellar arteries opacify into the capillary
and venous phases. Unopacified blood is seen in the basilar artery
from the contralateral vertebral artery.

Arteriograms were also performed with the patient's head turned to
the left, and to the right without any evidence of kinking or
slowing of hemodynamic ascent of contrast was noted. The patient did
not exhibit any clinical symptoms at this time.

The right common carotid arteriogram demonstrates the right external
carotid artery and its major branches to be widely patent.

The right internal carotid artery at the bulb to the cranial skull
base demonstrates wide patency.

The petrous, the cavernous and the supraclinoid segments are widely
patent.

The right middle cerebral artery and the right anterior cerebral
artery opacify into the capillary and venous phases.

The left common carotid arteriogram demonstrates the left external
carotid artery and its major branches to be widely patent.

The left internal carotid artery at the bulb to the cranial skull
base is widely patent.

The petrous, the cavernous and the supraclinoid segments are also
widely patent.

There is dysplastic fusiform dilatation of the caval cavernous
segment of the left internal carotid artery associated with a double
density.

A 3D rotational angiography with reconstruction on a separate
workstation confirms the presence of an approximately 4 mm x 3.5 mm
wide neck saccular aneurysm associated with dysplasia of the
internal carotid artery extending cranially.

The left ophthalmic artery is widely patent.

The left middle cerebral artery and the left anterior cerebral
artery opacify into the capillary and venous phases.

The dominant left vertebral artery is widely patent. The vessel has
mild tortuosity just distal to its origin. More distally the vessel
is seen to opacify to the cranial skull base. There is mild fusiform
dilatation of the first horizontal segment of the left vertebral
artery, with mild narrowing of the vertical segment and just distal
to this.

More distally the left vertebrobasilar junction and the left
posterior-inferior cerebellar artery are patent. The basilar artery,
the posterior cerebral arteries, the superior cerebellar arteries
and the anterior-inferior cerebellar arteries opacify into the
capillary and venous phases.
IMPRESSION: Splitting of the right vertebral artery at the level C1 for a
distance of 15 mm before rejoining to form the vertebral artery most
consistent with a fenestration. Relatively smooth arms of the
fenestration.

Associated mild proximal fusiform dilatation of the right vertebral
artery.

Approximately 4 mm x 3.5 mm saccular outpouching with wide neck at
the level of the left ophthalmic artery consistent with a
paraophthalmic aneurysm.

Associated fusiform dysplastic changes of the caval cavernous
segment of the left internal carotid artery. Mild FMD-like changes
of the left vertebral artery at the skull base.

PLAN:
Findings were reviewed with the patient. Patient wishes to discuss
the angiographic finding in the outpatient clinic accompanied by her
husband as soon as possible.

## 2021-07-30 NOTE — Progress Notes (Signed)
[facility] [address] [place]. [place] [address] [phone]       STROKE FOLLOW UP NOTE  [name] Date of Birth:  [birth year] Medical Record Number:  [id number]   Reason for Referral: stroke follow up    SUBJECTIVE:   CHIEF COMPLAINT:  Chief Complaint  Patient presents with   Follow-up    Rm 2 with spouse [name] Pt is well and stable, no new concerns      HPI:   Update 07/31/2021 JM: Patient returns for 53-month stroke follow-up accompanied by her husband.  Overall stable without new stroke/TIA symptoms.  Continued left hemiparesis, stable since prior visit. Also notes continued left sided swelling and sensory impairment.  More recently, experiencing occasional left foot pain/needle sensation more so when resting or laying in bed. Continued use of cane for short distance and RW long distance, denies any recent falls. Does report continued occasional pressure behind left eye, unchanged. Continues to do as much activity as she is able to around home but continues to fatigue quicker and needs to take breaks more frequently.  Remains on long-term disability and Social Security disability.  Compliant on aspirin and atorvastatin, denies side effects. Blood pressure today 141/95 - monitors at home, typically 130s/80s.  Seen by PCP last month, has not had recent lab work, has f/u visit in August  Has been working on trying to lose weight with eating healthier.  Typically eats 2 meals per day. Drinks plenty of water.   Completed CTA head/neck 06/2021 with Dr. 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  No further concerns at this time.     History provided for reference purposes only Update 01/30/2021 JM: Kimberly [name] returns for stroke follow-up with husband [name]. Denies new or worsening stroke/TIA symptoms.  Continued left hemiparesis with subjective  numbness stable.  Intermittent left-sided swelling (not new). Dizziness/vertigo stable. She is using a cane for short distance and Rollator walker for longer distances and denies recent falls. Continues using AFO soft brace LLE.  Able to perform ADLs and trying increase house chores but will need to take frequent breaks. Remains on Aspirin and Atorvastatin without side effects. BP today 144/99. Reports recent labs by PCP - reports improvement of cholesterol (unable to view via epci). Social security disability obtained - remains on LTD thru [employer] (prior employer). Repeat MR brain and MRA 11/2020 completed by Dr. Corliss Skains showed stable appearance compared to prior imaging 03/2020. Plans to repeat 03/2021.  No further/new concerns at this time  Update 07/31/2020 JM: Kimberly [name] returns for 35-month stroke follow-up accompanied by her husband, [name].  She has been stable without new stroke/TIA symptoms.  Reports residual intermittent dizziness/vertigo and left-sided weakness.  Intermittent use of meclizine for vertigo with benefit.  She has been using Rollator walker to assist with ambulation without any recent falls.  She is currently in the process of applying for Social Security disability with initial denial and currently in appeal process working with an Pensions consultant.  She has been trying to increase her daily activity and independence but does still need assistance by her husband.  She also reports occasional left orbital headache since aneurysm procedure but this is been gradually improving.  She has since completed DAPT and remains on aspirin 325 mg daily as well as atorvastatin without associated side effects.  Blood pressure today 142/100 -reports routinely monitoring at home and SBP typically 130s.  Follow-up with Dr. Corliss Skains with repeat imaging 03/2020 showed several chronic microhemorrhages within the left cerebral hemisphere new compared to prior imaging and stable appearance treated aneurysm.  Recommended  repeat imaging around 09/2020.  She also reports occasionally smelling "carbon monoxide" describing as exhaust fumes since she was diagnosed with influenza on 4/9 -she does endorse continued congestion with cough and postnasal drip since that time.  No further concerns at this time.   Update 01/31/2020 Dr. [name]: She returns for follow-up after last visit 3 months ago.  She is accompanied by her husband.  She has not had any recurrent stroke or TIA symptoms.  She continues to have intermittent dizziness and takes meclizine which seems to help.  She walks with a left ankle brace as well as of dragging her left foot and using a cane.  She can get by indoors in her house by holding onto walls and furniture.  She underwent diagnostic cerebral catheter angiogram by Dr. 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  Patient had a strong family history of multiple family members with cerebral aneurysms including ruptured once and she underwent elective endovascular aneurysm treatment with Dr. Corliss Skains on 12/06/2019 using a flow diverted device.  Procedure went well and she has had no complications.  She continues to have dizziness as well as left leg dragging and stiffness.  She walks with a ankle brace and uses a cane.  She has had no falls or injuries.  She remains on aspirin and Plavix which is tolerating well without side effects.  She does not smoke  cigarettes.  She states her blood pressures well controlled today it is elevated in office at 156/93.   Update 10/20/2019 JM: Kimberly [name] returns for follow-up regarding R VA dissection in 06/2019 accompanied by her husband  She continues to experience left-sided deficits, vertigo and gait impairment.  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 Noted as finding remains unchanged over time frame a congenital fenestration would be more likely per radiology report  She remains on both aspirin and Plavix without bleeding or bruising Remains on atorvastatin 80 mg daily without myalgias Blood pressure today 144/86  No further concerns at this time  Initial visit 07/27/2019 JM: She presents today for follow up regarding recent admission for stroke accompanied by her husband.  Since discharge, reports continued left-sided weakness, gait impairment and episodes of dizziness/vertigo worsened with head movement.  She also complains of neck pain  and left orbital headache which has been persistent since hospitalization.  Denies new or worsening stroke/TIA symptoms.  Currently working with PT and recently referred to OT by PCP and is currently waiting to schedule visits.  Ambulates with cane and denies any recent falls.  She has not returned back to work at [employer] and plans on obtaining short-term disability through PCP.  Has remained on DAPT  without bleeding or bruising.  Continues on atorvastatin 80 mg daily.  Blood pressure today 127/89.  No further concerns at this time.  Stroke admission Ms. Rudi Grandberry is a 56 y.o. female with history of fibromyalgia and IBS  who presented on 07/02/2019 with  Vertigo, nausea, vomiting and left sided numbness / weakness.  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  Recommended to initiate DAPT for 3 months.  Recommended 4 to 6-week follow-up imaging in regards to possible dissection.  HTN stable.  LDL 191 initiate atorvastatin 80 mg daily.  Other stroke risk factors include prior EtOH use and obesity but no prior stroke history.  Discharged home in stable condition without therapy needs.  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  No mass or hemorrhage. Mucosal thickening noted in several ethmoid air cells. Probable cerumen in the right external auditory canal.  MRI head - Negative for acute infarct. Mild chronic white matter changes most likely due to small vessel ischemia.  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 MRI recommended to exclude acute infarct.  CT Perfusion - not ordered Carotid Doppler - CTA neck performed - carotid dopplers not indicated. 2D Echo - EF 55-60 Loyal Jacobson Virus 2  - negative LDL - 191 HgbA1c - 5.3     ROS:   14 system review of systems performed and negative with exception of those listed in HPI  PMH:  Past Medical History:  Diagnosis Date   Anxiety    COPD (chronic obstructive pulmonary disease) (HCC)    mild - no inhaler   Dizziness     Fibromyalgia    Headache    HLD (hyperlipidemia)    Hypertension    IBS (irritable bowel syndrome)    Insomnia    Stroke (HCC) 06/27/2019    PSH:  Past Surgical History:  Procedure Laterality Date   CHOLECYSTECTOMY  2003   COLONOSCOPY     IR 3D INDEPENDENT WKST  11/23/2019   IR ANGIO INTRA EXTRACRAN SEL COM CAROTID INNOMINATE BILAT MOD SED  11/23/2019   IR ANGIO INTRA EXTRACRAN SEL INTERNAL CAROTID UNI L MOD SED  12/06/2019   IR ANGIO VERTEBRAL SEL SUBCLAVIAN INNOMINATE UNI R MOD SED  11/23/2019   IR ANGIO VERTEBRAL SEL VERTEBRAL UNI L MOD SED  11/23/2019   IR ANGIOGRAM FOLLOW UP STUDY  12/06/2019   IR RADIOLOGIST EVAL & MGMT  01/12/2020   IR TRANSCATH/EMBOLIZ  12/06/2019   LAPAROSCOPY  2003   removal of kidney cyst   RADIOLOGY WITH ANESTHESIA N/A 12/06/2019   Procedure: IR WITH ANESTHESIA EMBOLIZATION;  Surgeon: Julieanne Cotton, MD;  Location: [facility] OR;  Service: Radiology;  Laterality: N/A;   vaginal hysterectiny  04/1999  Social History:  Social History   Socioeconomic History   Marital status: Married    Spouse name: [name]   Number of children: Not on file   Years of education: Not on file   Highest education level: Not on file  Occupational History   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[employer]  Tobacco Use   Smoking status: Passive Smoke Exposure - Never Smoker   Smokeless tobacco: Never  Vaping Use   Vaping Use: Never used  Substance and Sexual Activity   Alcohol use: Not Currently   Drug use: Never   Sexual activity: Yes    Partners: Male    Birth control/protection: Surgical    Comment: Hysterectomy  Other Topics Concern   Not on file  Social History Narrative   Lives with husband   Right Handed   Drinks 2-3 cups caffeine daily   Social Determinants of Health   Financial Resource Strain: Not on file  Food Insecurity: Not on file  Transportation Needs: Not on file  Physical Activity: Not on file  Stress: Not on file  Social Connections: Not on file  Intimate  Partner Violence: Not on file    Family History: History reviewed. No pertinent family history.  Medications:   Current Outpatient Medications on File Prior to Visit  Medication Sig Dispense Refill   aspirin 81 MG EC tablet Take 4 tablets (325 mg total) by mouth daily. 30 tablet 3   atorvastatin (LIPITOR) 80 MG tablet Take 1 tablet by mouth once daily 90 tablet 1   diphenhydramine-acetaminophen (TYLENOL PM) 25-500 MG TABS tablet Take 1 tablet by mouth at bedtime as needed (sleep).     Meclizine HCl 25 MG CHEW Chew 25 mg by mouth 3 (three) times daily as needed (vertigo).     metoprolol tartrate (LOPRESSOR) 25 MG tablet Take 25 mg by mouth 2 (two) times daily.     Multiple Vitamins-Minerals (ONE-A-DAY VITACRAVES ADULT PO) Take by mouth.     ondansetron (ZOFRAN) 4 MG tablet Take 4 mg by mouth every 8 (eight) hours as needed for nausea or vomiting.     tiZANidine (ZANAFLEX) 2 MG tablet Take by mouth every 6 (six) hours as needed for muscle spasms.     No current facility-administered medications on file prior to visit.    Allergies:   Allergies  Allergen Reactions   Duloxetine Hcl Other (See Comments)    Urinary retention       OBJECTIVE:  Physical Exam  Vitals:   07/31/21 1032  BP: (!) 141/95  Pulse: 65  Weight: 197 lb (89.4 kg)  Height: 5\' 4"  (1.626 m)    Body mass index is 33.81 kg/m. No results found.  General: Mildly obese very pleasant middle-aged, Caucasian female, seated, in no evident distress Head: head normocephalic and atraumatic.   Neck: supple with no carotid or supraclavicular bruits Cardiovascular: regular rate and rhythm, no murmurs Musculoskeletal: no deformity Skin:  no rash/petichiae Vascular:  Normal pulses all extremities   Neurologic Exam Mental Status: Awake and fully alert.  Fluent speech and language.  Oriented to place and time. Recent and remote memory intact. Attention span, concentration and fund of knowledge appropriate. Mood and  affect appropriate.  Cranial Nerves: Pupils equal, briskly reactive to light. Extraocular movements full without nystagmus. Visual fields full to confrontation. Hearing intact. Facial sensation intact. Face, tongue, palate moves normally and symmetrically.  Motor: Normal bulk and tone. Full strength right upper and lower extremity.  Mild left-sided weakness although difficulty fully assessing d/t  giveaway weakness; limited left shoulder ROM; use of AFO brace for ambulation  Sensory: Hypersensitivity with light touch and vibratory sensation left upper and lower extremity Coordination: Rapid alternating movements normal in all extremities except decreased left hand. Finger-to-nose slowed movement on left but no evidence of ataxia or dysmetria and heel-to-shin performed accurately on right side with difficulty on the left Gait and Station: Arises from chair with mild difficulty. Stance is normal. Gait demonstrates normal stride length and decreased stride length, mild imbalance and use of cane and AFO brace.  Tandem walk and heel toe not attempted Reflexes: 1+ and symmetric. Toes downgoing.        ASSESSMENT: Kimberly [name] is a 56 y.o. 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  MRI negative for stroke but persistent deficits suggest small brainstem infarct not visualized on MRI.  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  Incidental left paraophthalmic artery wide neck mildly bilobed irregular left paraophthalmic region aneurysm was successfully endovascularly treated with placement of a 4.5 mm x 17 mm Evolve flow diverter device by Dr. Corliss Skains on 12/06/2019.  She has strong family history  of ruptured cerebral aneurysms multiple family members. Vascular risk factors include fibromyalgia, HLD and prior EtOH use.      PLAN:  1.  Small brainstem stroke -Residual deficits: Left hemiparesis with sensory impairment occasional dysesthesias, gait difficulties and intermittent vertigo.   -Recommend trialing gabapentin 300 mg nightly for dysesthesias.  Discussed potential side effects and to call with any side effects for possible need of dosage increase.  If unable to tolerate, can consider trying amitriptyline.  Prior intolerance to duloxetine with urinary retention -Discussed use of compression sleeve and sock which can help with dysesthesias and swelling sensation -Continued use of AD at all times unless otherwise instructed -Obtained Social Security disability and remains on LTD from [employer]  -Continue aspirin and atorvastatin 80 mg daily for secondary stroke prevention -Discussed secondary stroke prevention measures and importance of close PCP follow-up for aggressive stroke risk factor management including HLD with LDL goal<70 -has f/u with PCP in August - request lab work be faxed to office once completed  2.  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 Corliss Skains -Continued occasional left orbital headache    Follow-up in 6 months or call earlier if needed     CC:  Hasanaj, Myra Gianotti, MD   I spent 38 minutes of face-to-face and non-face-to-face time with patient and husband.  This included previsit chart review, lab review, study review, electronic health record documentation, and patient and husband education and discussion regarding history of prior stroke and residual deficits, secondary stroke prevention measures and importance of aggressive stroke risk factor management, and answered all other questions to patient and husband satisfaction  Ihor Austin, AGNP-BC  [facility] [address] [place], [place] [address]  Phone [phone] Fax [phone] Note: This document was prepared with digital dictation and possible smart phrase technology. Any transcriptional errors that result from this process are unintentional.

## 2021-07-31 ENCOUNTER — Encounter: Payer: Self-pay | Admitting: Adult Health

## 2021-07-31 ENCOUNTER — Ambulatory Visit: Payer: 59 | Admitting: Adult Health

## 2021-07-31 VITALS — BP 141/95 | HR 65 | Ht 64.0 in | Wt 197.0 lb

## 2021-07-31 DIAGNOSIS — R269 Unspecified abnormalities of gait and mobility: Secondary | ICD-10-CM

## 2021-07-31 DIAGNOSIS — I69354 Hemiplegia and hemiparesis following cerebral infarction affecting left non-dominant side: Secondary | ICD-10-CM

## 2021-07-31 DIAGNOSIS — I69398 Other sequelae of cerebral infarction: Secondary | ICD-10-CM

## 2021-07-31 DIAGNOSIS — I7774 Dissection of vertebral artery: Secondary | ICD-10-CM

## 2021-07-31 DIAGNOSIS — I639 Cerebral infarction, unspecified: Secondary | ICD-10-CM | POA: Diagnosis not present

## 2021-07-31 DIAGNOSIS — I671 Cerebral aneurysm, nonruptured: Secondary | ICD-10-CM

## 2021-07-31 MED ORDER — GABAPENTIN 300 MG PO CAPS: 300.0000 mg | ORAL_CAPSULE | Freq: Every day | ORAL | 5 refills | Status: DC

## 2021-09-27 ENCOUNTER — Encounter: Payer: Self-pay | Admitting: Adult Health

## 2021-10-01 NOTE — Telephone Encounter (Signed)
If she is asking about Lexapro and gabapentin, there is no contraindication between taking both. Thank you.

## 2021-10-11 ENCOUNTER — Telehealth: Payer: Self-pay | Admitting: *Deleted

## 2021-10-11 NOTE — Telephone Encounter (Signed)
Faxed [employer] disability papers with last 2 office notes, 06/04/21 CTA head/neck reports. Received confirmation. Sent my chart to advise patient. Paperwork sent to medical records to be scanned into her EMR.

## 2021-10-11 NOTE — Telephone Encounter (Signed)
Received [employer] disability form, placed on NP desk for review, signature.

## 2021-10-11 NOTE — Telephone Encounter (Signed)
Signed and placed in outbox.  Thank you. ?

## 2021-10-11 NOTE — Telephone Encounter (Signed)
[employer] from faxed on 10/11/21

## 2021-10-30 DIAGNOSIS — Z0289 Encounter for other administrative examinations: Secondary | ICD-10-CM

## 2021-11-16 ENCOUNTER — Other Ambulatory Visit: Payer: Self-pay | Admitting: Adult Health

## 2021-11-29 ENCOUNTER — Encounter: Payer: Self-pay | Admitting: Adult Health

## 2021-12-06 DIAGNOSIS — G458 Other transient cerebral ischemic attacks and related syndromes: Secondary | ICD-10-CM | POA: Diagnosis not present

## 2021-12-06 DIAGNOSIS — E785 Hyperlipidemia, unspecified: Secondary | ICD-10-CM | POA: Diagnosis not present

## 2021-12-06 DIAGNOSIS — Z6831 Body mass index (BMI) 31.0-31.9, adult: Secondary | ICD-10-CM | POA: Diagnosis not present

## 2021-12-06 DIAGNOSIS — R69 Illness, unspecified: Secondary | ICD-10-CM | POA: Diagnosis not present

## 2021-12-06 DIAGNOSIS — I10 Essential (primary) hypertension: Secondary | ICD-10-CM | POA: Diagnosis not present

## 2021-12-25 ENCOUNTER — Telehealth: Payer: Self-pay | Admitting: Adult Health

## 2021-12-25 NOTE — Telephone Encounter (Signed)
[employer] (Dr Ileene Musa) refaxing letter  to your office. Can call me or return the fax.

## 2021-12-31 NOTE — Telephone Encounter (Signed)
Long Term Disability paperwork faxed to [employer](fax#[phone]) on 11/27

## 2022-01-04 DIAGNOSIS — F329 Major depressive disorder, single episode, unspecified: Secondary | ICD-10-CM | POA: Diagnosis not present

## 2022-01-04 DIAGNOSIS — K5900 Constipation, unspecified: Secondary | ICD-10-CM | POA: Diagnosis not present

## 2022-01-04 DIAGNOSIS — F419 Anxiety disorder, unspecified: Secondary | ICD-10-CM | POA: Diagnosis not present

## 2022-01-04 DIAGNOSIS — I10 Essential (primary) hypertension: Secondary | ICD-10-CM | POA: Diagnosis not present

## 2022-01-04 DIAGNOSIS — R69 Illness, unspecified: Secondary | ICD-10-CM | POA: Diagnosis not present

## 2022-01-04 DIAGNOSIS — J449 Chronic obstructive pulmonary disease, unspecified: Secondary | ICD-10-CM | POA: Diagnosis not present

## 2022-01-04 DIAGNOSIS — Z9181 History of falling: Secondary | ICD-10-CM | POA: Diagnosis not present

## 2022-01-04 DIAGNOSIS — I69354 Hemiplegia and hemiparesis following cerebral infarction affecting left non-dominant side: Secondary | ICD-10-CM | POA: Diagnosis not present

## 2022-01-04 DIAGNOSIS — Z809 Family history of malignant neoplasm, unspecified: Secondary | ICD-10-CM | POA: Diagnosis not present

## 2022-01-04 DIAGNOSIS — Z008 Encounter for other general examination: Secondary | ICD-10-CM | POA: Diagnosis not present

## 2022-01-04 DIAGNOSIS — Z8249 Family history of ischemic heart disease and other diseases of the circulatory system: Secondary | ICD-10-CM | POA: Diagnosis not present

## 2022-01-04 DIAGNOSIS — E785 Hyperlipidemia, unspecified: Secondary | ICD-10-CM | POA: Diagnosis not present

## 2022-01-04 DIAGNOSIS — G629 Polyneuropathy, unspecified: Secondary | ICD-10-CM | POA: Diagnosis not present

## 2022-01-08 DIAGNOSIS — Z6831 Body mass index (BMI) 31.0-31.9, adult: Secondary | ICD-10-CM | POA: Diagnosis not present

## 2022-01-08 DIAGNOSIS — E785 Hyperlipidemia, unspecified: Secondary | ICD-10-CM | POA: Diagnosis not present

## 2022-01-08 DIAGNOSIS — I10 Essential (primary) hypertension: Secondary | ICD-10-CM | POA: Diagnosis not present

## 2022-01-08 DIAGNOSIS — G458 Other transient cerebral ischemic attacks and related syndromes: Secondary | ICD-10-CM | POA: Diagnosis not present

## 2022-01-08 DIAGNOSIS — R69 Illness, unspecified: Secondary | ICD-10-CM | POA: Diagnosis not present

## 2022-01-10 ENCOUNTER — Encounter: Payer: Self-pay | Admitting: Adult Health

## 2022-01-15 NOTE — Telephone Encounter (Signed)
[employer] form updated and given to Stanton Kidney in Medical Records.

## 2022-01-15 NOTE — Telephone Encounter (Signed)
Forms faxed to [facility] (fax# [phone])

## 2022-01-30 ENCOUNTER — Ambulatory Visit: Payer: 59 | Admitting: Adult Health

## 2022-03-19 DIAGNOSIS — R42 Dizziness and giddiness: Secondary | ICD-10-CM | POA: Diagnosis not present

## 2022-03-19 DIAGNOSIS — Z8249 Family history of ischemic heart disease and other diseases of the circulatory system: Secondary | ICD-10-CM | POA: Diagnosis not present

## 2022-03-19 DIAGNOSIS — E785 Hyperlipidemia, unspecified: Secondary | ICD-10-CM | POA: Diagnosis not present

## 2022-03-19 DIAGNOSIS — M797 Fibromyalgia: Secondary | ICD-10-CM | POA: Diagnosis not present

## 2022-03-19 DIAGNOSIS — Z6832 Body mass index (BMI) 32.0-32.9, adult: Secondary | ICD-10-CM | POA: Diagnosis not present

## 2022-03-19 DIAGNOSIS — R69 Illness, unspecified: Secondary | ICD-10-CM | POA: Diagnosis not present

## 2022-03-19 DIAGNOSIS — Z800 Family history of malignant neoplasm of digestive organs: Secondary | ICD-10-CM | POA: Diagnosis not present

## 2022-03-19 DIAGNOSIS — E669 Obesity, unspecified: Secondary | ICD-10-CM | POA: Diagnosis not present

## 2022-03-19 DIAGNOSIS — I10 Essential (primary) hypertension: Secondary | ICD-10-CM | POA: Diagnosis not present

## 2022-03-19 DIAGNOSIS — R32 Unspecified urinary incontinence: Secondary | ICD-10-CM | POA: Diagnosis not present

## 2022-03-19 DIAGNOSIS — I69354 Hemiplegia and hemiparesis following cerebral infarction affecting left non-dominant side: Secondary | ICD-10-CM | POA: Diagnosis not present

## 2022-04-09 ENCOUNTER — Encounter: Payer: Self-pay | Admitting: Adult Health

## 2022-04-16 ENCOUNTER — Telehealth: Payer: Self-pay | Admitting: Adult Health

## 2022-04-17 DIAGNOSIS — Z8673 Personal history of transient ischemic attack (TIA), and cerebral infarction without residual deficits: Secondary | ICD-10-CM | POA: Diagnosis not present

## 2022-04-17 DIAGNOSIS — Z6830 Body mass index (BMI) 30.0-30.9, adult: Secondary | ICD-10-CM | POA: Diagnosis not present

## 2022-04-17 DIAGNOSIS — F408 Other phobic anxiety disorders: Secondary | ICD-10-CM | POA: Diagnosis not present

## 2022-04-17 DIAGNOSIS — E7849 Other hyperlipidemia: Secondary | ICD-10-CM | POA: Diagnosis not present

## 2022-04-17 DIAGNOSIS — G458 Other transient cerebral ischemic attacks and related syndromes: Secondary | ICD-10-CM | POA: Diagnosis not present

## 2022-04-17 DIAGNOSIS — I10 Essential (primary) hypertension: Secondary | ICD-10-CM | POA: Diagnosis not present

## 2022-04-17 DIAGNOSIS — R69 Illness, unspecified: Secondary | ICD-10-CM | POA: Diagnosis not present

## 2022-04-17 DIAGNOSIS — Z131 Encounter for screening for diabetes mellitus: Secondary | ICD-10-CM | POA: Diagnosis not present

## 2022-04-17 DIAGNOSIS — E785 Hyperlipidemia, unspecified: Secondary | ICD-10-CM | POA: Diagnosis not present

## 2022-04-17 DIAGNOSIS — Z0000 Encounter for general adult medical examination without abnormal findings: Secondary | ICD-10-CM | POA: Diagnosis not present

## 2022-04-24 NOTE — Progress Notes (Unsigned)
[facility] [address] [place]. [place] [address] [phone]       STROKE FOLLOW UP NOTE  Ms. Mechel Patras Date of Birth:  Jun 23, 1965 Medical Record Number:  [id number]   Reason for Referral: stroke follow up  Virtual Visit via Video Note  I connected with Georgina Pillion on 04/25/22 at  9:45 AM EDT by a video enabled telemedicine application and verified that I am speaking with the correct person using two identifiers.  Location: Patient: at home Provider: in office   I discussed the limitations of evaluation and management by telemedicine and the availability of in person appointments. The patient expressed understanding and agreed to proceed.   SUBJECTIVE:   CHIEF COMPLAINT:  Stroke f/u    HPI:   Update 04/25/2022 JM: Returns for stroke follow-up visit via MyChart video visit.  Overall stable from stroke standpoint without new stroke/TIA symptoms.  Continued left hemiparesis with sensory impairment and dysesthesias.  Reports a fall couple weeks ago after bending down to pick up cat, upon standing back up, left leg gave out on her, landed on elbow and hip, denies hitting head. No additional falls since then. She is relatively sedentary, tries to walk around her home and do some IADLs but fatigues quickly. Husband works during the day so she is afraid of doing too much without his supervision due to fear of falling. Continues dysesthesias left food and hand, improvement on gabapentin 300mg  nightly. Was having issues with anxiety and noticed increased headaches and dizziness with high anxiety, PCP started Lexapro with improvement.  She also mentions having events where her mind will go blank and can't speak, only lasts short duration. Occurs when having conversations, can still hear what the person is saying to her but unable to respond. Does not lose consciousness or has altered mental status. Does not feel confusion or fatigue after. This has been occurring  since her stroke but hasn't previously mentioned this.   Compliant on aspirin and atorvastatin Blood pressure well-controlled Routinely followed by PCP      History provided for reference purposes only Update 07/31/2021 JM: Patient returns for 57-month stroke follow-up accompanied by her husband.  Overall stable without new stroke/TIA symptoms.  Continued left hemiparesis, stable since prior visit. Also notes continued left sided swelling and sensory impairment.  More recently, experiencing occasional left foot pain/needle sensation more so when resting or laying in bed. Continued use of cane for short distance and RW long distance, denies any recent falls. Does report continued occasional pressure behind left eye, unchanged. Continues to do as much activity as she is able to around home but continues to fatigue quicker and needs to take breaks more frequently.  Remains on long-term disability and Social Security disability.  Compliant on aspirin and atorvastatin, denies side effects. Blood pressure today 141/95 - monitors at home, typically 130s/80s.  Seen by PCP last month, has not had recent lab work, has f/u visit in August  Has been working on trying to lose weight with eating healthier.  Typically eats 2 meals per day. Drinks plenty of water.   Completed CTA head/neck 06/2021 with Dr. 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  No further concerns at this time.  Update 01/30/2021 JM: Ms. Ciaravino returns for stroke follow-up with husband Ulice Dash. Denies new or worsening stroke/TIA symptoms.  Continued left hemiparesis with subjective numbness stable.  Intermittent left-sided swelling (not new). Dizziness/vertigo  stable. She is using a cane for short distance and Rollator walker for longer distances and denies recent falls. Continues using AFO soft brace LLE.  Able to perform ADLs and trying  increase house chores but will need to take frequent breaks. Remains on Aspirin and Atorvastatin without side effects. BP today 144/99. Reports recent labs by PCP - reports improvement of cholesterol (unable to view via epci). Social security disability obtained - remains on LTD thru [employer] (prior employer). Repeat MR brain and MRA 11/2020 completed by Dr. Estanislado Pandy showed stable appearance compared to prior imaging 03/2020. Plans to repeat 03/2021.  No further/new concerns at this time  Update 07/31/2020 JM: Ms. Olds returns for 57-month stroke follow-up accompanied by her husband, Ulice Dash.  She has been stable without new stroke/TIA symptoms.  Reports residual intermittent dizziness/vertigo and left-sided weakness.  Intermittent use of meclizine for vertigo with benefit.  She has been using Rollator walker to assist with ambulation without any recent falls.  She is currently in the process of applying for Social Security disability with initial denial and currently in appeal process working with an Forensic psychologist.  She has been trying to increase her daily activity and independence but does still need assistance by her husband.  She also reports occasional left orbital headache since aneurysm procedure but this is been gradually improving.  She has since completed DAPT and remains on aspirin 325 mg daily as well as atorvastatin without associated side effects.  Blood pressure today 142/100 -reports routinely monitoring at home and SBP typically 130s.  Follow-up with Dr. Estanislado Pandy with repeat imaging 03/2020 showed several chronic microhemorrhages within the left cerebral hemisphere new compared to prior imaging and stable appearance treated aneurysm.  Recommended repeat imaging around 09/2020.  She also reports occasionally smelling "carbon monoxide" describing as exhaust fumes since she was diagnosed with influenza on 4/9 -she does endorse continued congestion with cough and postnasal drip since that time.  No further  concerns at this time.   Update 01/31/2020 Dr. Leonie Man: She returns for follow-up after last visit 3 months ago.  She is accompanied by her husband.  She has not had any recurrent stroke or TIA symptoms.  She continues to have intermittent dizziness and takes meclizine which seems to help.  She walks with a left ankle brace as well as of dragging her left foot and using a cane.  She can get by indoors in her house by holding onto walls and furniture.  She underwent diagnostic cerebral catheter angiogram by Dr. 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  Patient had a strong family history of multiple family members with cerebral aneurysms including ruptured once and she underwent elective endovascular aneurysm treatment with Dr. Estanislado Pandy on 12/06/2019 using a flow diverted device.  Procedure went well and she has had no complications.  She continues to have dizziness as well as left leg dragging and stiffness.  She walks with a ankle brace and uses a cane.  She has had no falls or injuries.  She remains on aspirin and Plavix which is tolerating well without side effects.  She does not smoke cigarettes.  She states her blood pressures well controlled today it is elevated in office at 156/93.   Update 10/20/2019 JM: Ms. Sandvik returns for follow-up regarding R VA dissection in 06/2019 accompanied by her husband  She continues to experience left-sided  deficits, vertigo and gait impairment.  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 Noted as finding remains unchanged over time frame a congenital fenestration would be more likely per radiology report  She remains on both aspirin and Plavix without bleeding or bruising Remains on atorvastatin 80 mg daily without myalgias Blood pressure today 144/86  No further concerns at this time  Initial visit 07/27/2019 JM: She presents today for follow up regarding recent admission for stroke accompanied by her husband.  Since discharge, reports continued left-sided weakness, gait impairment and episodes of dizziness/vertigo worsened with head movement.  She also complains of neck pain and left orbital headache which has been persistent since hospitalization.  Denies new or worsening stroke/TIA symptoms.  Currently working with PT and recently referred to OT by PCP and is currently waiting to schedule visits.  Ambulates with cane and denies any recent falls.  She has not returned back to work at [employer] and plans on obtaining short-term disability through PCP.  Has remained on DAPT without bleeding or bruising.  Continues on atorvastatin 80 mg daily.  Blood pressure today 127/89.  No further concerns at this time.  Stroke admission Ms. Angelena Kiraly is a 57 y.o. female with history of fibromyalgia and IBS  who presented on 07/02/2019 with  Vertigo,  nausea, vomiting and left sided numbness / weakness.  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  Recommended to initiate DAPT for 3 months.  Recommended 4 to 6-week follow-up imaging in regards to possible dissection.  HTN stable.  LDL 191 initiate atorvastatin 80 mg daily.  Other stroke risk factors include prior EtOH use and obesity but no prior stroke history.  Discharged home in stable condition without therapy needs.  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  No mass or hemorrhage. Mucosal thickening noted in several ethmoid air cells. Probable cerumen in the right external auditory canal.  MRI head - Negative for acute infarct. Mild chronic white matter changes most likely due to small vessel ischemia.  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 MRI recommended to exclude acute infarct.  CT Perfusion - not ordered Carotid Doppler - CTA neck performed - carotid dopplers not indicated. 2D  Echo - EF 55-60 [employer] Virus 2  - negative LDL - 191 HgbA1c - 5.3     ROS:   14 system review of systems performed and negative with exception of those listed in HPI  PMH:  Past Medical History:  Diagnosis Date   Anxiety    COPD (chronic obstructive pulmonary disease) (HCC)    mild - no inhaler   Dizziness    Fibromyalgia    Headache    HLD (hyperlipidemia)    Hypertension    IBS (irritable bowel syndrome)    Insomnia    Stroke ([place]) 06/27/2019    PSH:  Past Surgical History:  Procedure Laterality Date   CHOLECYSTECTOMY  2003   COLONOSCOPY     IR 3D INDEPENDENT WKST   11/23/2019   IR ANGIO INTRA EXTRACRAN SEL COM CAROTID INNOMINATE BILAT MOD SED  11/23/2019   IR ANGIO INTRA EXTRACRAN SEL INTERNAL CAROTID UNI L MOD SED  12/06/2019   IR ANGIO VERTEBRAL SEL SUBCLAVIAN INNOMINATE UNI R MOD SED  11/23/2019   IR ANGIO VERTEBRAL SEL VERTEBRAL UNI L MOD SED  11/23/2019   IR ANGIOGRAM FOLLOW UP STUDY  12/06/2019   IR RADIOLOGIST EVAL & MGMT  01/12/2020   IR TRANSCATH/EMBOLIZ  12/06/2019   LAPAROSCOPY  2003   removal of kidney cyst   RADIOLOGY WITH ANESTHESIA N/A 12/06/2019   Procedure: IR WITH ANESTHESIA EMBOLIZATION;  Surgeon: Luanne Bras, MD;  Location: [facility];  Service: Radiology;  Laterality: N/A;   vaginal hysterectiny  04/1999    Social History:  Social History   Socioeconomic History   Marital status: Married    Spouse name: Ulice Dash   Number of children: Not on file   Years of education: Not on file   Highest education level: Not on file  Occupational History   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[employer]  Tobacco Use   Smoking status: Passive Smoke Exposure - Never Smoker   Smokeless tobacco: Never  Vaping Use   Vaping Use: Never used  Substance and Sexual Activity   Alcohol use: Not Currently   Drug use: Never   Sexual activity: Yes    Partners: Male    Birth control/protection: Surgical    Comment: Hysterectomy  Other Topics Concern   Not on file  Social History Narrative   Lives with husband   Right Handed   Drinks 2-3 cups caffeine daily   Social Determinants of Health   Financial Resource Strain: Not on file  Food Insecurity: Not on file  Transportation Needs: Not on file  Physical Activity: Not on file  Stress: Not on file  Social Connections: Not on file  Intimate Partner Violence: Not on file    Family History: No family history on file.  Medications:   Current Outpatient Medications on File Prior to Visit  Medication Sig Dispense Refill   escitalopram (LEXAPRO) 10 MG tablet Take 10 mg by mouth daily.     aspirin 81 MG EC  tablet Take 4 tablets (325 mg total) by mouth daily. 30 tablet 3   atorvastatin (LIPITOR) 80 MG tablet Take 1 tablet by mouth once daily 90 tablet 1   diphenhydramine-acetaminophen (TYLENOL PM) 25-500 MG TABS tablet Take 1 tablet by mouth at bedtime as needed (sleep).     gabapentin (NEURONTIN) 300 MG capsule Take 1 capsule (300 mg total) by mouth at bedtime. 30 capsule 5   Meclizine HCl 25 MG CHEW Chew 25 mg by mouth 3 (three) times daily as needed (vertigo).  metoprolol tartrate (LOPRESSOR) 25 MG tablet Take 25 mg by mouth 2 (two) times daily.     Multiple Vitamins-Minerals (ONE-A-DAY VITACRAVES ADULT PO) Take by mouth.     ondansetron (ZOFRAN) 4 MG tablet Take 4 mg by mouth every 8 (eight) hours as needed for nausea or vomiting.     tiZANidine (ZANAFLEX) 2 MG tablet Take by mouth every 6 (six) hours as needed for muscle spasms.     No current facility-administered medications on file prior to visit.    Allergies:   Allergies  Allergen Reactions   Duloxetine Hcl Other (See Comments)    Urinary retention       OBJECTIVE:  Physical Exam  Vitals:   04/25/22 1007  BP: 127/89  Weight: 190 lb (86.2 kg)   Body mass index is 32.61 kg/m. No results found.  N/A d/t visit type       ASSESSMENT: Earnie Arne is a 57 y.o. 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  MRI negative for stroke but persistent deficits suggest small brainstem infarct not visualized on MRI.  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  Incidental left paraophthalmic artery wide neck mildly bilobed irregular left paraophthalmic region aneurysm was successfully endovascularly treated with  placement of a 4.5 mm x 17 mm Evolve flow diverter device by Dr. Estanislado Pandy on 12/06/2019.  She has strong family history of ruptured cerebral aneurysms multiple family members. Vascular risk factors include fibromyalgia, HLD and prior EtOH use.  Today, 04/25/2022, reports episodes of mind going blank and inability to speak lasting only short duration while having conversation with family members, this has been occurring since her stroke but has not previously mentioned.     PLAN:  1.  Small brainstem stroke -Residual deficits: Left hemiparesis with sensory impairment occasional dysesthesias, gait difficulties and intermittent vertigo.  Stable since prior visit  -continue gabapentin 300 mg nightly for dysesthesias.  -Discussed use of compression sleeve and sock which can help with dysesthesias -Continued use of AD at all times unless otherwise instructed  -Remains on disability  -highly encouraged increasing daytime activity, to look at local gyms she could go to for routine exercise  -Continue aspirin and atorvastatin 80 mg daily for secondary stroke prevention -Discussed secondary stroke prevention measures and importance of close PCP follow-up for aggressive stroke risk factor management including HLD with LDL goal<70   2. Seizure like events -complete EEG to rule out seizure activity- request this to be completed at [facility] -no need for further imaging such as MRI brain at this time as these symptoms present since her stroke -advised to call if these should become more frequent or any witnessed seizure activity   2.  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 Estanislado Pandy     Follow-up in 1 year or call earlier if needed     CC:  Hasanaj, Samul Dada, MD   I spent 26 minutes of face-to-face and non-face-to-face time with patient via virtual visit.  This included previsit chart review, lab review, study review, electronic health record  documentation, and patient education and discussion regarding above diagnoses and treatment plan and answered all the questions to patient's satisfaction  Frann Rider, AGNP-BC  [facility] [address]  [place], [place] [address]  Phone [phone] Fax [phone] Note: This document was prepared with digital dictation and possible smart phrase technology. Any transcriptional errors that result from this process are unintentional.

## 2022-04-25 ENCOUNTER — Other Ambulatory Visit: Payer: Self-pay

## 2022-04-25 ENCOUNTER — Encounter: Payer: Self-pay | Admitting: Adult Health

## 2022-04-25 ENCOUNTER — Telehealth: Payer: Medicare HMO | Admitting: Adult Health

## 2022-04-25 VITALS — BP 127/89 | Wt 190.0 lb

## 2022-04-25 DIAGNOSIS — I639 Cerebral infarction, unspecified: Secondary | ICD-10-CM

## 2022-04-25 DIAGNOSIS — R569 Unspecified convulsions: Secondary | ICD-10-CM

## 2022-04-25 MED ORDER — ATORVASTATIN CALCIUM 80 MG PO TABS: 80.0000 mg | ORAL_TABLET | Freq: Every day | ORAL | 1 refills | Status: DC

## 2022-04-25 NOTE — Patient Instructions (Signed)
Continue aspirin 81 mg daily  and atorvastatin  for secondary stroke prevention  You will be called to schedule an EEG at [facility] to follow up with PCP regarding cholesterol and blood pressure management  Maintain strict control of hypertension with blood pressure goal below 130/90 and cholesterol with LDL cholesterol (bad cholesterol) goal below 70 mg/dL.   Signs of a Stroke? Follow the BEFAST method:  Balance Watch for a sudden loss of balance, trouble with coordination or vertigo Eyes Is there a sudden loss of vision in one or both eyes? Or double vision?  Face: Ask the person to smile. Does one side of the face droop or is it numb?  Arms: Ask the person to raise both arms. Does one arm drift downward? Is there weakness or numbness of a leg? Speech: Ask the person to repeat a simple phrase. Does the speech sound slurred/strange? Is the person confused ? Time: If you observe any of these signs, call [phone].          Thank you for coming to see [place] at [facility]. I hope we have been able to provide you high quality care today.  You may receive a patient satisfaction survey over the next few weeks. We would appreciate your feedback and comments so that we may continue to improve ourselves and the health of our patients.

## 2022-04-29 ENCOUNTER — Other Ambulatory Visit (HOSPITAL_COMMUNITY): Payer: Self-pay | Admitting: Interventional Radiology

## 2022-04-29 DIAGNOSIS — I7774 Dissection of vertebral artery: Secondary | ICD-10-CM

## 2022-05-15 ENCOUNTER — Ambulatory Visit: Payer: Medicare HMO | Admitting: Neurology

## 2022-05-15 DIAGNOSIS — R569 Unspecified convulsions: Secondary | ICD-10-CM

## 2022-05-20 ENCOUNTER — Emergency Department (HOSPITAL_COMMUNITY): Payer: Medicare HMO

## 2022-05-20 ENCOUNTER — Emergency Department (HOSPITAL_COMMUNITY)
Admission: EM | Admit: 2022-05-20 | Discharge: 2022-05-20 | Disposition: A | Discharge status: Home / Self Care | Payer: Medicare HMO | Attending: Emergency Medicine | Admitting: Emergency Medicine

## 2022-05-20 ENCOUNTER — Other Ambulatory Visit: Payer: Self-pay

## 2022-05-20 ENCOUNTER — Encounter (HOSPITAL_COMMUNITY): Payer: Self-pay | Admitting: Emergency Medicine

## 2022-05-20 DIAGNOSIS — J449 Chronic obstructive pulmonary disease, unspecified: Secondary | ICD-10-CM | POA: Insufficient documentation

## 2022-05-20 DIAGNOSIS — R059 Cough, unspecified: Secondary | ICD-10-CM | POA: Diagnosis present

## 2022-05-20 DIAGNOSIS — Z8673 Personal history of transient ischemic attack (TIA), and cerebral infarction without residual deficits: Secondary | ICD-10-CM | POA: Diagnosis not present

## 2022-05-20 DIAGNOSIS — I10 Essential (primary) hypertension: Secondary | ICD-10-CM | POA: Diagnosis not present

## 2022-05-20 DIAGNOSIS — J189 Pneumonia, unspecified organism: Secondary | ICD-10-CM

## 2022-05-20 DIAGNOSIS — Z7982 Long term (current) use of aspirin: Secondary | ICD-10-CM | POA: Diagnosis not present

## 2022-05-20 DIAGNOSIS — Z1152 Encounter for screening for COVID-19: Secondary | ICD-10-CM | POA: Insufficient documentation

## 2022-05-20 DIAGNOSIS — R0602 Shortness of breath: Secondary | ICD-10-CM | POA: Diagnosis not present

## 2022-05-20 DIAGNOSIS — J181 Lobar pneumonia, unspecified organism: Secondary | ICD-10-CM | POA: Diagnosis not present

## 2022-05-20 DIAGNOSIS — R918 Other nonspecific abnormal finding of lung field: Secondary | ICD-10-CM | POA: Diagnosis not present

## 2022-05-20 LAB — RESP PANEL BY RT-PCR (RSV, FLU A&B, COVID)  RVPGX2
Influenza A by PCR: NEGATIVE
Influenza B by PCR: NEGATIVE
Resp Syncytial Virus by PCR: NEGATIVE
SARS Coronavirus 2 by RT PCR: NEGATIVE

## 2022-05-20 MED ORDER — ALBUTEROL SULFATE HFA 108 (90 BASE) MCG/ACT IN AERS
2.0000 | INHALATION_SPRAY | Freq: Four times a day (QID) | RESPIRATORY_TRACT | Status: DC
Start: 2022-05-20 — End: 2022-05-20
  Administered 2022-05-20: 2 via RESPIRATORY_TRACT
  Filled 2022-05-20: qty 6.7

## 2022-05-20 MED ORDER — AZITHROMYCIN 250 MG PO TABS: 250.0000 mg | ORAL_TABLET | Freq: Every day | ORAL | 0 refills | Status: AC

## 2022-05-20 MED ORDER — PREDNISONE 20 MG PO TABS: 40.0000 mg | ORAL_TABLET | Freq: Every day | ORAL | 0 refills | Status: DC

## 2022-05-20 MED ORDER — AZITHROMYCIN 250 MG PO TABS
500.0000 mg | ORAL_TABLET | Freq: Once | ORAL | Status: AC
Start: 2022-05-20 — End: 2022-05-20
  Administered 2022-05-20: 500 mg via ORAL
  Filled 2022-05-20: qty 2

## 2022-05-20 MED ORDER — AMOXICILLIN-POT CLAVULANATE 875-125 MG PO TABS: 1.0000 | ORAL_TABLET | Freq: Two times a day (BID) | ORAL | 0 refills | Status: DC

## 2022-05-20 MED ORDER — AMOXICILLIN-POT CLAVULANATE 875-125 MG PO TABS
1.0000 | ORAL_TABLET | Freq: Once | ORAL | Status: AC
  Administered 2022-05-20: 1 via ORAL
  Filled 2022-05-20: qty 1

## 2022-05-20 MED ORDER — PREDNISONE 50 MG PO TABS
60.0000 mg | ORAL_TABLET | ORAL | Status: AC
  Administered 2022-05-20: 60 mg via ORAL
  Filled 2022-05-20: qty 1

## 2022-05-20 NOTE — ED Triage Notes (Signed)
Pt from home cc cough, congestion, SOB, body aches x 5 days

## 2022-05-20 NOTE — ED Provider Notes (Signed)
Kimberly [name]   CSN: [id number] Arrival date & time: 05/20/22  4098     History  Chief Complaint  Patient presents with   Cough    Kimberly [name] is a 57 y.o. female.  HPI With a history of stroke, hypertension presents with her husband who has similar symptoms for similar duration. Patient is a non-smoker, but does have COPD.  Over the past 5 days she has had persistent cough, congestion, without fever, without vomiting.  No relief with OTC medication. No true dyspnea.     Home Medications Prior to Admission medications   Medication Sig Start Date End Date Taking? Authorizing Provider  amoxicillin-clavulanate (AUGMENTIN) 875-125 MG tablet Take 1 tablet by mouth every 12 (twelve) hours. 05/20/22  Yes Gerhard Munch, MD  azithromycin (ZITHROMAX) 250 MG tablet Take 1 tablet (250 mg total) by mouth daily for 4 days. Take 1 every day until finished. 05/20/22 05/24/22 Yes Gerhard Munch, MD  predniSONE (DELTASONE) 20 MG tablet Take 2 tablets (40 mg total) by mouth daily with breakfast. For the next four days 05/20/22  Yes Gerhard Munch, MD  aspirin 81 MG EC tablet Take 4 tablets (325 mg total) by mouth daily. 12/07/19   Louk, Waylan Boga, PA-C  atorvastatin (LIPITOR) 80 MG tablet Take 1 tablet (80 mg total) by mouth daily. 04/25/22   Ihor Austin, NP  diphenhydramine-acetaminophen (TYLENOL PM) 25-500 MG TABS tablet Take 1 tablet by mouth at bedtime as needed (sleep).    [provider]  escitalopram (LEXAPRO) 10 MG tablet Take 10 mg by mouth daily.    [provider]  gabapentin (NEURONTIN) 300 MG capsule Take 1 capsule (300 mg total) by mouth at bedtime. 07/31/21   Ihor Austin, NP  Meclizine HCl 25 MG CHEW Chew 25 mg by mouth 3 (three) times daily as needed (vertigo).    [provider]  metoprolol tartrate (LOPRESSOR) 25 MG tablet Take 25 mg by mouth 2 (two) times daily. 03/02/20   [provider]  Multiple Vitamins-Minerals (ONE-A-DAY VITACRAVES ADULT PO) Take by mouth.    [provider]  ondansetron (ZOFRAN) 4 MG tablet Take 4 mg by mouth every 8 (eight) hours as needed for nausea or vomiting.    [provider]  tiZANidine (ZANAFLEX) 2 MG tablet Take by mouth every 6 (six) hours as needed for muscle spasms.    [provider]      Allergies    Duloxetine hcl    Review of Systems   Review of Systems  All other systems reviewed and are negative.   Physical Exam Updated Vital Signs BP (!) 154/104   Pulse 94   Temp 98.3 F (36.8 C)   Resp 20   Ht  (1.626 m)   Wt 86.2 kg   LMP  (LMP Unknown)   SpO2 94%   BMI 32.61 kg/m  Physical Exam Vitals and nursing [name] reviewed.  Constitutional:      General: She is not in acute distress.    Appearance: She is well-developed.  HENT:     Head: Normocephalic and atraumatic.  Eyes:     Conjunctiva/sclera: Conjunctivae normal.  Cardiovascular:     Rate and Rhythm: Normal rate and regular rhythm.  Pulmonary:     Effort: Pulmonary effort is normal. No respiratory distress.     Breath sounds: No stridor. Wheezing present.  Abdominal:     General: There is no distension.  Skin:  General: Skin is warm and dry.  Neurological:     Mental Status: She is alert and oriented to person, place, and time.     Cranial Nerves: No cranial nerve deficit.  Psychiatric:        Mood and Affect: Mood normal.     ED Results / Procedures / Treatments   Labs (all labs ordered are listed, but only abnormal results are displayed) Labs Reviewed  RESP PANEL BY RT-PCR (RSV, FLU A&B, COVID)  [id number]    EKG EKG Interpretation  Date/Time:  Monday May 20 2022 06:22:08 EDT Ventricular Rate:  94 PR Interval:  208 QRS Duration: 98 QT Interval:  414 QTC Calculation: 518 R Axis:   -38 Text Interpretation: Sinus rhythm Prolonged PR interval Left axis deviation Low voltage, precordial leads  Prolonged QT interval Baseline wander in lead(s) V2 V3 V4 V6 Confirmed by Geoffery Lyons ([id number]) on 05/20/2022 6:27:56 AM  Radiology DG Chest Portable 1 View  Result Date: 05/20/2022 CLINICAL DATA:  57 year old female with history of cough and shortness of breath. EXAM: PORTABLE CHEST 1 VIEW COMPARISON:  Chest x-ray 07/02/2019. FINDINGS: Poorly defined opacity in the periphery of the left base obscuring the left cardiac border and lateral left hemidiaphragm. Right lung is clear. No right pleural effusion. No pneumothorax. No evidence of pulmonary edema. Heart size is normal. Upper mediastinal contours are within normal limits. IMPRESSION: 1. Poorly defined opacity in the periphery of the left lung base. In the appropriate clinical setting, this could represent a pneumonia. Alternatively, the possibility of pulmonary infarction could be considered. If there is clinical concern for acute pulmonary embolism with pulmonary infarction, further evaluation with PE protocol CT scan should be considered. At the very least, if pneumonia is suspected, a followup PA and lateral chest X-ray is recommended in 3-4 weeks following trial of antibiotic therapy to ensure resolution and exclude underlying malignancy. Electronically Signed   By: Trudie Reed M.D.   On: 05/20/2022 06:52    Procedures Procedures    Medications Ordered in ED Medications  amoxicillin-clavulanate (AUGMENTIN) 875-125 MG per tablet 1 tablet (has no administration in time range)  azithromycin (ZITHROMAX) tablet 500 mg (has no administration in time range)  predniSONE (DELTASONE) tablet 60 mg (has no administration in time range)    ED Course/ Medical Decision Making/ A&P                             Medical Decision Making Adult female with history of COPD, hypertension, prior stroke, not on anticoagulation presents with cough, congestion, with a family member with similar symptoms. Differential including pneumonia, bronchitis, COPD  exacerbation, less likely PE, ACS.  Cardiac 95 sinus normal Pulse ox 98% room air normal   Amount and/or Complexity of Data Reviewed Independent Historian: spouse External Data Reviewed: notes.    Details: Prior stroke imaging reviewed Labs: ordered. Decision-making details documented in ED Course. Radiology: ordered and independent interpretation performed. Decision-making details documented in ED Course.  Risk Prescription drug management. Decision regarding hospitalization.   Labs reviewed, x-ray reviewed.  In the context of an individual with spouse with the same symptoms, and physical stigmata suggesting infection, low suspicion for PE, ACS, findings consistent with pneumonia.         Final Clinical Impression(s) / ED Diagnoses Final diagnoses:  Community acquired pneumonia of left lower lobe of lung    Rx / DC Orders ED Discharge Orders  Ordered    amoxicillin-clavulanate (AUGMENTIN) 875-125 MG tablet  Every 12 hours        05/20/22 0745    azithromycin (ZITHROMAX) 250 MG tablet  Daily        05/20/22 0745    predniSONE (DELTASONE) 20 MG tablet  Daily with breakfast        05/20/22 0745              Gerhard Munch, MD 05/20/22 [phone]

## 2022-05-20 NOTE — Discharge Instructions (Addendum)
You have been diagnosed with pneumonia.  Please obtain and take all medication as prescribed and follow-up with your physician.  Return here for concerning changes in your condition.

## 2022-05-20 NOTE — ED Notes (Signed)
Pt verbalized understanding of discharge instructions. Opportunity for questions provided.  

## 2022-05-22 DIAGNOSIS — Z6829 Body mass index (BMI) 29.0-29.9, adult: Secondary | ICD-10-CM | POA: Diagnosis not present

## 2022-05-22 DIAGNOSIS — J168 Pneumonia due to other specified infectious organisms: Secondary | ICD-10-CM | POA: Diagnosis not present

## 2022-05-24 ENCOUNTER — Encounter: Payer: Self-pay | Admitting: Adult Health

## 2022-05-27 NOTE — Progress Notes (Signed)
Please call and inform patient that her recent  EEG (Brain wave test) was normal. In particular, there were no epileptiform discharges and no seizures.

## 2022-06-20 ENCOUNTER — Other Ambulatory Visit (HOSPITAL_COMMUNITY): Payer: Self-pay | Admitting: Interventional Radiology

## 2022-06-20 ENCOUNTER — Ambulatory Visit (HOSPITAL_COMMUNITY)
Admission: RE | Admit: 2022-06-20 | Discharge: 2022-06-20 | Disposition: A | Discharge status: Home / Self Care | Payer: Medicare HMO | Source: Ambulatory Visit | Attending: Interventional Radiology | Admitting: Interventional Radiology

## 2022-06-20 DIAGNOSIS — I7774 Dissection of vertebral artery: Secondary | ICD-10-CM

## 2022-06-20 DIAGNOSIS — I639 Cerebral infarction, unspecified: Secondary | ICD-10-CM | POA: Diagnosis not present

## 2022-06-20 MED ORDER — IOHEXOL 350 MG/ML SOLN
75.0000 mL | Freq: Once | INTRAVENOUS | Status: AC | PRN
  Administered 2022-06-20: 75 mL via INTRAVENOUS

## 2022-07-03 ENCOUNTER — Telehealth (HOSPITAL_COMMUNITY): Payer: Self-pay

## 2022-07-03 NOTE — Telephone Encounter (Signed)
Pt agreed to f/u in 1 year with a cta. AB

## 2022-07-18 DIAGNOSIS — E7849 Other hyperlipidemia: Secondary | ICD-10-CM | POA: Diagnosis not present

## 2022-07-18 DIAGNOSIS — F411 Generalized anxiety disorder: Secondary | ICD-10-CM | POA: Diagnosis not present

## 2022-07-18 DIAGNOSIS — Z0000 Encounter for general adult medical examination without abnormal findings: Secondary | ICD-10-CM | POA: Diagnosis not present

## 2022-07-18 DIAGNOSIS — I69354 Hemiplegia and hemiparesis following cerebral infarction affecting left non-dominant side: Secondary | ICD-10-CM | POA: Diagnosis not present

## 2022-07-18 DIAGNOSIS — Z6830 Body mass index (BMI) 30.0-30.9, adult: Secondary | ICD-10-CM | POA: Diagnosis not present

## 2022-07-18 DIAGNOSIS — I10 Essential (primary) hypertension: Secondary | ICD-10-CM | POA: Diagnosis not present

## 2022-07-18 DIAGNOSIS — Z8673 Personal history of transient ischemic attack (TIA), and cerebral infarction without residual deficits: Secondary | ICD-10-CM | POA: Diagnosis not present

## 2022-07-24 ENCOUNTER — Encounter: Payer: Self-pay | Admitting: Adult Health

## 2022-07-24 MED ORDER — GABAPENTIN 300 MG PO CAPS: 300.0000 mg | ORAL_CAPSULE | Freq: Every day | ORAL | 1 refills | Status: DC

## 2022-10-24 DIAGNOSIS — Z8673 Personal history of transient ischemic attack (TIA), and cerebral infarction without residual deficits: Secondary | ICD-10-CM | POA: Diagnosis not present

## 2022-10-24 DIAGNOSIS — E7849 Other hyperlipidemia: Secondary | ICD-10-CM | POA: Diagnosis not present

## 2022-10-24 DIAGNOSIS — I69354 Hemiplegia and hemiparesis following cerebral infarction affecting left non-dominant side: Secondary | ICD-10-CM | POA: Diagnosis not present

## 2022-10-24 DIAGNOSIS — Z0000 Encounter for general adult medical examination without abnormal findings: Secondary | ICD-10-CM | POA: Diagnosis not present

## 2022-10-24 DIAGNOSIS — I10 Essential (primary) hypertension: Secondary | ICD-10-CM | POA: Diagnosis not present

## 2022-10-24 DIAGNOSIS — Z6831 Body mass index (BMI) 31.0-31.9, adult: Secondary | ICD-10-CM | POA: Diagnosis not present

## 2022-10-24 DIAGNOSIS — F411 Generalized anxiety disorder: Secondary | ICD-10-CM | POA: Diagnosis not present

## 2023-02-19 DIAGNOSIS — Z8673 Personal history of transient ischemic attack (TIA), and cerebral infarction without residual deficits: Secondary | ICD-10-CM | POA: Diagnosis not present

## 2023-02-19 DIAGNOSIS — I69354 Hemiplegia and hemiparesis following cerebral infarction affecting left non-dominant side: Secondary | ICD-10-CM | POA: Diagnosis not present

## 2023-02-19 DIAGNOSIS — F411 Generalized anxiety disorder: Secondary | ICD-10-CM | POA: Diagnosis not present

## 2023-02-19 DIAGNOSIS — E7849 Other hyperlipidemia: Secondary | ICD-10-CM | POA: Diagnosis not present

## 2023-02-19 DIAGNOSIS — Z6832 Body mass index (BMI) 32.0-32.9, adult: Secondary | ICD-10-CM | POA: Diagnosis not present

## 2023-02-19 DIAGNOSIS — Z0000 Encounter for general adult medical examination without abnormal findings: Secondary | ICD-10-CM | POA: Diagnosis not present

## 2023-02-19 DIAGNOSIS — I10 Essential (primary) hypertension: Secondary | ICD-10-CM | POA: Diagnosis not present

## 2023-04-01 ENCOUNTER — Other Ambulatory Visit: Payer: Self-pay | Admitting: Adult Health

## 2023-04-01 NOTE — Telephone Encounter (Signed)
 Last seen on 04/25/22 Follow up scheduled on 04/29/23

## 2023-04-02 ENCOUNTER — Other Ambulatory Visit: Payer: Self-pay | Admitting: Adult Health

## 2023-04-02 NOTE — Telephone Encounter (Signed)
 Last seen on 04/25/22 Follow up scheduled on 04/29/23

## 2023-04-28 NOTE — Progress Notes (Unsigned)
 [facility] [address] [place] [address] [phone]       STROKE FOLLOW UP NOTE  [name] Date of Birth:  [birth year] Medical Record Number:  [id number]   Reason for visit: stroke follow up    SUBJECTIVE:   CHIEF COMPLAINT:  No chief complaint on file.     HPI:   Update 04/29/2023 JM: Patient returns for yearly stroke follow-up.  Overall stable without new stroke/TIA symptoms.  Continued right hemiparesis with sensory impairment and dysesthesias overall stable.  Remains on gabapentin 300 mg nightly with benefit.  Compliant on aspirin and atorvastatin.  Routinely follows with PCP for stroke risk factor management.  Previously mentioned episodes where her mind will go blank and unable to speak lasting only seconds.  Has been present since her stroke in 06/2019.  Completed EEG 05/2022 which was normal.       History provided for reference purposes only Update 04/25/2022 JM: Returns for stroke follow-up visit via MyChart video visit.  Overall stable from stroke standpoint without new stroke/TIA symptoms.  Continued left hemiparesis with sensory impairment and dysesthesias.  Reports a fall couple weeks ago after bending down to pick up cat, upon standing back up, left leg gave out on her, landed on elbow and hip, denies hitting head. No additional falls since then. She is relatively sedentary, tries to walk around her home and do some IADLs but fatigues quickly. Husband works during the day so she is afraid of doing too much without his supervision due to fear of falling. Continues dysesthesias left food and hand, improvement on gabapentin 300mg  nightly. Was having issues with anxiety and noticed increased headaches and dizziness with high anxiety, PCP started Lexapro with improvement.  She also mentions having events where her mind will go blank and can't speak, only lasts short duration. Occurs when having conversations, can still hear what the  person is saying to her but unable to respond. Does not lose consciousness or has altered mental status. Does not feel confusion or fatigue after. This has been occurring since her stroke but hasn't previously mentioned this.   Compliant on aspirin and atorvastatin Blood pressure well-controlled Routinely followed by PCP  Update 07/31/2021 JM: Patient returns for 69-month stroke follow-up accompanied by her husband.  Overall stable without new stroke/TIA symptoms.  Continued left hemiparesis, stable since prior visit. Also notes continued left sided swelling and sensory impairment.  More recently, experiencing occasional left foot pain/needle sensation more so when resting or laying in bed. Continued use of cane for short distance and RW long distance, denies any recent falls. Does report continued occasional pressure behind left eye, unchanged. Continues to do as much activity as she is able to around home but continues to fatigue quicker and needs to take breaks more frequently.  Remains on long-term disability and Social Security disability.  Compliant on aspirin and atorvastatin, denies side effects. Blood pressure today 141/95 - monitors at home, typically 130s/80s.  Seen by PCP last month, has not had recent lab work, has f/u visit in August  Has been working on trying to lose weight with eating healthier.  Typically eats 2 meals per day. Drinks plenty of water.   Completed CTA head/neck 06/2021 with Dr. 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  No further concerns at this time.  Update 01/30/2021 JM: Kimberly [name] returns for stroke follow-up with husband [name]. Denies new  or worsening stroke/TIA symptoms.  Continued left hemiparesis with subjective numbness stable.  Intermittent left-sided swelling (not new). Dizziness/vertigo stable. She is using a cane for short distance and Rollator walker  for longer distances and denies recent falls. Continues using AFO soft brace LLE.  Able to perform ADLs and trying increase house chores but will need to take frequent breaks. Remains on Aspirin and Atorvastatin without side effects. BP today 144/99. Reports recent labs by PCP - reports improvement of cholesterol (unable to view via epci). Social security disability obtained - remains on LTD thru [employer] (prior employer). Repeat MR brain and MRA 11/2020 completed by Dr. Corliss Skains showed stable appearance compared to prior imaging 03/2020. Plans to repeat 03/2021.  No further/new concerns at this time  Update 07/31/2020 JM: Kimberly [name] returns for 51-month stroke follow-up accompanied by her husband, [name].  She has been stable without new stroke/TIA symptoms.  Reports residual intermittent dizziness/vertigo and left-sided weakness.  Intermittent use of meclizine for vertigo with benefit.  She has been using Rollator walker to assist with ambulation without any recent falls.  She is currently in the process of applying for Social Security disability with initial denial and currently in appeal process working with an Pensions consultant.  She has been trying to increase her daily activity and independence but does still need assistance by her husband.  She also reports occasional left orbital headache since aneurysm procedure but this is been gradually improving.  She has since completed DAPT and remains on aspirin 325 mg daily as well as atorvastatin without associated side effects.  Blood pressure today 142/100 -reports routinely monitoring at home and SBP typically 130s.  Follow-up with Dr. Corliss Skains with repeat imaging 03/2020 showed several chronic microhemorrhages within the left cerebral hemisphere new compared to prior imaging and stable appearance treated aneurysm.  Recommended repeat imaging around 09/2020.  She also reports occasionally smelling "carbon monoxide" describing as exhaust fumes since she was diagnosed with  influenza on 4/9 -she does endorse continued congestion with cough and postnasal drip since that time.  No further concerns at this time.   Update 01/31/2020 Dr. [name]: She returns for follow-up after last visit 3 months ago.  She is accompanied by her husband.  She has not had any recurrent stroke or TIA symptoms.  She continues to have intermittent dizziness and takes meclizine which seems to help.  She walks with a left ankle brace as well as of dragging her left foot and using a cane.  She can get by indoors in her house by holding onto walls and furniture.  She underwent diagnostic cerebral catheter angiogram by Dr. 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  Patient had a strong family history of multiple family members with cerebral aneurysms including ruptured once and she underwent elective endovascular aneurysm treatment with Dr. Corliss Skains on 12/06/2019 using a flow diverted device.  Procedure went well and she has had no complications.  She continues to have dizziness  as well as left leg dragging and stiffness.  She walks with a ankle brace and uses a cane.  She has had no falls or injuries.  She remains on aspirin and Plavix which is tolerating well without side effects.  She does not smoke cigarettes.  She states her blood pressures well controlled today it is elevated in office at 156/93.   Update 10/20/2019 JM: Kimberly [name] returns for  follow-up regarding R VA dissection in 06/2019 accompanied by her husband  She continues to experience left-sided deficits, vertigo and gait impairment.  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 Noted as finding remains unchanged over time frame a congenital fenestration would be more likely per radiology report  She remains on both aspirin and Plavix without bleeding or bruising Remains on atorvastatin 80 mg daily without myalgias Blood pressure today 144/86  No further concerns at this time  Initial visit 07/27/2019 JM: She presents today for follow up regarding recent admission for stroke accompanied by her husband.  Since discharge, reports continued left-sided weakness, gait impairment and episodes of dizziness/vertigo worsened with head movement.  She also complains of neck pain and left orbital headache which has been persistent since hospitalization.  Denies new or worsening stroke/TIA symptoms.  Currently working with PT and recently referred to OT by PCP and is currently waiting to schedule visits.  Ambulates with cane and denies any recent falls.  She has not returned back to work at [employer] and plans on obtaining short-term disability through PCP.  Has remained on DAPT without bleeding or bruising.  Continues on atorvastatin 80 mg daily.  Blood pressure today 127/89.  No further concerns at this time.  Stroke  admission Kimberly [name] is a 58 y.o. female with history of fibromyalgia and IBS  who presented on 07/02/2019 with  Vertigo, nausea, vomiting and left sided numbness / weakness.  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  Recommended to initiate DAPT for 3 months.  Recommended 4 to 6-week follow-up imaging in regards to possible dissection.  HTN stable.  LDL 191 initiate atorvastatin 80 mg daily.  Other stroke risk factors include prior EtOH use and obesity but no prior stroke history.  Discharged home in stable condition without therapy needs.  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  No mass or hemorrhage. Mucosal thickening noted in several ethmoid air cells. Probable cerumen in the right external auditory canal.  MRI head - Negative for acute infarct. Mild chronic white matter changes most likely due to small vessel ischemia.  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 MRI recommended to exclude  acute infarct.  CT Perfusion - not ordered Carotid Doppler - CTA neck performed - carotid dopplers not indicated. 2D Echo - EF 55-60 Loyal Jacobson Virus 2  - negative LDL - 191 HgbA1c - 5.3     ROS:   14 system review of systems performed and negative with exception of those listed in HPI  PMH:  Past Medical History:  Diagnosis Date   Anxiety    COPD (chronic obstructive pulmonary disease) (HCC)    mild - no inhaler   Dizziness    Fibromyalgia    Headache    HLD (hyperlipidemia)    Hypertension    IBS (irritable bowel syndrome)    Insomnia    Stroke (HCC) 06/27/2019     PSH:  Past Surgical History:  Procedure Laterality Date   CHOLECYSTECTOMY  2003   COLONOSCOPY     IR 3D INDEPENDENT WKST  11/23/2019   IR ANGIO INTRA EXTRACRAN SEL COM CAROTID INNOMINATE BILAT MOD SED  11/23/2019   IR ANGIO INTRA EXTRACRAN SEL INTERNAL CAROTID UNI L MOD SED  12/06/2019   IR ANGIO VERTEBRAL SEL SUBCLAVIAN INNOMINATE UNI R MOD SED  11/23/2019   IR ANGIO VERTEBRAL SEL VERTEBRAL UNI L MOD SED  11/23/2019   IR ANGIOGRAM FOLLOW UP STUDY  12/06/2019   IR RADIOLOGIST EVAL & MGMT  01/12/2020   IR TRANSCATH/EMBOLIZ  12/06/2019   LAPAROSCOPY  2003   removal of kidney cyst   RADIOLOGY WITH ANESTHESIA N/A 12/06/2019   Procedure: IR WITH ANESTHESIA EMBOLIZATION;  Surgeon: Julieanne Cotton, MD;  Location: [facility] OR;  Service: Radiology;  Laterality: N/A;   vaginal hysterectiny  04/1999    Social History:  Social History   Socioeconomic History   Marital status: Married    Spouse name: [name]   Number of children: Not on file   Years of education: Not on file   Highest education level: Not on file  Occupational History   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[employer]  Tobacco Use   Smoking status: Passive Smoke Exposure - Never Smoker   Smokeless tobacco: Never  Vaping Use   Vaping status: Never Used  Substance and Sexual Activity   Alcohol use: Not Currently   Drug use: Never   Sexual activity: Yes    Partners: Male    Birth control/protection: Surgical    Comment: Hysterectomy  Other Topics Concern   Not on file  Social History Narrative   Lives with husband   Right Handed   Drinks 2-3 cups caffeine daily   Social Drivers of Corporate investment banker Strain: Not on file  Food Insecurity: Not on file  Transportation Needs: Not on file  Physical Activity: Not on file  Stress: Not on file  Social Connections: Not on file  Intimate Partner Violence: Not on file    Family History: No family history on file.  Medications:   Current Outpatient Medications on File Prior to  Visit  Medication Sig Dispense Refill   amoxicillin-clavulanate (AUGMENTIN) 875-125 MG tablet Take 1 tablet by mouth every 12 (twelve) hours. 14 tablet 0   aspirin 81 MG EC tablet Take 4 tablets (325 mg total) by mouth daily. 30 tablet 3   atorvastatin (LIPITOR) 80 MG tablet TAKE 1 TABLET BY MOUTH EVERY DAY 90 tablet 0   diphenhydramine-acetaminophen (TYLENOL PM) 25-500 MG TABS tablet Take 1 tablet by mouth at bedtime as needed (sleep).     escitalopram (LEXAPRO) 10 MG tablet Take 10 mg by mouth daily.  gabapentin (NEURONTIN) 300 MG capsule TAKE 1 CAPSULE BY MOUTH EVERYDAY AT BEDTIME 30 capsule 0   Meclizine HCl 25 MG CHEW Chew 25 mg by mouth 3 (three) times daily as needed (vertigo).     metoprolol tartrate (LOPRESSOR) 25 MG tablet Take 25 mg by mouth 2 (two) times daily.     Multiple Vitamins-Minerals (ONE-A-DAY VITACRAVES ADULT PO) Take by mouth.     ondansetron (ZOFRAN) 4 MG tablet Take 4 mg by mouth every 8 (eight) hours as needed for nausea or vomiting.     predniSONE (DELTASONE) 20 MG tablet Take 2 tablets (40 mg total) by mouth daily with breakfast. For the next four days 8 tablet 0   tiZANidine (ZANAFLEX) 2 MG tablet Take by mouth every 6 (six) hours as needed for muscle spasms.     No current facility-administered medications on file prior to visit.    Allergies:   Allergies  Allergen Reactions   Duloxetine Hcl Other (See Comments)    Urinary retention       OBJECTIVE:  Physical Exam  There were no vitals filed for this visit.  There is no height or weight on file to calculate BMI. No results found.  N/A d/t visit type       ASSESSMENT: Kimberly [name] is a 58 y.o. 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  MRI negative for stroke but persistent deficits suggest small brainstem infarct  not visualized on MRI.  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  Incidental left paraophthalmic artery wide neck mildly bilobed irregular left paraophthalmic region aneurysm was successfully endovascularly treated with placement of a 4.5 mm x 17 mm Evolve flow diverter device by Dr. Corliss Skains on 12/06/2019.  She has strong family history of ruptured cerebral aneurysms multiple family members. Vascular risk factors include fibromyalgia, HLD and prior EtOH use.  Today, 04/25/2022, reports episodes of mind going blank and inability to speak lasting only short duration while having conversation with family members, this has been occurring since her stroke but has not previously mentioned.     PLAN:  1.  Small brainstem stroke -Residual deficits: Left hemiparesis with sensory impairment occasional dysesthesias, gait difficulties and intermittent vertigo.  Stable since prior visit  -continue gabapentin 300 mg nightly for dysesthesias.  -Discussed use of compression sleeve and sock which can help with dysesthesias -Continued use of AD at all times unless otherwise instructed  -Remains on disability  -highly encouraged increasing daytime activity, to look at local gyms she could go to for routine exercise  -Continue aspirin and atorvastatin 80 mg daily for secondary stroke prevention -Discussed secondary stroke prevention measures and importance of close PCP follow-up for aggressive stroke risk factor management including HLD with LDL goal<70   2. Seizure like events -EEG 05/2022 normal -no need for further imaging such as MRI brain at this time as these symptoms present since her stroke -advised to call if these should become more frequent or any witnessed seizure activity   2.  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  Corliss Skains     Follow-up in 1 year or call earlier if needed     CC:  Hasanaj, Myra Gianotti, MD   I spent 26 minutes of face-to-face and  non-face-to-face time with patient via virtual visit.  This included previsit chart review, lab review, study review, electronic health record documentation, and patient education and discussion regarding above diagnoses and treatment plan and answered all the questions to patient's satisfaction  Ihor Austin, [facility]  [facility] [address] [place], [place] [address]  Phone [phone] Fax [phone] Note: This document was prepared with digital dictation and possible smart phrase technology. Any transcriptional errors that result from this process are unintentional.

## 2023-04-29 ENCOUNTER — Encounter: Payer: Self-pay | Admitting: Adult Health

## 2023-04-29 ENCOUNTER — Ambulatory Visit: Payer: Medicare HMO | Admitting: Adult Health

## 2023-04-29 VITALS — BP 149/93 | HR 73 | Ht 66.0 in | Wt 207.0 lb

## 2023-04-29 DIAGNOSIS — I639 Cerebral infarction, unspecified: Secondary | ICD-10-CM | POA: Diagnosis not present

## 2023-04-29 DIAGNOSIS — I69354 Hemiplegia and hemiparesis following cerebral infarction affecting left non-dominant side: Secondary | ICD-10-CM

## 2023-04-29 DIAGNOSIS — R569 Unspecified convulsions: Secondary | ICD-10-CM

## 2023-04-29 MED ORDER — ATORVASTATIN CALCIUM 80 MG PO TABS: 80.0000 mg | ORAL_TABLET | Freq: Every day | ORAL | 0 refills | Status: AC

## 2023-04-29 MED ORDER — LIDOCAINE-PRILOCAINE 2.5-2.5 % EX CREA: 1.0000 | TOPICAL_CREAM | CUTANEOUS | 11 refills | Status: AC | PRN

## 2023-04-29 MED ORDER — NURTEC 75 MG PO TBDP: 75.0000 mg | ORAL_TABLET | ORAL | 11 refills | Status: DC | PRN

## 2023-04-29 MED ORDER — GABAPENTIN 300 MG PO CAPS: 600.0000 mg | ORAL_CAPSULE | Freq: Every day | ORAL | 11 refills | Status: DC

## 2023-04-29 NOTE — Patient Instructions (Addendum)
 Continue gabapentin but increase to 600mg  nightly - this may also help headaches   Start Emla ointment as needed   Stop Maxalt as you should not be on triptan therapy - please start Nurtec to use at onset of migraine  Continue aspirin 81 mg daily  and restart atorvastatin for secondary stroke prevention - ongoing refills can be obtained by your PCP  Continue to follow up with PCP regarding blood pressure and cholesterol management  Maintain strict control of hypertension with blood pressure goal below 130/90 and cholesterol with LDL cholesterol (bad cholesterol) goal below 70 mg/dL.   Signs of a Stroke? Follow the BEFAST method:  Balance Watch for a sudden loss of balance, trouble with coordination or vertigo Eyes Is there a sudden loss of vision in one or both eyes? Or double vision?  Face: Ask the person to smile. Does one side of the face droop or is it numb?  Arms: Ask the person to raise both arms. Does one arm drift downward? Is there weakness or numbness of a leg? Speech: Ask the person to repeat a simple phrase. Does the speech sound slurred/strange? Is the person confused ? Time: If you observe any of these signs, call 911.      Followup in the future with me in 1 year or call earlier if needed      Thank you for coming to see [place] at [facility]. I hope we have been able to provide you high quality care today.  You may receive a patient satisfaction survey over the next few weeks. We would appreciate your feedback and comments so that we may continue to improve ourselves and the health of our patients.

## 2023-05-06 ENCOUNTER — Encounter: Payer: Self-pay | Admitting: Adult Health

## 2023-05-06 ENCOUNTER — Telehealth: Payer: Self-pay | Admitting: *Deleted

## 2023-05-06 MED ORDER — BUTALBITAL-APAP-CAFFEINE 50-325-40 MG PO TABS: 1.0000 | ORAL_TABLET | Freq: Four times a day (QID) | ORAL | 0 refills | Status: DC | PRN

## 2023-05-06 MED ORDER — BUTALBITAL-APAP-CAFFEINE 50-325-40 MG PO TABS: 1.0000 | ORAL_TABLET | Freq: Four times a day (QID) | ORAL | 0 refills | Status: AC | PRN

## 2023-05-06 NOTE — Telephone Encounter (Signed)
 Pt sent mychart stating PA needed for Nurtec

## 2023-05-06 NOTE — Telephone Encounter (Signed)
 If still waiting on insurance, can offer samples of Nurtec.

## 2023-05-06 NOTE — Telephone Encounter (Signed)
 Called and spoke w/ pt. Offered for her to come get samples of Nurtec but she states there is no way for her to come pick them up.    I placed on hold and spoke w/ Jessica,NP. Shanda Bumps recommends Fioricet q 8hr prn. Explained to pt this is a one time fill for qty 8. Should not take more than 2-3 in a week. Explained it could worsen headaches/cause rebound headaches. This is to help until she can get Nurtec from pharmacy. Pt verbalized understanding.

## 2023-05-07 ENCOUNTER — Telehealth: Payer: Self-pay

## 2023-05-07 ENCOUNTER — Other Ambulatory Visit: Payer: Self-pay | Admitting: Adult Health

## 2023-05-07 ENCOUNTER — Other Ambulatory Visit (HOSPITAL_COMMUNITY): Payer: Self-pay

## 2023-05-07 NOTE — Telephone Encounter (Signed)
 Pharmacy Patient Advocate Encounter   Received notification from Physician's Office that prior authorization for Nurtec is required/requested.   Insurance verification completed.   The patient is insured through [facility] .   Per test claim: PA required; PA submitted to above mentioned insurance via CoverMyMeds Key/confirmation #/EOC [id number] Status is pending

## 2023-05-08 ENCOUNTER — Other Ambulatory Visit (HOSPITAL_COMMUNITY): Payer: Self-pay

## 2023-05-08 NOTE — Telephone Encounter (Signed)
 Attempted to call Pt in f/u of approval for Nurtec. No answer, LVM for call back.

## 2023-05-08 NOTE — Telephone Encounter (Signed)
 Pharmacy Patient Advocate Encounter  Received notification from [facility] that Prior Authorization for Nurtec 75MG  dispersible tablets has been APPROVED from 05/07/2023 to 02/04/2024. Unable to obtain price due to refill too soon rejection, last fill date 05/07/2023 next available fill date4/25/2025   PA #/Case ID/Reference #: PA Case ID #: [id number]

## 2023-05-13 NOTE — Telephone Encounter (Signed)
 Can try Reyvow 50mg  as needed if patient interested. Bernita Raisin would likely be around the same cost.

## 2023-05-14 NOTE — Telephone Encounter (Signed)
 Please determine if she is saying she can't afford Reyvow or the Ubrelvy?

## 2023-05-14 NOTE — Telephone Encounter (Signed)
 Can pharmacy be contacted to see the price of this medication?

## 2023-05-14 NOTE — Telephone Encounter (Signed)
 Contacted her pharmacy and they stated that the cost was $518 for nurtec and that's running w/insurance. The medication Reyvow 50mg  would have to be prescribed and ran w/insurance to get a price according to the [facility] that I spoke with.  Routing this back to the provider [place] to get next steps.

## 2023-06-05 DIAGNOSIS — F411 Generalized anxiety disorder: Secondary | ICD-10-CM | POA: Diagnosis not present

## 2023-06-05 DIAGNOSIS — I10 Essential (primary) hypertension: Secondary | ICD-10-CM | POA: Diagnosis not present

## 2023-06-05 DIAGNOSIS — Z6832 Body mass index (BMI) 32.0-32.9, adult: Secondary | ICD-10-CM | POA: Diagnosis not present

## 2023-06-05 DIAGNOSIS — Z8673 Personal history of transient ischemic attack (TIA), and cerebral infarction without residual deficits: Secondary | ICD-10-CM | POA: Diagnosis not present

## 2023-06-05 DIAGNOSIS — E7849 Other hyperlipidemia: Secondary | ICD-10-CM | POA: Diagnosis not present

## 2023-06-05 DIAGNOSIS — I69354 Hemiplegia and hemiparesis following cerebral infarction affecting left non-dominant side: Secondary | ICD-10-CM | POA: Diagnosis not present

## 2023-06-05 DIAGNOSIS — Z0000 Encounter for general adult medical examination without abnormal findings: Secondary | ICD-10-CM | POA: Diagnosis not present

## 2023-06-11 DIAGNOSIS — Z7982 Long term (current) use of aspirin: Secondary | ICD-10-CM | POA: Diagnosis not present

## 2023-06-11 DIAGNOSIS — G629 Polyneuropathy, unspecified: Secondary | ICD-10-CM | POA: Diagnosis not present

## 2023-06-11 DIAGNOSIS — J449 Chronic obstructive pulmonary disease, unspecified: Secondary | ICD-10-CM | POA: Diagnosis not present

## 2023-06-11 DIAGNOSIS — E785 Hyperlipidemia, unspecified: Secondary | ICD-10-CM | POA: Diagnosis not present

## 2023-06-11 DIAGNOSIS — F325 Major depressive disorder, single episode, in full remission: Secondary | ICD-10-CM | POA: Diagnosis not present

## 2023-06-11 DIAGNOSIS — F411 Generalized anxiety disorder: Secondary | ICD-10-CM | POA: Diagnosis not present

## 2023-06-11 DIAGNOSIS — I69351 Hemiplegia and hemiparesis following cerebral infarction affecting right dominant side: Secondary | ICD-10-CM | POA: Diagnosis not present

## 2023-06-11 DIAGNOSIS — Z8249 Family history of ischemic heart disease and other diseases of the circulatory system: Secondary | ICD-10-CM | POA: Diagnosis not present

## 2023-06-11 DIAGNOSIS — I2510 Atherosclerotic heart disease of native coronary artery without angina pectoris: Secondary | ICD-10-CM | POA: Diagnosis not present

## 2023-06-11 DIAGNOSIS — Z9181 History of falling: Secondary | ICD-10-CM | POA: Diagnosis not present

## 2023-06-11 DIAGNOSIS — I10 Essential (primary) hypertension: Secondary | ICD-10-CM | POA: Diagnosis not present

## 2023-06-13 ENCOUNTER — Encounter: Payer: Self-pay | Admitting: Adult Health

## 2023-07-22 DIAGNOSIS — I739 Peripheral vascular disease, unspecified: Secondary | ICD-10-CM | POA: Diagnosis not present

## 2023-08-01 ENCOUNTER — Encounter (HOSPITAL_COMMUNITY): Payer: Self-pay | Admitting: Interventional Radiology

## 2023-08-05 DIAGNOSIS — H52223 Regular astigmatism, bilateral: Secondary | ICD-10-CM | POA: Diagnosis not present

## 2023-08-20 DIAGNOSIS — N289 Disorder of kidney and ureter, unspecified: Secondary | ICD-10-CM | POA: Diagnosis not present

## 2023-08-20 DIAGNOSIS — E7800 Pure hypercholesterolemia, unspecified: Secondary | ICD-10-CM | POA: Diagnosis not present

## 2023-08-20 DIAGNOSIS — R1030 Lower abdominal pain, unspecified: Secondary | ICD-10-CM | POA: Diagnosis not present

## 2023-08-20 DIAGNOSIS — I10 Essential (primary) hypertension: Secondary | ICD-10-CM | POA: Diagnosis not present

## 2023-08-20 DIAGNOSIS — Z7902 Long term (current) use of antithrombotics/antiplatelets: Secondary | ICD-10-CM | POA: Diagnosis not present

## 2023-08-20 DIAGNOSIS — R112 Nausea with vomiting, unspecified: Secondary | ICD-10-CM | POA: Diagnosis not present

## 2023-08-20 DIAGNOSIS — E876 Hypokalemia: Secondary | ICD-10-CM | POA: Diagnosis not present

## 2023-08-20 DIAGNOSIS — Z79899 Other long term (current) drug therapy: Secondary | ICD-10-CM | POA: Diagnosis not present

## 2023-08-20 DIAGNOSIS — N201 Calculus of ureter: Secondary | ICD-10-CM | POA: Diagnosis not present

## 2023-08-20 DIAGNOSIS — E279 Disorder of adrenal gland, unspecified: Secondary | ICD-10-CM | POA: Diagnosis not present

## 2023-08-20 DIAGNOSIS — N132 Hydronephrosis with renal and ureteral calculous obstruction: Secondary | ICD-10-CM | POA: Diagnosis not present

## 2023-08-20 DIAGNOSIS — N838 Other noninflammatory disorders of ovary, fallopian tube and broad ligament: Secondary | ICD-10-CM | POA: Diagnosis not present

## 2023-08-20 DIAGNOSIS — Z7982 Long term (current) use of aspirin: Secondary | ICD-10-CM | POA: Diagnosis not present

## 2023-08-20 DIAGNOSIS — Z8673 Personal history of transient ischemic attack (TIA), and cerebral infarction without residual deficits: Secondary | ICD-10-CM | POA: Diagnosis not present

## 2023-08-28 DIAGNOSIS — N23 Unspecified renal colic: Secondary | ICD-10-CM | POA: Diagnosis not present

## 2023-08-28 DIAGNOSIS — Z6832 Body mass index (BMI) 32.0-32.9, adult: Secondary | ICD-10-CM | POA: Diagnosis not present

## 2023-09-14 ENCOUNTER — Encounter: Payer: Self-pay | Admitting: Adult Health

## 2023-10-02 ENCOUNTER — Other Ambulatory Visit (HOSPITAL_COMMUNITY): Payer: Self-pay | Admitting: Internal Medicine

## 2023-10-02 DIAGNOSIS — Z1231 Encounter for screening mammogram for malignant neoplasm of breast: Secondary | ICD-10-CM

## 2023-10-07 DIAGNOSIS — R319 Hematuria, unspecified: Secondary | ICD-10-CM | POA: Diagnosis not present

## 2023-10-07 DIAGNOSIS — N390 Urinary tract infection, site not specified: Secondary | ICD-10-CM | POA: Diagnosis not present

## 2023-10-07 DIAGNOSIS — N201 Calculus of ureter: Secondary | ICD-10-CM | POA: Diagnosis not present

## 2023-10-07 DIAGNOSIS — E7800 Pure hypercholesterolemia, unspecified: Secondary | ICD-10-CM | POA: Diagnosis not present

## 2023-10-07 DIAGNOSIS — N23 Unspecified renal colic: Secondary | ICD-10-CM | POA: Diagnosis not present

## 2023-10-07 DIAGNOSIS — N132 Hydronephrosis with renal and ureteral calculous obstruction: Secondary | ICD-10-CM | POA: Diagnosis not present

## 2023-10-07 DIAGNOSIS — I10 Essential (primary) hypertension: Secondary | ICD-10-CM | POA: Diagnosis not present

## 2023-10-07 DIAGNOSIS — R109 Unspecified abdominal pain: Secondary | ICD-10-CM | POA: Diagnosis not present

## 2023-10-07 DIAGNOSIS — J449 Chronic obstructive pulmonary disease, unspecified: Secondary | ICD-10-CM | POA: Diagnosis not present

## 2023-10-07 DIAGNOSIS — N281 Cyst of kidney, acquired: Secondary | ICD-10-CM | POA: Diagnosis not present

## 2023-10-08 ENCOUNTER — Encounter (INDEPENDENT_AMBULATORY_CARE_PROVIDER_SITE_OTHER): Payer: Self-pay | Admitting: *Deleted

## 2023-10-09 ENCOUNTER — Ambulatory Visit (HOSPITAL_COMMUNITY)
Admission: RE | Admit: 2023-10-09 | Discharge: 2023-10-09 | Disposition: A | Discharge status: Home / Self Care | Source: Ambulatory Visit

## 2023-10-09 ENCOUNTER — Inpatient Hospital Stay
Admission: RE | Admit: 2023-10-09 | Discharge: 2023-10-09 | Disposition: A | Discharge status: Home / Self Care | Payer: Self-pay | Source: Ambulatory Visit | Attending: Internal Medicine | Admitting: Internal Medicine

## 2023-10-09 ENCOUNTER — Encounter (HOSPITAL_COMMUNITY): Payer: Self-pay

## 2023-10-09 ENCOUNTER — Other Ambulatory Visit (HOSPITAL_COMMUNITY): Payer: Self-pay | Admitting: Internal Medicine

## 2023-10-09 DIAGNOSIS — Z1231 Encounter for screening mammogram for malignant neoplasm of breast: Secondary | ICD-10-CM | POA: Diagnosis not present

## 2023-10-24 ENCOUNTER — Encounter: Payer: Self-pay | Admitting: Adult Health

## 2023-10-28 DIAGNOSIS — Z8673 Personal history of transient ischemic attack (TIA), and cerebral infarction without residual deficits: Secondary | ICD-10-CM | POA: Diagnosis not present

## 2023-10-28 DIAGNOSIS — I10 Essential (primary) hypertension: Secondary | ICD-10-CM | POA: Diagnosis not present

## 2023-10-28 DIAGNOSIS — I69354 Hemiplegia and hemiparesis following cerebral infarction affecting left non-dominant side: Secondary | ICD-10-CM | POA: Diagnosis not present

## 2023-10-28 DIAGNOSIS — Z0000 Encounter for general adult medical examination without abnormal findings: Secondary | ICD-10-CM | POA: Diagnosis not present

## 2023-10-28 DIAGNOSIS — E7849 Other hyperlipidemia: Secondary | ICD-10-CM | POA: Diagnosis not present

## 2023-10-28 DIAGNOSIS — Z6832 Body mass index (BMI) 32.0-32.9, adult: Secondary | ICD-10-CM | POA: Diagnosis not present

## 2023-10-28 DIAGNOSIS — N23 Unspecified renal colic: Secondary | ICD-10-CM | POA: Diagnosis not present

## 2023-10-28 NOTE — Telephone Encounter (Signed)
 Can letter be drafted regarding her residual deficits from stroke and inability to return to work. Will be similar to what was previously reported on LTD forms from 2023. Thank you.

## 2023-10-29 ENCOUNTER — Encounter: Payer: Self-pay | Admitting: Adult Health

## 2023-10-29 NOTE — Telephone Encounter (Signed)
 NP has sent pt letter via mychart

## 2023-11-12 DIAGNOSIS — I69354 Hemiplegia and hemiparesis following cerebral infarction affecting left non-dominant side: Secondary | ICD-10-CM | POA: Diagnosis not present

## 2023-11-12 DIAGNOSIS — I671 Cerebral aneurysm, nonruptured: Secondary | ICD-10-CM | POA: Diagnosis not present

## 2023-11-27 ENCOUNTER — Other Ambulatory Visit: Payer: Self-pay

## 2023-11-27 DIAGNOSIS — N200 Calculus of kidney: Secondary | ICD-10-CM

## 2023-11-28 ENCOUNTER — Ambulatory Visit (HOSPITAL_COMMUNITY)
Admission: RE | Admit: 2023-11-28 | Discharge: 2023-11-28 | Disposition: A | Discharge status: Home / Self Care | Source: Ambulatory Visit | Attending: Urology | Admitting: Urology

## 2023-11-28 ENCOUNTER — Ambulatory Visit (INDEPENDENT_AMBULATORY_CARE_PROVIDER_SITE_OTHER): Admitting: Urology

## 2023-11-28 ENCOUNTER — Other Ambulatory Visit: Payer: Self-pay

## 2023-11-28 ENCOUNTER — Encounter: Payer: Self-pay | Admitting: Urology

## 2023-11-28 VITALS — BP 124/82 | HR 75

## 2023-11-28 DIAGNOSIS — N200 Calculus of kidney: Secondary | ICD-10-CM

## 2023-11-28 DIAGNOSIS — N201 Calculus of ureter: Secondary | ICD-10-CM | POA: Diagnosis not present

## 2023-11-28 LAB — URINALYSIS, ROUTINE W REFLEX MICROSCOPIC
Bilirubin, UA: NEGATIVE
Glucose, UA: NEGATIVE
Ketones, UA: NEGATIVE
Nitrite, UA: NEGATIVE
Protein,UA: NEGATIVE
Specific Gravity, UA: 1.01 (ref 1.005–1.030)
Urobilinogen, Ur: 0.2 mg/dL (ref 0.2–1.0)
pH, UA: 6 (ref 5.0–7.5)

## 2023-11-28 LAB — MICROSCOPIC EXAMINATION

## 2023-11-28 MED ORDER — ONDANSETRON HCL 4 MG PO TABS: 4.0000 mg | ORAL_TABLET | Freq: Three times a day (TID) | ORAL | 1 refills | Status: DC | PRN

## 2023-11-28 MED ORDER — TAMSULOSIN HCL 0.4 MG PO CAPS: 0.4000 mg | ORAL_CAPSULE | Freq: Every day | ORAL | 1 refills | Status: DC

## 2023-11-28 MED ORDER — HYDROCODONE-ACETAMINOPHEN 5-325 MG PO TABS: 1.0000 | ORAL_TABLET | Freq: Four times a day (QID) | ORAL | 0 refills | Status: DC | PRN

## 2023-11-28 NOTE — H&P (View-Only) (Signed)
 11/28/2023 8:57 AM   Randine Rakers 1965-12-23 [id number]  Referring provider: Orpha Yancey LABOR, MD [address] [place],  [place] [address]  nephrolithiasis   HPI: Ms Lampson is a 58yo here for evaluation of nephrolithiasis. Starting 2 months ago she developed left flank pain. She underwent CT 10/2023 which showed an 8mm left proximal ureteral calculus. She has not passed her calculus. She continues to have intermittent left flank pain. KUB from today shows 8mm left mid ureteral calculus. He last stone event was 20 years ago.    PMH: Past Medical History:  Diagnosis Date   Anxiety    COPD (chronic obstructive pulmonary disease) (HCC)    mild - no inhaler   Dizziness    Fibromyalgia    Headache    HLD (hyperlipidemia)    Hypertension    IBS (irritable bowel syndrome)    Insomnia    Stroke (HCC) 06/27/2019    Surgical History: Past Surgical History:  Procedure Laterality Date   CHOLECYSTECTOMY  2003   COLONOSCOPY     IR 3D INDEPENDENT WKST  11/23/2019   IR ANGIO INTRA EXTRACRAN SEL COM CAROTID INNOMINATE BILAT MOD SED  11/23/2019   IR ANGIO INTRA EXTRACRAN SEL INTERNAL CAROTID UNI L MOD SED  12/06/2019   IR ANGIO VERTEBRAL SEL SUBCLAVIAN INNOMINATE UNI R MOD SED  11/23/2019   IR ANGIO VERTEBRAL SEL VERTEBRAL UNI L MOD SED  11/23/2019   IR ANGIOGRAM FOLLOW UP STUDY  12/06/2019   IR RADIOLOGIST EVAL & MGMT  01/12/2020   IR TRANSCATH/EMBOLIZ  12/06/2019   LAPAROSCOPY  2003   removal of kidney cyst   RADIOLOGY WITH ANESTHESIA N/A 12/06/2019   Procedure: IR WITH ANESTHESIA EMBOLIZATION;  Surgeon: Dolphus Carrion, MD;  Location: [facility] OR;  Service: Radiology;  Laterality: N/A;   vaginal hysterectiny  04/1999    Home Medications:  Allergies as of 11/28/2023       Reactions   Duloxetine Hcl Other (See Comments)   Urinary retention         Medication List        Accurate as of November 28, 2023  8:57 AM. If you have any questions, ask your nurse or doctor.           aspirin  EC 81 MG tablet Take 4 tablets (325 mg total) by mouth daily.   atorvastatin  80 MG tablet Commonly known as: LIPITOR  Take 1 tablet (80 mg total) by mouth daily.   butalbital -acetaminophen -caffeine  50-325-40 MG tablet Commonly known as: FIORICET Take 1 tablet by mouth every 6 (six) hours as needed for headache. No more than 2-3 tablets in a week.   escitalopram 10 MG tablet Commonly known as: LEXAPRO Take 10 mg by mouth daily.   gabapentin  300 MG capsule Commonly known as: NEURONTIN  Take 2 capsules (600 mg total) by mouth at bedtime.   lidocaine -prilocaine  cream Commonly known as: EMLA  Apply 1 Application topically as needed.   Meclizine  HCl 25 MG Chew Chew 25 mg by mouth 3 (three) times daily as needed (vertigo).   metoprolol succinate 50 MG 24 hr tablet Commonly known as: TOPROL-XL Take 50 mg by mouth daily.   metoprolol tartrate 25 MG tablet Commonly known as: LOPRESSOR Take 25 mg by mouth 2 (two) times daily.   Nurtec 75 MG Tbdp Generic drug: Rimegepant Sulfate TAKE 1 TABLET (75 MG TOTAL) BY MOUTH AS NEEDED.   olmesartan 20 MG tablet Commonly known as: BENICAR Take 20 mg by mouth daily.   ondansetron   4 MG tablet Commonly known as: ZOFRAN  Take 4 mg by mouth every 8 (eight) hours as needed for nausea or vomiting.   ONE-A-DAY VITACRAVES ADULT PO Take by mouth.   predniSONE  20 MG tablet Commonly known as: DELTASONE  Take 2 tablets (40 mg total) by mouth daily with breakfast. For the next four days   tamsulosin 0.4 MG Caps capsule Commonly known as: FLOMAX Take 0.4 mg by mouth daily.   tiZANidine  2 MG tablet Commonly known as: ZANAFLEX  Take by mouth every 6 (six) hours as needed for muscle spasms.        Allergies:  Allergies  Allergen Reactions   Duloxetine Hcl Other (See Comments)    Urinary retention     Family History: Family History  Problem Relation Age of Onset   Stroke Mother     Social History:  reports that she is a  non-smoker but has been exposed to tobacco smoke. She has never used smokeless tobacco. She reports that she does not currently use alcohol. She reports that she does not use drugs.  ROS: All other review of systems were reviewed and are negative except what is noted above in HPI  Physical Exam: BP 124/82   Pulse 75   LMP  (LMP Unknown)   Constitutional:  Alert and oriented, No acute distress. HEENT: [place] AT, moist mucus membranes.  Trachea midline, no masses. Cardiovascular: No clubbing, cyanosis, or edema. Respiratory: Normal respiratory effort, no increased work of breathing. GI: Abdomen is soft, nontender, nondistended, no abdominal masses GU: No CVA tenderness.  Lymph: No cervical or inguinal lymphadenopathy. Skin: No rashes, bruises or suspicious lesions. Neurologic: Grossly intact, no focal deficits, moving all 4 extremities. Psychiatric: Normal mood and affect.  Laboratory Data: Lab Results  Component Value Date   WBC 12.0 (H) 12/07/2019   HGB 13.2 12/07/2019   HCT 40.0 12/07/2019   MCV 91.3 12/07/2019   PLT 327 12/07/2019    Lab Results  Component Value Date   CREATININE 0.90 06/04/2021    No results found for: PSA  No results found for: TESTOSTERONE  Lab Results  Component Value Date   HGBA1C 5.3 07/03/2019    Urinalysis No results found for: COLORURINE, APPEARANCEUR, LABSPEC, PHURINE, GLUCOSEU, HGBUR, BILIRUBINUR, KETONESUR, PROTEINUR, UROBILINOGEN, NITRITE, LEUKOCYTESUR  No results found for: LABMICR, WBCUA, RBCUA, LABEPIT, MUCUS, BACTERIA  Pertinent Imaging: KUb today: Images reviewed and discussed with the patient  No results found for this or any previous visit.  No results found for this or any previous visit.  No results found for this or any previous visit.  No results found for this or any previous visit.  No results found for this or any previous visit.  No results found for this or any previous  visit.  No results found for this or any previous visit.  No results found for this or any previous visit.   Assessment & Plan:    1. Kidney stone (Primary) -We discussed the management of kidney stones. These options include observation, ureteroscopy, shockwave lithotripsy (ESWL) and percutaneous nephrolithotomy (PCNL). We discussed which options are relevant to the patient's stone(s). We discussed the natural history of kidney stones as well as the complications of untreated stones and the impact on quality of life without treatment as well as with each of the above listed treatments. We also discussed the efficacy of each treatment in its ability to clear the stone burden. With any of these management options I discussed the signs and symptoms of infection  and the need for emergent treatment should these be experienced. For each option we discussed the ability of each procedure to clear the patient of their stone burden.   For observation I described the risks which include but are not limited to silent renal damage, life-threatening infection, need for emergent surgery, failure to pass stone and pain.   For ureteroscopy I described the risks which include bleeding, infection, damage to contiguous structures, positioning injury, ureteral stricture, ureteral avulsion, ureteral injury, need for prolonged ureteral stent, inability to perform ureteroscopy, need for an interval procedure, inability to clear stone burden, stent discomfort/pain, heart attack, stroke, pulmonary embolus and the inherent risks with general anesthesia.   For shockwave lithotripsy I described the risks which include arrhythmia, kidney contusion, kidney hemorrhage, need for transfusion, pain, inability to adequately break up stone, inability to pass stone fragments, Steinstrasse, infection associated with obstructing stones, need for alternate surgical procedure, need for repeat shockwave lithotripsy, MI, CVA, PE and the  inherent risks with anesthesia/conscious sedation.   For PCNL I described the risks including positioning injury, pneumothorax, hydrothorax, need for chest tube, inability to clear stone burden, renal laceration, arterial venous fistula or malformation, need for embolization of kidney, loss of kidney or renal function, need for repeat procedure, need for prolonged nephrostomy tube, ureteral avulsion, MI, CVA, PE and the inherent risks of general anesthesia.   - The patient would like to proceed with left ESWL  - Urinalysis, Routine w reflex microscopic   No follow-ups on file.  [name], MD  [facility]

## 2023-11-28 NOTE — Patient Instructions (Signed)

## 2023-11-28 NOTE — Progress Notes (Signed)
 11/28/2023 8:57 AM   Randine Rakers 1965-12-23 [id number]  Referring provider: Orpha Yancey LABOR, MD [address] [place],  [place] [address]  nephrolithiasis   HPI: Ms Lampson is a 58yo here for evaluation of nephrolithiasis. Starting 2 months ago she developed left flank pain. She underwent CT 10/2023 which showed an 8mm left proximal ureteral calculus. She has not passed her calculus. She continues to have intermittent left flank pain. KUB from today shows 8mm left mid ureteral calculus. He last stone event was 20 years ago.    PMH: Past Medical History:  Diagnosis Date   Anxiety    COPD (chronic obstructive pulmonary disease) (HCC)    mild - no inhaler   Dizziness    Fibromyalgia    Headache    HLD (hyperlipidemia)    Hypertension    IBS (irritable bowel syndrome)    Insomnia    Stroke (HCC) 06/27/2019    Surgical History: Past Surgical History:  Procedure Laterality Date   CHOLECYSTECTOMY  2003   COLONOSCOPY     IR 3D INDEPENDENT WKST  11/23/2019   IR ANGIO INTRA EXTRACRAN SEL COM CAROTID INNOMINATE BILAT MOD SED  11/23/2019   IR ANGIO INTRA EXTRACRAN SEL INTERNAL CAROTID UNI L MOD SED  12/06/2019   IR ANGIO VERTEBRAL SEL SUBCLAVIAN INNOMINATE UNI R MOD SED  11/23/2019   IR ANGIO VERTEBRAL SEL VERTEBRAL UNI L MOD SED  11/23/2019   IR ANGIOGRAM FOLLOW UP STUDY  12/06/2019   IR RADIOLOGIST EVAL & MGMT  01/12/2020   IR TRANSCATH/EMBOLIZ  12/06/2019   LAPAROSCOPY  2003   removal of kidney cyst   RADIOLOGY WITH ANESTHESIA N/A 12/06/2019   Procedure: IR WITH ANESTHESIA EMBOLIZATION;  Surgeon: Dolphus Carrion, MD;  Location: [facility] OR;  Service: Radiology;  Laterality: N/A;   vaginal hysterectiny  04/1999    Home Medications:  Allergies as of 11/28/2023       Reactions   Duloxetine Hcl Other (See Comments)   Urinary retention         Medication List        Accurate as of November 28, 2023  8:57 AM. If you have any questions, ask your nurse or doctor.           aspirin  EC 81 MG tablet Take 4 tablets (325 mg total) by mouth daily.   atorvastatin  80 MG tablet Commonly known as: LIPITOR  Take 1 tablet (80 mg total) by mouth daily.   butalbital -acetaminophen -caffeine  50-325-40 MG tablet Commonly known as: FIORICET Take 1 tablet by mouth every 6 (six) hours as needed for headache. No more than 2-3 tablets in a week.   escitalopram 10 MG tablet Commonly known as: LEXAPRO Take 10 mg by mouth daily.   gabapentin  300 MG capsule Commonly known as: NEURONTIN  Take 2 capsules (600 mg total) by mouth at bedtime.   lidocaine -prilocaine  cream Commonly known as: EMLA  Apply 1 Application topically as needed.   Meclizine  HCl 25 MG Chew Chew 25 mg by mouth 3 (three) times daily as needed (vertigo).   metoprolol succinate 50 MG 24 hr tablet Commonly known as: TOPROL-XL Take 50 mg by mouth daily.   metoprolol tartrate 25 MG tablet Commonly known as: LOPRESSOR Take 25 mg by mouth 2 (two) times daily.   Nurtec 75 MG Tbdp Generic drug: Rimegepant Sulfate TAKE 1 TABLET (75 MG TOTAL) BY MOUTH AS NEEDED.   olmesartan 20 MG tablet Commonly known as: BENICAR Take 20 mg by mouth daily.   ondansetron   4 MG tablet Commonly known as: ZOFRAN  Take 4 mg by mouth every 8 (eight) hours as needed for nausea or vomiting.   ONE-A-DAY VITACRAVES ADULT PO Take by mouth.   predniSONE  20 MG tablet Commonly known as: DELTASONE  Take 2 tablets (40 mg total) by mouth daily with breakfast. For the next four days   tamsulosin 0.4 MG Caps capsule Commonly known as: FLOMAX Take 0.4 mg by mouth daily.   tiZANidine  2 MG tablet Commonly known as: ZANAFLEX  Take by mouth every 6 (six) hours as needed for muscle spasms.        Allergies:  Allergies  Allergen Reactions   Duloxetine Hcl Other (See Comments)    Urinary retention     Family History: Family History  Problem Relation Age of Onset   Stroke Mother     Social History:  reports that she is a  non-smoker but has been exposed to tobacco smoke. She has never used smokeless tobacco. She reports that she does not currently use alcohol. She reports that she does not use drugs.  ROS: All other review of systems were reviewed and are negative except what is noted above in HPI  Physical Exam: BP 124/82   Pulse 75   LMP  (LMP Unknown)   Constitutional:  Alert and oriented, No acute distress. HEENT: [place] AT, moist mucus membranes.  Trachea midline, no masses. Cardiovascular: No clubbing, cyanosis, or edema. Respiratory: Normal respiratory effort, no increased work of breathing. GI: Abdomen is soft, nontender, nondistended, no abdominal masses GU: No CVA tenderness.  Lymph: No cervical or inguinal lymphadenopathy. Skin: No rashes, bruises or suspicious lesions. Neurologic: Grossly intact, no focal deficits, moving all 4 extremities. Psychiatric: Normal mood and affect.  Laboratory Data: Lab Results  Component Value Date   WBC 12.0 (H) 12/07/2019   HGB 13.2 12/07/2019   HCT 40.0 12/07/2019   MCV 91.3 12/07/2019   PLT 327 12/07/2019    Lab Results  Component Value Date   CREATININE 0.90 06/04/2021    No results found for: PSA  No results found for: TESTOSTERONE  Lab Results  Component Value Date   HGBA1C 5.3 07/03/2019    Urinalysis No results found for: COLORURINE, APPEARANCEUR, LABSPEC, PHURINE, GLUCOSEU, HGBUR, BILIRUBINUR, KETONESUR, PROTEINUR, UROBILINOGEN, NITRITE, LEUKOCYTESUR  No results found for: LABMICR, WBCUA, RBCUA, LABEPIT, MUCUS, BACTERIA  Pertinent Imaging: KUb today: Images reviewed and discussed with the patient  No results found for this or any previous visit.  No results found for this or any previous visit.  No results found for this or any previous visit.  No results found for this or any previous visit.  No results found for this or any previous visit.  No results found for this or any previous  visit.  No results found for this or any previous visit.  No results found for this or any previous visit.   Assessment & Plan:    1. Kidney stone (Primary) -We discussed the management of kidney stones. These options include observation, ureteroscopy, shockwave lithotripsy (ESWL) and percutaneous nephrolithotomy (PCNL). We discussed which options are relevant to the patient's stone(s). We discussed the natural history of kidney stones as well as the complications of untreated stones and the impact on quality of life without treatment as well as with each of the above listed treatments. We also discussed the efficacy of each treatment in its ability to clear the stone burden. With any of these management options I discussed the signs and symptoms of infection  and the need for emergent treatment should these be experienced. For each option we discussed the ability of each procedure to clear the patient of their stone burden.   For observation I described the risks which include but are not limited to silent renal damage, life-threatening infection, need for emergent surgery, failure to pass stone and pain.   For ureteroscopy I described the risks which include bleeding, infection, damage to contiguous structures, positioning injury, ureteral stricture, ureteral avulsion, ureteral injury, need for prolonged ureteral stent, inability to perform ureteroscopy, need for an interval procedure, inability to clear stone burden, stent discomfort/pain, heart attack, stroke, pulmonary embolus and the inherent risks with general anesthesia.   For shockwave lithotripsy I described the risks which include arrhythmia, kidney contusion, kidney hemorrhage, need for transfusion, pain, inability to adequately break up stone, inability to pass stone fragments, Steinstrasse, infection associated with obstructing stones, need for alternate surgical procedure, need for repeat shockwave lithotripsy, MI, CVA, PE and the  inherent risks with anesthesia/conscious sedation.   For PCNL I described the risks including positioning injury, pneumothorax, hydrothorax, need for chest tube, inability to clear stone burden, renal laceration, arterial venous fistula or malformation, need for embolization of kidney, loss of kidney or renal function, need for repeat procedure, need for prolonged nephrostomy tube, ureteral avulsion, MI, CVA, PE and the inherent risks of general anesthesia.   - The patient would like to proceed with left ESWL  - Urinalysis, Routine w reflex microscopic   No follow-ups on file.  [name], MD  [facility]

## 2023-12-05 ENCOUNTER — Encounter (HOSPITAL_COMMUNITY)
Admission: RE | Admit: 2023-12-05 | Discharge: 2023-12-05 | Disposition: A | Discharge status: Home / Self Care | Source: Ambulatory Visit | Attending: Urology | Admitting: Urology

## 2023-12-08 ENCOUNTER — Encounter (HOSPITAL_COMMUNITY): Payer: Self-pay

## 2023-12-08 NOTE — Progress Notes (Signed)
 Spoke w/ patient via phone for pre-op interview Lab needs dos none       Lab results------ COVID test -----patient states asymptomatic no test needed Arrive at 0600 NPO after MN NO Solid Food.  Pre-Surgery Ensure or G2: none  Med rec completed Medications to take morning of surgery Lesapro, gabapentin , metoprolol, flomax Diabetic medication N/A  GLP1 agonist last dose:N/A GLP1 instructions:N/A  Patient instructed no nail polish to be worn day of surgery Patient instructed to bring photo id and insurance card day of surgery Patient aware to have Driver (ride ) / caregiver    for 24 hours after surgery - caregiver will be Gordy Rakers  Patient Special Instructions bring blue folder Pre-Op special Instructions N/A  Patient verbalized understanding of instructions that were given at this phone interview. Patient denies chest pain, sob, fever, cough at the interview.

## 2023-12-09 ENCOUNTER — Other Ambulatory Visit: Payer: Self-pay

## 2023-12-09 ENCOUNTER — Ambulatory Visit (HOSPITAL_COMMUNITY)

## 2023-12-09 ENCOUNTER — Ambulatory Visit (HOSPITAL_COMMUNITY)
Admission: RE | Admit: 2023-12-09 | Discharge: 2023-12-09 | Disposition: A | Discharge status: Home / Self Care | Attending: Urology | Admitting: Urology

## 2023-12-09 ENCOUNTER — Encounter (HOSPITAL_COMMUNITY)
Admission: RE | Disposition: A | Discharge status: Home / Self Care | Payer: Self-pay | Source: Home / Self Care | Attending: Urology

## 2023-12-09 ENCOUNTER — Encounter (HOSPITAL_COMMUNITY): Payer: Self-pay | Admitting: Urology

## 2023-12-09 DIAGNOSIS — E669 Obesity, unspecified: Secondary | ICD-10-CM | POA: Diagnosis not present

## 2023-12-09 DIAGNOSIS — Z7982 Long term (current) use of aspirin: Secondary | ICD-10-CM | POA: Diagnosis not present

## 2023-12-09 DIAGNOSIS — Z8673 Personal history of transient ischemic attack (TIA), and cerebral infarction without residual deficits: Secondary | ICD-10-CM | POA: Insufficient documentation

## 2023-12-09 DIAGNOSIS — Z7722 Contact with and (suspected) exposure to environmental tobacco smoke (acute) (chronic): Secondary | ICD-10-CM | POA: Diagnosis not present

## 2023-12-09 DIAGNOSIS — N201 Calculus of ureter: Secondary | ICD-10-CM | POA: Diagnosis present

## 2023-12-09 DIAGNOSIS — Z6833 Body mass index (BMI) 33.0-33.9, adult: Secondary | ICD-10-CM | POA: Diagnosis not present

## 2023-12-09 DIAGNOSIS — M797 Fibromyalgia: Secondary | ICD-10-CM | POA: Insufficient documentation

## 2023-12-09 DIAGNOSIS — I10 Essential (primary) hypertension: Secondary | ICD-10-CM | POA: Insufficient documentation

## 2023-12-09 HISTORY — PX: EXTRACORPOREAL SHOCK WAVE LITHOTRIPSY: SHX1557

## 2023-12-09 SURGERY — LITHOTRIPSY, ESWL
Anesthesia: LOCAL | Laterality: Left

## 2023-12-09 MED ORDER — SODIUM CHLORIDE 0.9 % IV SOLN: INTRAVENOUS | Status: DC

## 2023-12-09 MED ORDER — HYDROCODONE-ACETAMINOPHEN 5-325 MG PO TABS: 1.0000 | ORAL_TABLET | Freq: Four times a day (QID) | ORAL | 0 refills | Status: AC | PRN

## 2023-12-09 MED ORDER — TAMSULOSIN HCL 0.4 MG PO CAPS: 0.4000 mg | ORAL_CAPSULE | Freq: Every day | ORAL | 1 refills | Status: AC

## 2023-12-09 MED ORDER — DIPHENHYDRAMINE HCL 25 MG PO CAPS
25.0000 mg | ORAL_CAPSULE | ORAL | Status: AC
  Administered 2023-12-09: 25 mg via ORAL

## 2023-12-09 MED ORDER — ONDANSETRON HCL 4 MG PO TABS: 4.0000 mg | ORAL_TABLET | Freq: Three times a day (TID) | ORAL | 1 refills | Status: AC | PRN

## 2023-12-09 MED ORDER — DIAZEPAM 5 MG PO TABS
10.0000 mg | ORAL_TABLET | Freq: Once | ORAL | Status: AC
  Administered 2023-12-09: 10 mg via ORAL

## 2023-12-09 NOTE — Interval H&P Note (Signed)
 History and Physical Interval Note:  12/09/2023 8:27 AM  Kimberly [name]  has presented today for surgery, with the diagnosis of Left Ureteral Stone.  The various methods of treatment have been discussed with the patient and family. After consideration of risks, benefits and other options for treatment, the patient has consented to  Procedure(s): LITHOTRIPSY, ESWL (Left) as a surgical intervention.  The patient's history has been reviewed, patient examined, no change in status, stable for surgery.  I have reviewed the patient's chart and labs.  Questions were answered to the patient's satisfaction.     [name]

## 2023-12-10 ENCOUNTER — Encounter (HOSPITAL_COMMUNITY): Payer: Self-pay | Admitting: Urology

## 2023-12-11 ENCOUNTER — Encounter (HOSPITAL_COMMUNITY): Payer: Self-pay | Admitting: Urology

## 2023-12-17 ENCOUNTER — Other Ambulatory Visit: Payer: Self-pay

## 2023-12-17 DIAGNOSIS — N200 Calculus of kidney: Secondary | ICD-10-CM

## 2023-12-18 ENCOUNTER — Other Ambulatory Visit

## 2023-12-18 DIAGNOSIS — N200 Calculus of kidney: Secondary | ICD-10-CM

## 2023-12-29 ENCOUNTER — Telehealth: Payer: Self-pay

## 2023-12-29 NOTE — Telephone Encounter (Signed)
 My chart message sent

## 2023-12-30 LAB — STONE ANALYSIS
Calcium Oxalate Monohydrate: 100 %
Weight Calculi: 65 mg

## 2024-01-07 ENCOUNTER — Encounter: Admitting: Urology

## 2024-02-09 ENCOUNTER — Other Ambulatory Visit (HOSPITAL_COMMUNITY): Payer: Self-pay

## 2024-02-12 ENCOUNTER — Other Ambulatory Visit: Payer: Self-pay | Admitting: Adult Health

## 2024-02-13 NOTE — Telephone Encounter (Signed)
 Last seen on 04/29/23 Follow up scheduled on 05/04/24

## 2024-05-04 ENCOUNTER — Ambulatory Visit: Admitting: Adult Health
